# Patient Record
Sex: Female | Born: 1950
Health system: Southern US, Community
[De-identification: ages and names within clinical notes are randomized; demographics above are authoritative.]

## PROBLEM LIST (undated history)

## (undated) DIAGNOSIS — K219 Gastro-esophageal reflux disease without esophagitis: Secondary | ICD-10-CM

## (undated) DIAGNOSIS — K210 Gastro-esophageal reflux disease with esophagitis: Secondary | ICD-10-CM

## (undated) DIAGNOSIS — I21A1 Myocardial infarction type 2: Secondary | ICD-10-CM

## (undated) DIAGNOSIS — E871 Hypo-osmolality and hyponatremia: Secondary | ICD-10-CM

## (undated) DIAGNOSIS — D735 Infarction of spleen: Secondary | ICD-10-CM

## (undated) DIAGNOSIS — E785 Hyperlipidemia, unspecified: Secondary | ICD-10-CM

## (undated) DIAGNOSIS — M545 Low back pain, unspecified: Secondary | ICD-10-CM

## (undated) DIAGNOSIS — R079 Chest pain, unspecified: Secondary | ICD-10-CM

## (undated) DIAGNOSIS — I639 Cerebral infarction, unspecified: Secondary | ICD-10-CM

## (undated) DIAGNOSIS — F419 Anxiety disorder, unspecified: Secondary | ICD-10-CM

## (undated) DIAGNOSIS — I2699 Other pulmonary embolism without acute cor pulmonale: Secondary | ICD-10-CM

## (undated) DIAGNOSIS — I4891 Unspecified atrial fibrillation: Secondary | ICD-10-CM

## (undated) DIAGNOSIS — D649 Anemia, unspecified: Secondary | ICD-10-CM

## (undated) DIAGNOSIS — M25569 Pain in unspecified knee: Secondary | ICD-10-CM

## (undated) DIAGNOSIS — E559 Vitamin D deficiency, unspecified: Secondary | ICD-10-CM

## (undated) DIAGNOSIS — M810 Age-related osteoporosis without current pathological fracture: Secondary | ICD-10-CM

## (undated) DIAGNOSIS — I495 Sick sinus syndrome: Secondary | ICD-10-CM

## (undated) DIAGNOSIS — I63511 Cerebral infarction due to unspecified occlusion or stenosis of right middle cerebral artery: Secondary | ICD-10-CM

## (undated) DIAGNOSIS — E119 Type 2 diabetes mellitus without complications: Secondary | ICD-10-CM

## (undated) DIAGNOSIS — J45909 Unspecified asthma, uncomplicated: Secondary | ICD-10-CM

## (undated) DIAGNOSIS — E1140 Type 2 diabetes mellitus with diabetic neuropathy, unspecified: Secondary | ICD-10-CM

## (undated) HISTORY — DX: Unspecified atrial fibrillation: I48.91

## (undated) HISTORY — DX: Hyperlipidemia, unspecified: E78.5

## (undated) HISTORY — DX: Low back pain, unspecified: M54.50

## (undated) HISTORY — DX: Type 2 diabetes mellitus without complications: E11.9

## (undated) HISTORY — DX: Pain in unspecified knee: M25.569

## (undated) HISTORY — DX: Sick sinus syndrome: I49.5

## (undated) HISTORY — DX: Unspecified asthma, uncomplicated: J45.909

## (undated) HISTORY — DX: Cerebral infarction, unspecified: I63.9

## (undated) HISTORY — DX: Anxiety disorder, unspecified: F41.9

## (undated) HISTORY — PX: HERNIA REPAIR: SHX51

## (undated) HISTORY — DX: Infarction of spleen: D73.5

## (undated) HISTORY — DX: Age-related osteoporosis without current pathological fracture: M81.0

## (undated) HISTORY — DX: Hypo-osmolality and hyponatremia: E87.1

## (undated) HISTORY — DX: Myocardial infarction type 2: I21.A1

## (undated) HISTORY — DX: Other pulmonary embolism without acute cor pulmonale: I26.99

## (undated) HISTORY — DX: Cerebral infarction due to unspecified occlusion or stenosis of right middle cerebral artery: I63.511

## (undated) HISTORY — DX: Low back pain: M54.5

## (undated) HISTORY — DX: Anemia, unspecified: D64.9

## (undated) HISTORY — DX: Gastro-esophageal reflux disease with esophagitis: K21.0

## (undated) HISTORY — DX: Type 2 diabetes mellitus with diabetic neuropathy, unspecified: E11.40

## (undated) HISTORY — DX: Vitamin D deficiency, unspecified: E55.9

## (undated) HISTORY — DX: Chest pain, unspecified: R07.9

## (undated) HISTORY — DX: Gastro-esophageal reflux disease without esophagitis: K21.9

---

## 1991-01-24 HISTORY — PX: ARM AMPUTATION: SUR21

## 2004-08-24 ENCOUNTER — Inpatient Hospital Stay (HOSPITAL_COMMUNITY): Admission: EM | Admit: 2004-08-24 | Discharge: 2004-08-26 | Payer: Self-pay | Admitting: Emergency Medicine

## 2004-08-24 ENCOUNTER — Ambulatory Visit: Payer: Self-pay | Admitting: Cardiovascular Disease

## 2004-09-08 ENCOUNTER — Ambulatory Visit: Payer: Self-pay | Admitting: Oncology

## 2009-02-03 HISTORY — PX: ESOPHAGOGASTRODUODENOSCOPY: SHX1529

## 2013-04-17 DIAGNOSIS — M79609 Pain in unspecified limb: Secondary | ICD-10-CM | POA: Diagnosis not present

## 2013-05-07 DIAGNOSIS — M25569 Pain in unspecified knee: Secondary | ICD-10-CM | POA: Diagnosis not present

## 2013-05-14 DIAGNOSIS — Z13820 Encounter for screening for osteoporosis: Secondary | ICD-10-CM | POA: Diagnosis not present

## 2013-05-14 DIAGNOSIS — M810 Age-related osteoporosis without current pathological fracture: Secondary | ICD-10-CM | POA: Diagnosis not present

## 2013-06-23 DIAGNOSIS — S9030XA Contusion of unspecified foot, initial encounter: Secondary | ICD-10-CM | POA: Diagnosis not present

## 2013-06-23 DIAGNOSIS — I4891 Unspecified atrial fibrillation: Secondary | ICD-10-CM | POA: Diagnosis not present

## 2013-06-23 DIAGNOSIS — E119 Type 2 diabetes mellitus without complications: Secondary | ICD-10-CM | POA: Diagnosis not present

## 2013-06-23 DIAGNOSIS — Z7982 Long term (current) use of aspirin: Secondary | ICD-10-CM | POA: Diagnosis not present

## 2013-06-23 DIAGNOSIS — Z8673 Personal history of transient ischemic attack (TIA), and cerebral infarction without residual deficits: Secondary | ICD-10-CM | POA: Diagnosis not present

## 2013-06-23 DIAGNOSIS — J45909 Unspecified asthma, uncomplicated: Secondary | ICD-10-CM | POA: Diagnosis not present

## 2013-06-23 DIAGNOSIS — K219 Gastro-esophageal reflux disease without esophagitis: Secondary | ICD-10-CM | POA: Diagnosis not present

## 2013-06-23 DIAGNOSIS — I10 Essential (primary) hypertension: Secondary | ICD-10-CM | POA: Diagnosis not present

## 2013-07-22 DIAGNOSIS — M25519 Pain in unspecified shoulder: Secondary | ICD-10-CM | POA: Diagnosis not present

## 2013-09-06 DIAGNOSIS — Z79899 Other long term (current) drug therapy: Secondary | ICD-10-CM | POA: Diagnosis not present

## 2013-09-06 DIAGNOSIS — Z7901 Long term (current) use of anticoagulants: Secondary | ICD-10-CM | POA: Diagnosis not present

## 2013-09-06 DIAGNOSIS — I10 Essential (primary) hypertension: Secondary | ICD-10-CM | POA: Diagnosis not present

## 2013-09-06 DIAGNOSIS — I4891 Unspecified atrial fibrillation: Secondary | ICD-10-CM | POA: Diagnosis not present

## 2013-09-06 DIAGNOSIS — E119 Type 2 diabetes mellitus without complications: Secondary | ICD-10-CM | POA: Diagnosis not present

## 2013-09-06 DIAGNOSIS — Z86718 Personal history of other venous thrombosis and embolism: Secondary | ICD-10-CM | POA: Diagnosis not present

## 2013-09-06 DIAGNOSIS — S68419A Complete traumatic amputation of unspecified hand at wrist level, initial encounter: Secondary | ICD-10-CM | POA: Diagnosis not present

## 2013-09-06 DIAGNOSIS — Z885 Allergy status to narcotic agent status: Secondary | ICD-10-CM | POA: Diagnosis not present

## 2013-09-06 DIAGNOSIS — Z8673 Personal history of transient ischemic attack (TIA), and cerebral infarction without residual deficits: Secondary | ICD-10-CM | POA: Diagnosis not present

## 2013-09-06 DIAGNOSIS — M778 Other enthesopathies, not elsewhere classified: Secondary | ICD-10-CM | POA: Diagnosis not present

## 2013-09-24 DIAGNOSIS — I6350 Cerebral infarction due to unspecified occlusion or stenosis of unspecified cerebral artery: Secondary | ICD-10-CM | POA: Diagnosis not present

## 2013-09-24 DIAGNOSIS — D649 Anemia, unspecified: Secondary | ICD-10-CM | POA: Diagnosis not present

## 2013-09-24 DIAGNOSIS — E119 Type 2 diabetes mellitus without complications: Secondary | ICD-10-CM | POA: Diagnosis not present

## 2013-09-24 DIAGNOSIS — E785 Hyperlipidemia, unspecified: Secondary | ICD-10-CM | POA: Diagnosis not present

## 2013-10-14 DIAGNOSIS — F339 Major depressive disorder, recurrent, unspecified: Secondary | ICD-10-CM | POA: Diagnosis not present

## 2013-11-10 DIAGNOSIS — Z6836 Body mass index (BMI) 36.0-36.9, adult: Secondary | ICD-10-CM | POA: Diagnosis not present

## 2013-11-10 DIAGNOSIS — M25512 Pain in left shoulder: Secondary | ICD-10-CM | POA: Diagnosis not present

## 2013-11-10 DIAGNOSIS — R300 Dysuria: Secondary | ICD-10-CM | POA: Diagnosis not present

## 2013-11-12 DIAGNOSIS — M25512 Pain in left shoulder: Secondary | ICD-10-CM | POA: Diagnosis not present

## 2013-11-17 DIAGNOSIS — M25512 Pain in left shoulder: Secondary | ICD-10-CM | POA: Diagnosis not present

## 2013-11-26 DIAGNOSIS — M7502 Adhesive capsulitis of left shoulder: Secondary | ICD-10-CM | POA: Diagnosis not present

## 2013-12-15 DIAGNOSIS — M7502 Adhesive capsulitis of left shoulder: Secondary | ICD-10-CM | POA: Diagnosis not present

## 2013-12-15 DIAGNOSIS — M25512 Pain in left shoulder: Secondary | ICD-10-CM | POA: Diagnosis not present

## 2013-12-23 DIAGNOSIS — J45902 Unspecified asthma with status asthmaticus: Secondary | ICD-10-CM | POA: Diagnosis not present

## 2013-12-23 DIAGNOSIS — J189 Pneumonia, unspecified organism: Secondary | ICD-10-CM | POA: Diagnosis not present

## 2013-12-23 DIAGNOSIS — M545 Low back pain: Secondary | ICD-10-CM | POA: Diagnosis not present

## 2014-01-12 DIAGNOSIS — I6310 Cerebral infarction due to embolism of unspecified precerebral artery: Secondary | ICD-10-CM | POA: Diagnosis not present

## 2014-01-12 DIAGNOSIS — K210 Gastro-esophageal reflux disease with esophagitis: Secondary | ICD-10-CM | POA: Diagnosis not present

## 2014-01-12 DIAGNOSIS — E1121 Type 2 diabetes mellitus with diabetic nephropathy: Secondary | ICD-10-CM | POA: Diagnosis not present

## 2014-01-12 DIAGNOSIS — E119 Type 2 diabetes mellitus without complications: Secondary | ICD-10-CM | POA: Diagnosis not present

## 2014-01-12 DIAGNOSIS — Z6837 Body mass index (BMI) 37.0-37.9, adult: Secondary | ICD-10-CM | POA: Diagnosis not present

## 2014-01-12 DIAGNOSIS — M25512 Pain in left shoulder: Secondary | ICD-10-CM | POA: Diagnosis not present

## 2014-01-12 DIAGNOSIS — L03211 Cellulitis of face: Secondary | ICD-10-CM | POA: Diagnosis not present

## 2014-01-12 DIAGNOSIS — M545 Low back pain: Secondary | ICD-10-CM | POA: Diagnosis not present

## 2014-01-12 DIAGNOSIS — I10 Essential (primary) hypertension: Secondary | ICD-10-CM | POA: Diagnosis not present

## 2014-01-12 DIAGNOSIS — E785 Hyperlipidemia, unspecified: Secondary | ICD-10-CM | POA: Diagnosis not present

## 2014-01-23 DIAGNOSIS — S5001XA Contusion of right elbow, initial encounter: Secondary | ICD-10-CM | POA: Diagnosis not present

## 2014-01-23 DIAGNOSIS — Z79899 Other long term (current) drug therapy: Secondary | ICD-10-CM | POA: Diagnosis not present

## 2014-01-23 DIAGNOSIS — I10 Essential (primary) hypertension: Secondary | ICD-10-CM | POA: Diagnosis not present

## 2014-01-23 DIAGNOSIS — S59901A Unspecified injury of right elbow, initial encounter: Secondary | ICD-10-CM | POA: Diagnosis not present

## 2014-01-23 DIAGNOSIS — E119 Type 2 diabetes mellitus without complications: Secondary | ICD-10-CM | POA: Diagnosis not present

## 2014-01-23 DIAGNOSIS — Z7982 Long term (current) use of aspirin: Secondary | ICD-10-CM | POA: Diagnosis not present

## 2014-01-23 DIAGNOSIS — S50311A Abrasion of right elbow, initial encounter: Secondary | ICD-10-CM | POA: Diagnosis not present

## 2014-01-27 DIAGNOSIS — I2699 Other pulmonary embolism without acute cor pulmonale: Secondary | ICD-10-CM | POA: Diagnosis not present

## 2014-01-27 DIAGNOSIS — S40819A Abrasion of unspecified upper arm, initial encounter: Secondary | ICD-10-CM | POA: Diagnosis not present

## 2014-04-05 DIAGNOSIS — B029 Zoster without complications: Secondary | ICD-10-CM | POA: Diagnosis not present

## 2014-04-05 DIAGNOSIS — K219 Gastro-esophageal reflux disease without esophagitis: Secondary | ICD-10-CM | POA: Diagnosis not present

## 2014-04-05 DIAGNOSIS — I10 Essential (primary) hypertension: Secondary | ICD-10-CM | POA: Diagnosis not present

## 2014-04-05 DIAGNOSIS — Z79899 Other long term (current) drug therapy: Secondary | ICD-10-CM | POA: Diagnosis not present

## 2014-04-05 DIAGNOSIS — Z8673 Personal history of transient ischemic attack (TIA), and cerebral infarction without residual deficits: Secondary | ICD-10-CM | POA: Diagnosis not present

## 2014-04-05 DIAGNOSIS — E119 Type 2 diabetes mellitus without complications: Secondary | ICD-10-CM | POA: Diagnosis not present

## 2014-04-05 DIAGNOSIS — Z7982 Long term (current) use of aspirin: Secondary | ICD-10-CM | POA: Diagnosis not present

## 2014-04-05 DIAGNOSIS — R21 Rash and other nonspecific skin eruption: Secondary | ICD-10-CM | POA: Diagnosis not present

## 2014-04-05 DIAGNOSIS — F419 Anxiety disorder, unspecified: Secondary | ICD-10-CM | POA: Diagnosis not present

## 2014-04-05 DIAGNOSIS — I4891 Unspecified atrial fibrillation: Secondary | ICD-10-CM | POA: Diagnosis not present

## 2014-04-15 DIAGNOSIS — M79673 Pain in unspecified foot: Secondary | ICD-10-CM | POA: Diagnosis not present

## 2014-04-15 DIAGNOSIS — E1121 Type 2 diabetes mellitus with diabetic nephropathy: Secondary | ICD-10-CM | POA: Diagnosis not present

## 2014-04-15 DIAGNOSIS — E119 Type 2 diabetes mellitus without complications: Secondary | ICD-10-CM | POA: Diagnosis not present

## 2014-04-15 DIAGNOSIS — E785 Hyperlipidemia, unspecified: Secondary | ICD-10-CM | POA: Diagnosis not present

## 2014-04-15 DIAGNOSIS — M25562 Pain in left knee: Secondary | ICD-10-CM | POA: Diagnosis not present

## 2014-04-15 DIAGNOSIS — I2699 Other pulmonary embolism without acute cor pulmonale: Secondary | ICD-10-CM | POA: Diagnosis not present

## 2014-04-15 DIAGNOSIS — F419 Anxiety disorder, unspecified: Secondary | ICD-10-CM | POA: Diagnosis not present

## 2014-04-30 DIAGNOSIS — M109 Gout, unspecified: Secondary | ICD-10-CM | POA: Diagnosis not present

## 2014-07-09 DIAGNOSIS — Z7901 Long term (current) use of anticoagulants: Secondary | ICD-10-CM | POA: Insufficient documentation

## 2014-07-09 DIAGNOSIS — I639 Cerebral infarction, unspecified: Secondary | ICD-10-CM | POA: Insufficient documentation

## 2014-07-09 DIAGNOSIS — R079 Chest pain, unspecified: Secondary | ICD-10-CM | POA: Diagnosis not present

## 2014-07-09 HISTORY — DX: Long term (current) use of anticoagulants: Z79.01

## 2014-07-17 DIAGNOSIS — E119 Type 2 diabetes mellitus without complications: Secondary | ICD-10-CM | POA: Diagnosis not present

## 2014-07-17 DIAGNOSIS — E785 Hyperlipidemia, unspecified: Secondary | ICD-10-CM | POA: Diagnosis not present

## 2014-07-23 DIAGNOSIS — M25552 Pain in left hip: Secondary | ICD-10-CM | POA: Diagnosis not present

## 2014-07-23 DIAGNOSIS — E1165 Type 2 diabetes mellitus with hyperglycemia: Secondary | ICD-10-CM | POA: Diagnosis not present

## 2014-09-23 DIAGNOSIS — E119 Type 2 diabetes mellitus without complications: Secondary | ICD-10-CM | POA: Diagnosis not present

## 2014-09-23 DIAGNOSIS — Z6838 Body mass index (BMI) 38.0-38.9, adult: Secondary | ICD-10-CM | POA: Diagnosis not present

## 2014-09-23 DIAGNOSIS — L600 Ingrowing nail: Secondary | ICD-10-CM | POA: Diagnosis not present

## 2014-10-20 DIAGNOSIS — M79671 Pain in right foot: Secondary | ICD-10-CM | POA: Diagnosis not present

## 2014-10-20 DIAGNOSIS — S300XXA Contusion of lower back and pelvis, initial encounter: Secondary | ICD-10-CM | POA: Diagnosis not present

## 2014-10-20 DIAGNOSIS — S9031XA Contusion of right foot, initial encounter: Secondary | ICD-10-CM | POA: Diagnosis not present

## 2014-10-20 DIAGNOSIS — Z794 Long term (current) use of insulin: Secondary | ICD-10-CM | POA: Diagnosis not present

## 2014-10-20 DIAGNOSIS — S3993XA Unspecified injury of pelvis, initial encounter: Secondary | ICD-10-CM | POA: Diagnosis not present

## 2014-10-20 DIAGNOSIS — Z79899 Other long term (current) drug therapy: Secondary | ICD-10-CM | POA: Diagnosis not present

## 2014-10-20 DIAGNOSIS — S8991XA Unspecified injury of right lower leg, initial encounter: Secondary | ICD-10-CM | POA: Diagnosis not present

## 2014-10-20 DIAGNOSIS — I4891 Unspecified atrial fibrillation: Secondary | ICD-10-CM | POA: Diagnosis not present

## 2014-10-20 DIAGNOSIS — Z7982 Long term (current) use of aspirin: Secondary | ICD-10-CM | POA: Diagnosis not present

## 2014-10-20 DIAGNOSIS — S8001XA Contusion of right knee, initial encounter: Secondary | ICD-10-CM | POA: Diagnosis not present

## 2014-10-20 DIAGNOSIS — I10 Essential (primary) hypertension: Secondary | ICD-10-CM | POA: Diagnosis not present

## 2014-10-20 DIAGNOSIS — E119 Type 2 diabetes mellitus without complications: Secondary | ICD-10-CM | POA: Diagnosis not present

## 2014-10-20 DIAGNOSIS — S99921A Unspecified injury of right foot, initial encounter: Secondary | ICD-10-CM | POA: Diagnosis not present

## 2014-10-20 DIAGNOSIS — M25561 Pain in right knee: Secondary | ICD-10-CM | POA: Diagnosis not present

## 2014-10-23 DIAGNOSIS — M5416 Radiculopathy, lumbar region: Secondary | ICD-10-CM | POA: Diagnosis not present

## 2014-11-18 DIAGNOSIS — E119 Type 2 diabetes mellitus without complications: Secondary | ICD-10-CM | POA: Diagnosis not present

## 2014-11-18 DIAGNOSIS — Z89112 Acquired absence of left hand: Secondary | ICD-10-CM | POA: Diagnosis not present

## 2014-11-18 DIAGNOSIS — Z9851 Tubal ligation status: Secondary | ICD-10-CM | POA: Diagnosis not present

## 2014-11-18 DIAGNOSIS — Z8673 Personal history of transient ischemic attack (TIA), and cerebral infarction without residual deficits: Secondary | ICD-10-CM | POA: Diagnosis not present

## 2014-11-18 DIAGNOSIS — K219 Gastro-esophageal reflux disease without esophagitis: Secondary | ICD-10-CM | POA: Diagnosis not present

## 2014-11-18 DIAGNOSIS — J45909 Unspecified asthma, uncomplicated: Secondary | ICD-10-CM | POA: Diagnosis not present

## 2014-11-18 DIAGNOSIS — I10 Essential (primary) hypertension: Secondary | ICD-10-CM | POA: Diagnosis not present

## 2014-11-18 DIAGNOSIS — F419 Anxiety disorder, unspecified: Secondary | ICD-10-CM | POA: Diagnosis not present

## 2014-11-18 DIAGNOSIS — Z7982 Long term (current) use of aspirin: Secondary | ICD-10-CM | POA: Diagnosis not present

## 2014-11-18 DIAGNOSIS — L309 Dermatitis, unspecified: Secondary | ICD-10-CM | POA: Diagnosis not present

## 2014-11-18 DIAGNOSIS — B029 Zoster without complications: Secondary | ICD-10-CM | POA: Diagnosis not present

## 2014-11-18 DIAGNOSIS — Z794 Long term (current) use of insulin: Secondary | ICD-10-CM | POA: Diagnosis not present

## 2014-11-18 DIAGNOSIS — Z9049 Acquired absence of other specified parts of digestive tract: Secondary | ICD-10-CM | POA: Diagnosis not present

## 2014-11-18 DIAGNOSIS — Z79899 Other long term (current) drug therapy: Secondary | ICD-10-CM | POA: Diagnosis not present

## 2015-03-12 DIAGNOSIS — I639 Cerebral infarction, unspecified: Secondary | ICD-10-CM | POA: Diagnosis not present

## 2015-03-12 DIAGNOSIS — K210 Gastro-esophageal reflux disease with esophagitis: Secondary | ICD-10-CM | POA: Diagnosis not present

## 2015-03-12 DIAGNOSIS — E785 Hyperlipidemia, unspecified: Secondary | ICD-10-CM | POA: Diagnosis not present

## 2015-03-12 DIAGNOSIS — I10 Essential (primary) hypertension: Secondary | ICD-10-CM | POA: Diagnosis not present

## 2015-03-12 DIAGNOSIS — E119 Type 2 diabetes mellitus without complications: Secondary | ICD-10-CM | POA: Diagnosis not present

## 2015-03-12 DIAGNOSIS — M818 Other osteoporosis without current pathological fracture: Secondary | ICD-10-CM | POA: Diagnosis not present

## 2015-06-02 DIAGNOSIS — R197 Diarrhea, unspecified: Secondary | ICD-10-CM | POA: Diagnosis not present

## 2015-06-07 DIAGNOSIS — R197 Diarrhea, unspecified: Secondary | ICD-10-CM | POA: Diagnosis not present

## 2015-06-08 DIAGNOSIS — Z7982 Long term (current) use of aspirin: Secondary | ICD-10-CM | POA: Diagnosis not present

## 2015-06-08 DIAGNOSIS — G8929 Other chronic pain: Secondary | ICD-10-CM | POA: Diagnosis not present

## 2015-06-08 DIAGNOSIS — J45909 Unspecified asthma, uncomplicated: Secondary | ICD-10-CM | POA: Diagnosis not present

## 2015-06-08 DIAGNOSIS — M545 Low back pain: Secondary | ICD-10-CM | POA: Diagnosis not present

## 2015-06-08 DIAGNOSIS — E119 Type 2 diabetes mellitus without complications: Secondary | ICD-10-CM | POA: Diagnosis not present

## 2015-06-08 DIAGNOSIS — I10 Essential (primary) hypertension: Secondary | ICD-10-CM | POA: Diagnosis not present

## 2015-06-08 DIAGNOSIS — Z79899 Other long term (current) drug therapy: Secondary | ICD-10-CM | POA: Diagnosis not present

## 2015-06-08 DIAGNOSIS — Z7984 Long term (current) use of oral hypoglycemic drugs: Secondary | ICD-10-CM | POA: Diagnosis not present

## 2015-06-08 DIAGNOSIS — Z720 Tobacco use: Secondary | ICD-10-CM | POA: Diagnosis not present

## 2015-06-08 DIAGNOSIS — Z8673 Personal history of transient ischemic attack (TIA), and cerebral infarction without residual deficits: Secondary | ICD-10-CM | POA: Diagnosis not present

## 2015-06-08 DIAGNOSIS — Z794 Long term (current) use of insulin: Secondary | ICD-10-CM | POA: Diagnosis not present

## 2015-06-11 DIAGNOSIS — I2699 Other pulmonary embolism without acute cor pulmonale: Secondary | ICD-10-CM | POA: Diagnosis not present

## 2015-06-11 DIAGNOSIS — K210 Gastro-esophageal reflux disease with esophagitis: Secondary | ICD-10-CM | POA: Diagnosis not present

## 2015-06-11 DIAGNOSIS — E119 Type 2 diabetes mellitus without complications: Secondary | ICD-10-CM | POA: Diagnosis not present

## 2015-06-11 DIAGNOSIS — E1121 Type 2 diabetes mellitus with diabetic nephropathy: Secondary | ICD-10-CM | POA: Diagnosis not present

## 2015-06-11 DIAGNOSIS — E785 Hyperlipidemia, unspecified: Secondary | ICD-10-CM | POA: Diagnosis not present

## 2015-06-11 DIAGNOSIS — E1169 Type 2 diabetes mellitus with other specified complication: Secondary | ICD-10-CM | POA: Diagnosis not present

## 2015-10-13 DIAGNOSIS — J45902 Unspecified asthma with status asthmaticus: Secondary | ICD-10-CM | POA: Diagnosis not present

## 2015-10-13 DIAGNOSIS — I2699 Other pulmonary embolism without acute cor pulmonale: Secondary | ICD-10-CM | POA: Diagnosis not present

## 2015-10-13 DIAGNOSIS — E1169 Type 2 diabetes mellitus with other specified complication: Secondary | ICD-10-CM | POA: Diagnosis not present

## 2015-10-13 DIAGNOSIS — M545 Low back pain: Secondary | ICD-10-CM | POA: Diagnosis not present

## 2015-10-13 DIAGNOSIS — B349 Viral infection, unspecified: Secondary | ICD-10-CM | POA: Diagnosis not present

## 2015-10-22 DIAGNOSIS — E1121 Type 2 diabetes mellitus with diabetic nephropathy: Secondary | ICD-10-CM | POA: Diagnosis not present

## 2015-10-22 DIAGNOSIS — J45902 Unspecified asthma with status asthmaticus: Secondary | ICD-10-CM | POA: Diagnosis not present

## 2015-10-22 DIAGNOSIS — E785 Hyperlipidemia, unspecified: Secondary | ICD-10-CM | POA: Diagnosis not present

## 2015-10-22 DIAGNOSIS — I639 Cerebral infarction, unspecified: Secondary | ICD-10-CM | POA: Diagnosis not present

## 2015-12-06 DIAGNOSIS — R197 Diarrhea, unspecified: Secondary | ICD-10-CM | POA: Diagnosis not present

## 2016-01-28 DIAGNOSIS — I2699 Other pulmonary embolism without acute cor pulmonale: Secondary | ICD-10-CM | POA: Diagnosis not present

## 2016-01-28 DIAGNOSIS — I10 Essential (primary) hypertension: Secondary | ICD-10-CM | POA: Diagnosis not present

## 2016-01-28 DIAGNOSIS — M545 Low back pain: Secondary | ICD-10-CM | POA: Diagnosis not present

## 2016-01-28 DIAGNOSIS — K210 Gastro-esophageal reflux disease with esophagitis: Secondary | ICD-10-CM | POA: Diagnosis not present

## 2016-01-28 DIAGNOSIS — E785 Hyperlipidemia, unspecified: Secondary | ICD-10-CM | POA: Diagnosis not present

## 2016-01-28 DIAGNOSIS — E1169 Type 2 diabetes mellitus with other specified complication: Secondary | ICD-10-CM | POA: Diagnosis not present

## 2016-01-28 DIAGNOSIS — Z79899 Other long term (current) drug therapy: Secondary | ICD-10-CM | POA: Diagnosis not present

## 2016-01-28 DIAGNOSIS — D649 Anemia, unspecified: Secondary | ICD-10-CM | POA: Diagnosis not present

## 2016-02-24 DIAGNOSIS — I639 Cerebral infarction, unspecified: Secondary | ICD-10-CM | POA: Diagnosis not present

## 2016-02-24 DIAGNOSIS — E1121 Type 2 diabetes mellitus with diabetic nephropathy: Secondary | ICD-10-CM | POA: Diagnosis not present

## 2016-02-24 DIAGNOSIS — I10 Essential (primary) hypertension: Secondary | ICD-10-CM | POA: Diagnosis not present

## 2016-02-24 DIAGNOSIS — R6889 Other general symptoms and signs: Secondary | ICD-10-CM | POA: Diagnosis not present

## 2016-02-24 DIAGNOSIS — D649 Anemia, unspecified: Secondary | ICD-10-CM | POA: Diagnosis not present

## 2016-03-10 DIAGNOSIS — I10 Essential (primary) hypertension: Secondary | ICD-10-CM | POA: Diagnosis not present

## 2016-04-20 DIAGNOSIS — S8002XA Contusion of left knee, initial encounter: Secondary | ICD-10-CM | POA: Diagnosis not present

## 2016-04-20 DIAGNOSIS — M7989 Other specified soft tissue disorders: Secondary | ICD-10-CM | POA: Diagnosis not present

## 2016-04-20 DIAGNOSIS — K219 Gastro-esophageal reflux disease without esophagitis: Secondary | ICD-10-CM | POA: Diagnosis not present

## 2016-04-20 DIAGNOSIS — J45909 Unspecified asthma, uncomplicated: Secondary | ICD-10-CM | POA: Diagnosis not present

## 2016-04-20 DIAGNOSIS — I10 Essential (primary) hypertension: Secondary | ICD-10-CM | POA: Diagnosis not present

## 2016-04-20 DIAGNOSIS — Z7901 Long term (current) use of anticoagulants: Secondary | ICD-10-CM | POA: Diagnosis not present

## 2016-04-20 DIAGNOSIS — E119 Type 2 diabetes mellitus without complications: Secondary | ICD-10-CM | POA: Diagnosis not present

## 2016-04-20 DIAGNOSIS — Z7984 Long term (current) use of oral hypoglycemic drugs: Secondary | ICD-10-CM | POA: Diagnosis not present

## 2016-04-20 DIAGNOSIS — Z79899 Other long term (current) drug therapy: Secondary | ICD-10-CM | POA: Diagnosis not present

## 2016-04-20 DIAGNOSIS — S8992XA Unspecified injury of left lower leg, initial encounter: Secondary | ICD-10-CM | POA: Diagnosis not present

## 2016-04-20 DIAGNOSIS — I2510 Atherosclerotic heart disease of native coronary artery without angina pectoris: Secondary | ICD-10-CM | POA: Diagnosis not present

## 2016-04-23 DIAGNOSIS — I2510 Atherosclerotic heart disease of native coronary artery without angina pectoris: Secondary | ICD-10-CM | POA: Diagnosis not present

## 2016-04-23 DIAGNOSIS — I10 Essential (primary) hypertension: Secondary | ICD-10-CM | POA: Diagnosis not present

## 2016-04-23 DIAGNOSIS — K219 Gastro-esophageal reflux disease without esophagitis: Secondary | ICD-10-CM | POA: Diagnosis not present

## 2016-04-23 DIAGNOSIS — E119 Type 2 diabetes mellitus without complications: Secondary | ICD-10-CM | POA: Diagnosis not present

## 2016-04-23 DIAGNOSIS — S8012XA Contusion of left lower leg, initial encounter: Secondary | ICD-10-CM | POA: Diagnosis not present

## 2016-04-23 DIAGNOSIS — M25562 Pain in left knee: Secondary | ICD-10-CM | POA: Diagnosis not present

## 2016-04-23 DIAGNOSIS — Z79899 Other long term (current) drug therapy: Secondary | ICD-10-CM | POA: Diagnosis not present

## 2016-04-23 DIAGNOSIS — Z8673 Personal history of transient ischemic attack (TIA), and cerebral infarction without residual deficits: Secondary | ICD-10-CM | POA: Diagnosis not present

## 2016-04-23 DIAGNOSIS — Z7901 Long term (current) use of anticoagulants: Secondary | ICD-10-CM | POA: Diagnosis not present

## 2016-04-23 DIAGNOSIS — M7989 Other specified soft tissue disorders: Secondary | ICD-10-CM | POA: Diagnosis not present

## 2016-04-23 DIAGNOSIS — Z043 Encounter for examination and observation following other accident: Secondary | ICD-10-CM | POA: Diagnosis not present

## 2016-04-23 DIAGNOSIS — Z794 Long term (current) use of insulin: Secondary | ICD-10-CM | POA: Diagnosis not present

## 2016-04-23 DIAGNOSIS — S8002XA Contusion of left knee, initial encounter: Secondary | ICD-10-CM | POA: Diagnosis not present

## 2016-05-02 DIAGNOSIS — D649 Anemia, unspecified: Secondary | ICD-10-CM | POA: Diagnosis not present

## 2016-05-02 DIAGNOSIS — E119 Type 2 diabetes mellitus without complications: Secondary | ICD-10-CM | POA: Diagnosis not present

## 2016-05-02 DIAGNOSIS — M545 Low back pain: Secondary | ICD-10-CM | POA: Diagnosis not present

## 2016-05-02 DIAGNOSIS — E785 Hyperlipidemia, unspecified: Secondary | ICD-10-CM | POA: Diagnosis not present

## 2016-05-02 DIAGNOSIS — Z79899 Other long term (current) drug therapy: Secondary | ICD-10-CM | POA: Diagnosis not present

## 2016-05-02 DIAGNOSIS — I10 Essential (primary) hypertension: Secondary | ICD-10-CM | POA: Diagnosis not present

## 2016-05-02 DIAGNOSIS — M818 Other osteoporosis without current pathological fracture: Secondary | ICD-10-CM | POA: Diagnosis not present

## 2016-05-05 DIAGNOSIS — M7989 Other specified soft tissue disorders: Secondary | ICD-10-CM | POA: Diagnosis not present

## 2016-05-05 DIAGNOSIS — L03116 Cellulitis of left lower limb: Secondary | ICD-10-CM | POA: Diagnosis not present

## 2016-05-05 DIAGNOSIS — M1712 Unilateral primary osteoarthritis, left knee: Secondary | ICD-10-CM | POA: Diagnosis not present

## 2016-05-15 DIAGNOSIS — S8012XA Contusion of left lower leg, initial encounter: Secondary | ICD-10-CM | POA: Diagnosis not present

## 2016-05-22 DIAGNOSIS — S8992XA Unspecified injury of left lower leg, initial encounter: Secondary | ICD-10-CM | POA: Diagnosis not present

## 2016-05-22 DIAGNOSIS — M25462 Effusion, left knee: Secondary | ICD-10-CM | POA: Diagnosis not present

## 2016-05-22 DIAGNOSIS — M1712 Unilateral primary osteoarthritis, left knee: Secondary | ICD-10-CM | POA: Diagnosis not present

## 2016-05-22 DIAGNOSIS — M79662 Pain in left lower leg: Secondary | ICD-10-CM | POA: Diagnosis not present

## 2016-05-22 DIAGNOSIS — M9420 Chondromalacia, unspecified site: Secondary | ICD-10-CM | POA: Diagnosis not present

## 2016-05-25 DIAGNOSIS — S8012XD Contusion of left lower leg, subsequent encounter: Secondary | ICD-10-CM | POA: Diagnosis not present

## 2016-06-05 DIAGNOSIS — S8012XD Contusion of left lower leg, subsequent encounter: Secondary | ICD-10-CM | POA: Diagnosis not present

## 2016-06-09 DIAGNOSIS — S8012XD Contusion of left lower leg, subsequent encounter: Secondary | ICD-10-CM | POA: Diagnosis not present

## 2016-06-09 DIAGNOSIS — S8012XA Contusion of left lower leg, initial encounter: Secondary | ICD-10-CM | POA: Diagnosis not present

## 2016-06-12 DIAGNOSIS — Z789 Other specified health status: Secondary | ICD-10-CM | POA: Diagnosis not present

## 2016-06-12 DIAGNOSIS — M545 Low back pain: Secondary | ICD-10-CM | POA: Diagnosis not present

## 2016-06-12 DIAGNOSIS — S8012XD Contusion of left lower leg, subsequent encounter: Secondary | ICD-10-CM | POA: Diagnosis not present

## 2016-06-14 DIAGNOSIS — M549 Dorsalgia, unspecified: Secondary | ICD-10-CM | POA: Diagnosis not present

## 2016-06-20 DIAGNOSIS — M5416 Radiculopathy, lumbar region: Secondary | ICD-10-CM | POA: Diagnosis not present

## 2016-06-20 DIAGNOSIS — M545 Low back pain: Secondary | ICD-10-CM | POA: Diagnosis not present

## 2016-07-06 DIAGNOSIS — M25552 Pain in left hip: Secondary | ICD-10-CM | POA: Diagnosis not present

## 2016-07-06 DIAGNOSIS — M545 Low back pain: Secondary | ICD-10-CM | POA: Diagnosis not present

## 2016-08-03 DIAGNOSIS — M545 Low back pain: Secondary | ICD-10-CM | POA: Diagnosis not present

## 2016-08-03 DIAGNOSIS — I10 Essential (primary) hypertension: Secondary | ICD-10-CM | POA: Diagnosis not present

## 2016-08-03 DIAGNOSIS — Z9181 History of falling: Secondary | ICD-10-CM | POA: Diagnosis not present

## 2016-08-03 DIAGNOSIS — I639 Cerebral infarction, unspecified: Secondary | ICD-10-CM | POA: Diagnosis not present

## 2016-08-03 DIAGNOSIS — E785 Hyperlipidemia, unspecified: Secondary | ICD-10-CM | POA: Diagnosis not present

## 2016-08-03 DIAGNOSIS — D649 Anemia, unspecified: Secondary | ICD-10-CM | POA: Diagnosis not present

## 2016-08-03 DIAGNOSIS — E559 Vitamin D deficiency, unspecified: Secondary | ICD-10-CM | POA: Diagnosis not present

## 2016-08-03 DIAGNOSIS — E119 Type 2 diabetes mellitus without complications: Secondary | ICD-10-CM | POA: Diagnosis not present

## 2016-10-02 DIAGNOSIS — J019 Acute sinusitis, unspecified: Secondary | ICD-10-CM | POA: Diagnosis not present

## 2016-10-30 DIAGNOSIS — M545 Low back pain: Secondary | ICD-10-CM | POA: Diagnosis not present

## 2016-11-18 DIAGNOSIS — Z8673 Personal history of transient ischemic attack (TIA), and cerebral infarction without residual deficits: Secondary | ICD-10-CM | POA: Diagnosis not present

## 2016-11-18 DIAGNOSIS — R300 Dysuria: Secondary | ICD-10-CM | POA: Diagnosis not present

## 2016-11-18 DIAGNOSIS — R110 Nausea: Secondary | ICD-10-CM | POA: Diagnosis not present

## 2016-11-18 DIAGNOSIS — Z7901 Long term (current) use of anticoagulants: Secondary | ICD-10-CM | POA: Diagnosis not present

## 2016-11-18 DIAGNOSIS — K219 Gastro-esophageal reflux disease without esophagitis: Secondary | ICD-10-CM | POA: Diagnosis not present

## 2016-11-18 DIAGNOSIS — E119 Type 2 diabetes mellitus without complications: Secondary | ICD-10-CM | POA: Diagnosis not present

## 2016-11-18 DIAGNOSIS — Z7982 Long term (current) use of aspirin: Secondary | ICD-10-CM | POA: Diagnosis not present

## 2016-11-18 DIAGNOSIS — Z7984 Long term (current) use of oral hypoglycemic drugs: Secondary | ICD-10-CM | POA: Diagnosis not present

## 2016-11-18 DIAGNOSIS — I2510 Atherosclerotic heart disease of native coronary artery without angina pectoris: Secondary | ICD-10-CM | POA: Diagnosis not present

## 2016-11-18 DIAGNOSIS — J45909 Unspecified asthma, uncomplicated: Secondary | ICD-10-CM | POA: Diagnosis not present

## 2016-11-18 DIAGNOSIS — Z79899 Other long term (current) drug therapy: Secondary | ICD-10-CM | POA: Diagnosis not present

## 2016-11-18 DIAGNOSIS — I10 Essential (primary) hypertension: Secondary | ICD-10-CM | POA: Diagnosis not present

## 2016-11-18 DIAGNOSIS — R109 Unspecified abdominal pain: Secondary | ICD-10-CM | POA: Diagnosis not present

## 2016-11-18 DIAGNOSIS — R1011 Right upper quadrant pain: Secondary | ICD-10-CM | POA: Diagnosis not present

## 2016-11-18 DIAGNOSIS — Z86711 Personal history of pulmonary embolism: Secondary | ICD-10-CM | POA: Diagnosis not present

## 2016-11-19 DIAGNOSIS — R109 Unspecified abdominal pain: Secondary | ICD-10-CM | POA: Diagnosis not present

## 2016-11-23 DIAGNOSIS — K210 Gastro-esophageal reflux disease with esophagitis: Secondary | ICD-10-CM | POA: Diagnosis not present

## 2016-11-23 DIAGNOSIS — E785 Hyperlipidemia, unspecified: Secondary | ICD-10-CM | POA: Diagnosis not present

## 2016-11-23 DIAGNOSIS — Z1331 Encounter for screening for depression: Secondary | ICD-10-CM | POA: Diagnosis not present

## 2016-11-23 DIAGNOSIS — R1031 Right lower quadrant pain: Secondary | ICD-10-CM | POA: Diagnosis not present

## 2016-11-23 DIAGNOSIS — E119 Type 2 diabetes mellitus without complications: Secondary | ICD-10-CM | POA: Diagnosis not present

## 2016-12-16 DIAGNOSIS — Z7982 Long term (current) use of aspirin: Secondary | ICD-10-CM | POA: Diagnosis not present

## 2016-12-16 DIAGNOSIS — Z7901 Long term (current) use of anticoagulants: Secondary | ICD-10-CM | POA: Diagnosis not present

## 2016-12-16 DIAGNOSIS — J45909 Unspecified asthma, uncomplicated: Secondary | ICD-10-CM | POA: Diagnosis not present

## 2016-12-16 DIAGNOSIS — J069 Acute upper respiratory infection, unspecified: Secondary | ICD-10-CM | POA: Diagnosis not present

## 2016-12-16 DIAGNOSIS — Z8673 Personal history of transient ischemic attack (TIA), and cerebral infarction without residual deficits: Secondary | ICD-10-CM | POA: Diagnosis not present

## 2016-12-16 DIAGNOSIS — Z79899 Other long term (current) drug therapy: Secondary | ICD-10-CM | POA: Diagnosis not present

## 2016-12-16 DIAGNOSIS — K219 Gastro-esophageal reflux disease without esophagitis: Secondary | ICD-10-CM | POA: Diagnosis not present

## 2016-12-16 DIAGNOSIS — Z794 Long term (current) use of insulin: Secondary | ICD-10-CM | POA: Diagnosis not present

## 2016-12-16 DIAGNOSIS — Z86711 Personal history of pulmonary embolism: Secondary | ICD-10-CM | POA: Diagnosis not present

## 2016-12-16 DIAGNOSIS — I10 Essential (primary) hypertension: Secondary | ICD-10-CM | POA: Diagnosis not present

## 2016-12-16 DIAGNOSIS — E119 Type 2 diabetes mellitus without complications: Secondary | ICD-10-CM | POA: Diagnosis not present

## 2016-12-28 DIAGNOSIS — R1013 Epigastric pain: Secondary | ICD-10-CM | POA: Diagnosis not present

## 2017-02-02 DIAGNOSIS — R1013 Epigastric pain: Secondary | ICD-10-CM | POA: Diagnosis not present

## 2017-03-01 DIAGNOSIS — M818 Other osteoporosis without current pathological fracture: Secondary | ICD-10-CM | POA: Diagnosis not present

## 2017-03-01 DIAGNOSIS — I10 Essential (primary) hypertension: Secondary | ICD-10-CM | POA: Diagnosis not present

## 2017-03-01 DIAGNOSIS — D649 Anemia, unspecified: Secondary | ICD-10-CM | POA: Diagnosis not present

## 2017-03-01 DIAGNOSIS — E785 Hyperlipidemia, unspecified: Secondary | ICD-10-CM | POA: Diagnosis not present

## 2017-03-01 DIAGNOSIS — M545 Low back pain: Secondary | ICD-10-CM | POA: Diagnosis not present

## 2017-03-01 DIAGNOSIS — E119 Type 2 diabetes mellitus without complications: Secondary | ICD-10-CM | POA: Diagnosis not present

## 2017-05-28 DIAGNOSIS — N950 Postmenopausal bleeding: Secondary | ICD-10-CM | POA: Diagnosis not present

## 2017-05-31 DIAGNOSIS — L03113 Cellulitis of right upper limb: Secondary | ICD-10-CM | POA: Diagnosis not present

## 2017-06-07 ENCOUNTER — Other Ambulatory Visit: Payer: Self-pay

## 2017-06-07 NOTE — Patient Outreach (Signed)
[facility] [facility]) Care Management  06/07/2017  Jennifer Lucero January 04, 1951 [id number]   Medication Adherence call to Mrs. Jennifer Lucero spoke with patient she is past due on Pravastatin 20 mg she is only taking 1/2 tablet instead of 1 tablet a day per doctor order she said her lab works have been good and will continued taking 1/2 tablet. call doctors office there instruction are 1 tablet daily but will clarify and will call back  [place] Management Direct Dial [phone]  Fax [phone] [email or web address]

## 2017-06-14 DIAGNOSIS — K210 Gastro-esophageal reflux disease with esophagitis: Secondary | ICD-10-CM | POA: Diagnosis not present

## 2017-06-14 DIAGNOSIS — D649 Anemia, unspecified: Secondary | ICD-10-CM | POA: Diagnosis not present

## 2017-06-14 DIAGNOSIS — I10 Essential (primary) hypertension: Secondary | ICD-10-CM | POA: Diagnosis not present

## 2017-06-14 DIAGNOSIS — E118 Type 2 diabetes mellitus with unspecified complications: Secondary | ICD-10-CM | POA: Diagnosis not present

## 2017-06-14 DIAGNOSIS — R079 Chest pain, unspecified: Secondary | ICD-10-CM | POA: Diagnosis not present

## 2017-06-14 DIAGNOSIS — E785 Hyperlipidemia, unspecified: Secondary | ICD-10-CM | POA: Diagnosis not present

## 2017-06-15 DIAGNOSIS — K2100 Gastro-esophageal reflux disease with esophagitis, without bleeding: Secondary | ICD-10-CM

## 2017-06-15 DIAGNOSIS — E1140 Type 2 diabetes mellitus with diabetic neuropathy, unspecified: Secondary | ICD-10-CM

## 2017-06-15 DIAGNOSIS — E785 Hyperlipidemia, unspecified: Secondary | ICD-10-CM

## 2017-06-15 DIAGNOSIS — J45909 Unspecified asthma, uncomplicated: Secondary | ICD-10-CM | POA: Insufficient documentation

## 2017-06-15 DIAGNOSIS — R079 Chest pain, unspecified: Secondary | ICD-10-CM

## 2017-06-15 DIAGNOSIS — I2699 Other pulmonary embolism without acute cor pulmonale: Secondary | ICD-10-CM | POA: Insufficient documentation

## 2017-06-15 DIAGNOSIS — M810 Age-related osteoporosis without current pathological fracture: Secondary | ICD-10-CM | POA: Insufficient documentation

## 2017-06-15 DIAGNOSIS — I639 Cerebral infarction, unspecified: Secondary | ICD-10-CM

## 2017-06-15 DIAGNOSIS — F419 Anxiety disorder, unspecified: Secondary | ICD-10-CM | POA: Insufficient documentation

## 2017-06-15 HISTORY — DX: Cerebral infarction, unspecified: I63.9

## 2017-06-15 HISTORY — DX: Age-related osteoporosis without current pathological fracture: M81.0

## 2017-06-15 HISTORY — DX: Anxiety disorder, unspecified: F41.9

## 2017-06-15 HISTORY — DX: Other pulmonary embolism without acute cor pulmonale: I26.99

## 2017-06-15 HISTORY — DX: Hyperlipidemia, unspecified: E78.5

## 2017-06-15 HISTORY — DX: Chest pain, unspecified: R07.9

## 2017-06-15 HISTORY — DX: Type 2 diabetes mellitus with diabetic neuropathy, unspecified: E11.40

## 2017-06-15 HISTORY — DX: Unspecified asthma, uncomplicated: J45.909

## 2017-06-15 HISTORY — DX: Gastro-esophageal reflux disease with esophagitis, without bleeding: K21.00

## 2017-06-26 ENCOUNTER — Ambulatory Visit: Payer: Self-pay | Admitting: Cardiology

## 2017-07-04 NOTE — Progress Notes (Deleted)
Cardiology Office Note:    Date:  07/04/2017   ID:  Jennifer Lucero, DOB 01/30/1950, MRN [id number]  PCP:  Ocie Doyne., MD  Cardiologist:  Shirlee More, MD   Referring MD: Ocie Doyne., MD  ASSESSMENT:    No diagnosis found. PLAN:    In order of problems listed above:  1. ***  Next appointment   Medication Adjustments/Labs and Tests Ordered: Current medicines are reviewed at length with the patient today.  Concerns regarding medicines are outlined above.  No orders of the defined types were placed in this encounter.  No orders of the defined types were placed in this encounter.    No chief complaint on file. ***  History of Present Illness:    Jennifer Lucero is a 67 y.o. female with chronic anticoagulation for VTE and PE, chest pain with previous normal coronary arteriography and normal myocardial perfusion study in July 2014 and stroke who is being seen today for the evaluation of *** at the request of Ocie Doyne., MD.   Past Medical History:  Diagnosis Date  . Anxiety 06/15/2017  . Asthma 06/15/2017  . Chest pain 06/15/2017  . Diabetic neuropathy ([place]) 06/15/2017  . Esophagitis, reflux 06/15/2017  . Hyperlipidemia 06/15/2017  . Osteoporosis 06/15/2017  . Pulmonary embolism ([place]) 06/15/2017  . Stroke [facility]) 06/15/2017    *** The histories are not reviewed yet. Please review them in the "History" navigator section and refresh this [place].  Current Medications: No outpatient medications have been marked as taking for the 07/05/17 encounter (Appointment) with Richardo Priest, MD.     Allergies:   Buspar [buspirone]; Ciprofloxacin-fluocinolone pf; and Codeine   Social History   Socioeconomic History  . Marital status: Married    Spouse name: Not on file  . Number of children: Not on file  . Years of education: Not on file  . Highest education level: Not on file  Occupational History  . Not on file  Social Needs  . Financial resource strain: Not on file    . Food insecurity:    Worry: Not on file    Inability: Not on file  . Transportation needs:    Medical: Not on file    Non-medical: Not on file  Tobacco Use  . Smoking status: Former Research scientist (life sciences)  . Smokeless tobacco: Never Used  . Tobacco comment: quit 32 years ago  Substance and Sexual Activity  . Alcohol use: Not Currently  . Drug use: Not Currently  . Sexual activity: Not on file  Lifestyle  . Physical activity:    Days per week: Not on file    Minutes per session: Not on file  . Stress: Not on file  Relationships  . Social connections:    Talks on phone: Not on file    Gets together: Not on file    Attends religious service: Not on file    Active member of club or organization: Not on file    Attends meetings of clubs or organizations: Not on file    Relationship status: Not on file  Other Topics Concern  . Not on file  Social History Narrative  . Not on file     Family History: The patient's ***family history includes Diabetes in her brother; Heart disease in her mother.  ROS:   ROS Please see the history of present illness.    *** All other systems reviewed and are negative.  EKGs/Labs/Other Studies Reviewed:    The following  studies were reviewed today: ***  EKG:  EKG is *** ordered today.  The ekg ordered today demonstrates ***  Recent Labs: No results found for requested labs within last 8760 hours.  Recent Lipid Panel No results found for: CHOL, TRIG, HDL, CHOLHDL, VLDL, LDLCALC, LDLDIRECT  Physical Exam:    VS:  There were no vitals taken for this visit.    Wt Readings from Last 3 Encounters:  No data found for Wt     GEN: *** Well nourished, well developed in no acute distress HEENT: Normal NECK: No JVD; No carotid bruits LYMPHATICS: No lymphadenopathy CARDIAC: ***RRR, no murmurs, rubs, gallops RESPIRATORY:  Clear to auscultation without rales, wheezing or rhonchi  ABDOMEN: Soft, non-tender, non-distended MUSCULOSKELETAL:  No edema; No  deformity  SKIN: Warm and dry NEUROLOGIC:  Alert and oriented x 3 PSYCHIATRIC:  Normal affect     Signed, Shirlee More, MD  07/04/2017 7:54 AM    [place]

## 2017-07-05 ENCOUNTER — Ambulatory Visit: Payer: Self-pay | Admitting: Cardiology

## 2017-07-05 DIAGNOSIS — Z1339 Encounter for screening examination for other mental health and behavioral disorders: Secondary | ICD-10-CM | POA: Diagnosis not present

## 2017-07-05 DIAGNOSIS — L02214 Cutaneous abscess of groin: Secondary | ICD-10-CM | POA: Diagnosis not present

## 2017-08-04 DIAGNOSIS — Z79899 Other long term (current) drug therapy: Secondary | ICD-10-CM | POA: Diagnosis not present

## 2017-08-04 DIAGNOSIS — S93402A Sprain of unspecified ligament of left ankle, initial encounter: Secondary | ICD-10-CM | POA: Diagnosis not present

## 2017-08-04 DIAGNOSIS — M25572 Pain in left ankle and joints of left foot: Secondary | ICD-10-CM | POA: Diagnosis not present

## 2017-08-04 DIAGNOSIS — Z86718 Personal history of other venous thrombosis and embolism: Secondary | ICD-10-CM | POA: Diagnosis not present

## 2017-08-04 DIAGNOSIS — Z8673 Personal history of transient ischemic attack (TIA), and cerebral infarction without residual deficits: Secondary | ICD-10-CM | POA: Diagnosis not present

## 2017-08-04 DIAGNOSIS — S8002XA Contusion of left knee, initial encounter: Secondary | ICD-10-CM | POA: Diagnosis not present

## 2017-08-04 DIAGNOSIS — M25562 Pain in left knee: Secondary | ICD-10-CM | POA: Diagnosis not present

## 2017-08-04 DIAGNOSIS — E119 Type 2 diabetes mellitus without complications: Secondary | ICD-10-CM | POA: Diagnosis not present

## 2017-08-04 DIAGNOSIS — S8982XA Other specified injuries of left lower leg, initial encounter: Secondary | ICD-10-CM | POA: Diagnosis not present

## 2017-08-04 DIAGNOSIS — Z7901 Long term (current) use of anticoagulants: Secondary | ICD-10-CM | POA: Diagnosis not present

## 2017-08-04 DIAGNOSIS — I10 Essential (primary) hypertension: Secondary | ICD-10-CM | POA: Diagnosis not present

## 2017-08-04 DIAGNOSIS — K219 Gastro-esophageal reflux disease without esophagitis: Secondary | ICD-10-CM | POA: Diagnosis not present

## 2017-08-04 DIAGNOSIS — Z7951 Long term (current) use of inhaled steroids: Secondary | ICD-10-CM | POA: Diagnosis not present

## 2017-08-04 DIAGNOSIS — J45909 Unspecified asthma, uncomplicated: Secondary | ICD-10-CM | POA: Diagnosis not present

## 2017-08-04 DIAGNOSIS — M25462 Effusion, left knee: Secondary | ICD-10-CM | POA: Diagnosis not present

## 2017-08-04 DIAGNOSIS — Z7982 Long term (current) use of aspirin: Secondary | ICD-10-CM | POA: Diagnosis not present

## 2017-08-04 DIAGNOSIS — Z86711 Personal history of pulmonary embolism: Secondary | ICD-10-CM | POA: Diagnosis not present

## 2017-08-04 DIAGNOSIS — Z794 Long term (current) use of insulin: Secondary | ICD-10-CM | POA: Diagnosis not present

## 2017-08-04 DIAGNOSIS — S8992XA Unspecified injury of left lower leg, initial encounter: Secondary | ICD-10-CM | POA: Diagnosis not present

## 2017-08-13 DIAGNOSIS — S99912A Unspecified injury of left ankle, initial encounter: Secondary | ICD-10-CM | POA: Diagnosis not present

## 2017-08-13 DIAGNOSIS — S93402A Sprain of unspecified ligament of left ankle, initial encounter: Secondary | ICD-10-CM | POA: Diagnosis not present

## 2017-08-13 DIAGNOSIS — M79602 Pain in left arm: Secondary | ICD-10-CM | POA: Diagnosis not present

## 2017-08-13 DIAGNOSIS — M79662 Pain in left lower leg: Secondary | ICD-10-CM | POA: Diagnosis not present

## 2017-08-13 DIAGNOSIS — S8992XA Unspecified injury of left lower leg, initial encounter: Secondary | ICD-10-CM | POA: Diagnosis not present

## 2017-08-13 DIAGNOSIS — R42 Dizziness and giddiness: Secondary | ICD-10-CM | POA: Diagnosis not present

## 2017-08-13 DIAGNOSIS — S59912A Unspecified injury of left forearm, initial encounter: Secondary | ICD-10-CM | POA: Diagnosis not present

## 2017-08-13 DIAGNOSIS — M79605 Pain in left leg: Secondary | ICD-10-CM | POA: Diagnosis not present

## 2017-08-13 DIAGNOSIS — M25572 Pain in left ankle and joints of left foot: Secondary | ICD-10-CM | POA: Diagnosis not present

## 2017-08-21 DIAGNOSIS — M25572 Pain in left ankle and joints of left foot: Secondary | ICD-10-CM | POA: Diagnosis not present

## 2017-08-27 DIAGNOSIS — S93492A Sprain of other ligament of left ankle, initial encounter: Secondary | ICD-10-CM | POA: Diagnosis not present

## 2017-09-08 DIAGNOSIS — R0989 Other specified symptoms and signs involving the circulatory and respiratory systems: Secondary | ICD-10-CM | POA: Diagnosis not present

## 2017-09-08 DIAGNOSIS — Z7901 Long term (current) use of anticoagulants: Secondary | ICD-10-CM | POA: Diagnosis not present

## 2017-09-08 DIAGNOSIS — Z7982 Long term (current) use of aspirin: Secondary | ICD-10-CM | POA: Diagnosis not present

## 2017-09-08 DIAGNOSIS — Z8673 Personal history of transient ischemic attack (TIA), and cerebral infarction without residual deficits: Secondary | ICD-10-CM | POA: Diagnosis not present

## 2017-09-08 DIAGNOSIS — J45909 Unspecified asthma, uncomplicated: Secondary | ICD-10-CM | POA: Diagnosis not present

## 2017-09-08 DIAGNOSIS — I10 Essential (primary) hypertension: Secondary | ICD-10-CM | POA: Diagnosis not present

## 2017-09-08 DIAGNOSIS — Z86711 Personal history of pulmonary embolism: Secondary | ICD-10-CM | POA: Diagnosis not present

## 2017-09-08 DIAGNOSIS — Z86718 Personal history of other venous thrombosis and embolism: Secondary | ICD-10-CM | POA: Diagnosis not present

## 2017-09-08 DIAGNOSIS — R091 Pleurisy: Secondary | ICD-10-CM | POA: Diagnosis not present

## 2017-09-08 DIAGNOSIS — J069 Acute upper respiratory infection, unspecified: Secondary | ICD-10-CM | POA: Diagnosis not present

## 2017-09-08 DIAGNOSIS — I2510 Atherosclerotic heart disease of native coronary artery without angina pectoris: Secondary | ICD-10-CM | POA: Diagnosis not present

## 2017-09-08 DIAGNOSIS — Z79899 Other long term (current) drug therapy: Secondary | ICD-10-CM | POA: Diagnosis not present

## 2017-09-08 DIAGNOSIS — Z7951 Long term (current) use of inhaled steroids: Secondary | ICD-10-CM | POA: Diagnosis not present

## 2017-09-08 DIAGNOSIS — E119 Type 2 diabetes mellitus without complications: Secondary | ICD-10-CM | POA: Diagnosis not present

## 2017-09-08 DIAGNOSIS — R079 Chest pain, unspecified: Secondary | ICD-10-CM | POA: Diagnosis not present

## 2017-09-08 DIAGNOSIS — K219 Gastro-esophageal reflux disease without esophagitis: Secondary | ICD-10-CM | POA: Diagnosis not present

## 2017-09-08 DIAGNOSIS — Z794 Long term (current) use of insulin: Secondary | ICD-10-CM | POA: Diagnosis not present

## 2017-09-09 DIAGNOSIS — R0989 Other specified symptoms and signs involving the circulatory and respiratory systems: Secondary | ICD-10-CM | POA: Diagnosis not present

## 2017-09-09 DIAGNOSIS — R079 Chest pain, unspecified: Secondary | ICD-10-CM | POA: Diagnosis not present

## 2017-09-13 DIAGNOSIS — J189 Pneumonia, unspecified organism: Secondary | ICD-10-CM | POA: Diagnosis not present

## 2017-09-20 DIAGNOSIS — M545 Low back pain: Secondary | ICD-10-CM | POA: Diagnosis not present

## 2017-09-20 DIAGNOSIS — I10 Essential (primary) hypertension: Secondary | ICD-10-CM | POA: Diagnosis not present

## 2017-09-20 DIAGNOSIS — E785 Hyperlipidemia, unspecified: Secondary | ICD-10-CM | POA: Diagnosis not present

## 2017-09-20 DIAGNOSIS — I2699 Other pulmonary embolism without acute cor pulmonale: Secondary | ICD-10-CM | POA: Diagnosis not present

## 2017-09-20 DIAGNOSIS — K210 Gastro-esophageal reflux disease with esophagitis: Secondary | ICD-10-CM | POA: Diagnosis not present

## 2017-09-20 DIAGNOSIS — E118 Type 2 diabetes mellitus with unspecified complications: Secondary | ICD-10-CM | POA: Diagnosis not present

## 2017-10-14 DIAGNOSIS — M1990 Unspecified osteoarthritis, unspecified site: Secondary | ICD-10-CM | POA: Diagnosis not present

## 2017-10-14 DIAGNOSIS — I10 Essential (primary) hypertension: Secondary | ICD-10-CM | POA: Diagnosis not present

## 2017-10-14 DIAGNOSIS — E119 Type 2 diabetes mellitus without complications: Secondary | ICD-10-CM | POA: Diagnosis not present

## 2017-10-14 DIAGNOSIS — K219 Gastro-esophageal reflux disease without esophagitis: Secondary | ICD-10-CM | POA: Diagnosis not present

## 2017-10-14 DIAGNOSIS — I4891 Unspecified atrial fibrillation: Secondary | ICD-10-CM | POA: Diagnosis not present

## 2017-10-14 DIAGNOSIS — R0789 Other chest pain: Secondary | ICD-10-CM | POA: Diagnosis not present

## 2017-10-14 DIAGNOSIS — R079 Chest pain, unspecified: Secondary | ICD-10-CM | POA: Diagnosis not present

## 2017-10-14 DIAGNOSIS — R072 Precordial pain: Secondary | ICD-10-CM | POA: Diagnosis not present

## 2017-10-14 DIAGNOSIS — E7800 Pure hypercholesterolemia, unspecified: Secondary | ICD-10-CM | POA: Diagnosis not present

## 2017-10-15 DIAGNOSIS — R0789 Other chest pain: Secondary | ICD-10-CM | POA: Diagnosis not present

## 2017-10-15 DIAGNOSIS — R079 Chest pain, unspecified: Secondary | ICD-10-CM | POA: Diagnosis not present

## 2017-10-18 ENCOUNTER — Other Ambulatory Visit: Payer: Self-pay | Admitting: Family Medicine

## 2017-10-18 DIAGNOSIS — Z95828 Presence of other vascular implants and grafts: Secondary | ICD-10-CM

## 2017-10-18 DIAGNOSIS — R079 Chest pain, unspecified: Secondary | ICD-10-CM | POA: Diagnosis not present

## 2017-10-19 DIAGNOSIS — R0789 Other chest pain: Secondary | ICD-10-CM | POA: Diagnosis not present

## 2017-10-19 DIAGNOSIS — K219 Gastro-esophageal reflux disease without esophagitis: Secondary | ICD-10-CM | POA: Diagnosis not present

## 2017-10-19 DIAGNOSIS — M546 Pain in thoracic spine: Secondary | ICD-10-CM | POA: Diagnosis not present

## 2017-10-19 DIAGNOSIS — E119 Type 2 diabetes mellitus without complications: Secondary | ICD-10-CM | POA: Diagnosis not present

## 2017-10-19 DIAGNOSIS — I4891 Unspecified atrial fibrillation: Secondary | ICD-10-CM | POA: Diagnosis not present

## 2017-10-19 DIAGNOSIS — J45909 Unspecified asthma, uncomplicated: Secondary | ICD-10-CM | POA: Diagnosis not present

## 2017-10-19 DIAGNOSIS — M545 Low back pain: Secondary | ICD-10-CM | POA: Diagnosis not present

## 2017-10-19 DIAGNOSIS — Z7901 Long term (current) use of anticoagulants: Secondary | ICD-10-CM | POA: Diagnosis not present

## 2017-10-19 DIAGNOSIS — S29012A Strain of muscle and tendon of back wall of thorax, initial encounter: Secondary | ICD-10-CM | POA: Diagnosis not present

## 2017-10-19 DIAGNOSIS — I10 Essential (primary) hypertension: Secondary | ICD-10-CM | POA: Diagnosis not present

## 2017-10-20 DIAGNOSIS — M545 Low back pain: Secondary | ICD-10-CM | POA: Diagnosis not present

## 2017-10-20 DIAGNOSIS — M546 Pain in thoracic spine: Secondary | ICD-10-CM | POA: Diagnosis not present

## 2017-10-25 ENCOUNTER — Encounter: Payer: Self-pay | Admitting: Cardiology

## 2017-11-01 ENCOUNTER — Ambulatory Visit (INDEPENDENT_AMBULATORY_CARE_PROVIDER_SITE_OTHER): Payer: Medicare Other | Admitting: Cardiology

## 2017-11-01 ENCOUNTER — Encounter: Payer: Self-pay | Admitting: Cardiology

## 2017-11-01 VITALS — BP 116/72 | HR 79 | Ht 64.0 in | Wt 216.0 lb

## 2017-11-01 DIAGNOSIS — E782 Mixed hyperlipidemia: Secondary | ICD-10-CM

## 2017-11-01 DIAGNOSIS — Z86711 Personal history of pulmonary embolism: Secondary | ICD-10-CM | POA: Diagnosis not present

## 2017-11-01 DIAGNOSIS — Z7901 Long term (current) use of anticoagulants: Secondary | ICD-10-CM

## 2017-11-01 DIAGNOSIS — R0789 Other chest pain: Secondary | ICD-10-CM

## 2017-11-01 HISTORY — DX: Personal history of pulmonary embolism: Z86.711
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
## 2017-11-01 NOTE — Patient Instructions (Signed)
Medication Instructions:  Your physician recommends that you continue on your current medications as directed. Please refer to the Current Medication list given to you today.  If you need a refill on your cardiac medications before your next appointment, please call your pharmacy.   Lab work: None  If you have labs (blood work) drawn today and your tests are completely normal, you will receive your results only by: . MyChart Message (if you have MyChart) OR . A paper copy in the mail If you have any lab test that is abnormal or we need to change your treatment, we will call you to review the results.  Testing/Procedures: Your physician has requested that you have an echocardiogram. Echocardiography is a painless test that uses sound waves to create images of your heart. It provides your doctor with information about the size and shape of your heart and how well your heart's chambers and valves are working. This procedure takes approximately one hour. There are no restrictions for this procedure.   Your physician has requested that you have a lexiscan myoview. For further information please visit [email or web address]. Please follow instruction sheet, as given.  Follow-Up: At [facility], you and your health needs are our priority.  As part of our continuing mission to provide you with exceptional heart care, we have created designated Provider Care Teams.  These Care Teams include your primary Cardiologist (physician) and Advanced Practice Providers (APPs -  Physician Assistants and Nurse Practitioners) who all work together to provide you with the care you need, when you need it.  You will need a follow up appointment in 6 months.  Please call our office 2 months in advance to schedule this appointment.  You may see another member of our [facility] Provider Team in [place]: Robert Krasowski, MD . Brian Munley, MD  Any Other Special Instructions Will Be Listed Below (If  Applicable).    

## 2017-11-06 ENCOUNTER — Encounter: Payer: Self-pay | Admitting: Radiology

## 2017-11-06 ENCOUNTER — Ambulatory Visit
Admission: RE | Admit: 2017-11-06 | Discharge: 2017-11-06 | Disposition: A | Discharge status: Home / Self Care | Payer: Medicare Other | Source: Ambulatory Visit | Attending: Family Medicine | Admitting: Family Medicine

## 2017-11-06 DIAGNOSIS — Z95828 Presence of other vascular implants and grafts: Secondary | ICD-10-CM

## 2017-11-06 DIAGNOSIS — Z86718 Personal history of other venous thrombosis and embolism: Secondary | ICD-10-CM | POA: Diagnosis not present

## 2017-11-06 HISTORY — PX: IR RADIOLOGIST EVAL & MGMT: IMG5224

## 2017-11-06 NOTE — Consult Note (Signed)
Chief Complaint:   IVC filter assessment for possible removal  Referring Physician(s): Gage,John F.  History of Present Illness: Jennifer Lucero is a 67 y.o. female with multiple comorbidities including diabetes, asthma, previous pulmonary embolism and DVT in 2010.  In the acute setting at [facility] in 2010, the patient had an IVC filter placed.  She also has a history of hypertension and previous stroke.  She remains on Eliquis 5 mg twice daily as well as a baby aspirin.  Review of her imaging was performed on the PACS system.  On 09/02/2008.  Patient had a trapease Cordis filter inserted for significant acute DVT and pulmonary emboli.  This filter is a non-retrievable permanent filter and therefore cannot be removed.  Patient has no symptoms of acute recurrent DVT or PE.  No significant asymmetric extremity edema or pain.  Fluctuant ultrasound performed today as well which was negative for DVT bilaterally.  Past Medical History:  Diagnosis Date  . Anemia   . Anxiety 06/15/2017  . Asthma 06/15/2017  . Chest pain 06/15/2017  . Diabetic neuropathy ([place]) 06/15/2017  . Esophagitis, reflux 06/15/2017  . Hyperlipidemia 06/15/2017  . Knee pain   . Lumbago   . Osteoporosis 06/15/2017  . Pulmonary embolism ([place]) 06/15/2017  . Stroke [facility]) 06/15/2017    Past Surgical History:  Procedure Laterality Date  . HERNIA REPAIR      Allergies: Buspar [buspirone]; Ciprofloxacin; Ciprofloxacin-fluocinolone pf; and Codeine  Medications: Prior to Admission medications   Medication Sig Start Date End Date Taking? Authorizing Provider  albuterol (ACCUNEB) 0.63 MG/3ML nebulizer solution Inhale 1 ampule into the lungs as needed.    [provider]  albuterol (PROVENTIL HFA;VENTOLIN HFA) 108 (90 Base) MCG/ACT inhaler Inhale 2 puffs into the lungs every 4 (four) hours as needed. 12/04/15   [provider]  ALPRAZolam Duanne Moron) 0.5 MG tablet Take 1 tablet by mouth daily.  11/13/16   [provider]  apixaban (ELIQUIS) 5 MG TABS tablet Take 1 tablet by mouth daily. 10/28/15   [provider]  aspirin EC 81 MG tablet Take 1 tablet by mouth daily.    [provider]  esomeprazole (NEXIUM) 40 MG capsule Take 1 capsule by mouth 2 (two) times daily.    [provider]  Fe Fum-FA-B Cmp-C-Zn-Mg-Mn-Cu (FERROCITE PLUS) 106-1 MG TABS Take 1 tablet by mouth 2 (two) times daily. 10/18/15   [provider]  glimepiride (AMARYL) 4 MG tablet Take 1 tablet by mouth daily. 11/29/15   [provider]  Insulin Detemir (LEVEMIR FLEXTOUCH) 100 UNIT/ML Pen inject 35 units under skin daily 11/18/15   [provider]  Insulin Pen Needle (SURE COMFORT PEN NEEDLES) 31G X 8 MM MISC AS DIRECTED 05/19/17   [provider]  isosorbide mononitrate (IMDUR) 60 MG 24 hr tablet Take 1 tablet by mouth daily. 07/09/14   [provider]  lisinopril (PRINIVIL,ZESTRIL) 40 MG tablet Take 1 tablet by mouth daily. 04/30/17   [provider]  loratadine (CLARITIN) 10 MG tablet Take 1 tablet by mouth daily. 10/13/15   [provider]  metFORMIN (GLUCOPHAGE) 500 MG tablet Take 1 tablet by mouth 2 (two) times daily. 12/06/15   [provider]  nitroGLYCERIN (NITROSTAT) 0.4 MG SL tablet Place 1 tablet under the tongue every 5 (five) minutes as needed.    [provider]  pravastatin (PRAVACHOL) 20 MG tablet Take 1 tablet by mouth daily. 07/09/14   [provider]  sertraline (ZOLOFT) 100 MG tablet Take 1 tablet by mouth daily.    [provider]  traMADol (ULTRAM) 50 MG tablet Take 1 tablet by mouth daily. 11/13/16   [provider]  triamterene-hydrochlorothiazide (MAXZIDE-25) 37.5-25 MG tablet Take 1 tablet by mouth daily. 11/29/15   [provider]  Vitamin D, Ergocalciferol, (DRISDOL) 50000 units CAPS capsule Take 1 capsule by mouth once a week. 05/21/17   [provider]     Family History  Problem Relation Age of Onset  . Heart disease Mother   . Diabetes Brother     Social History   Socioeconomic History  . Marital status: Married    Spouse name: Not on file  . Number of children: Not on file  . Years of education: Not on file  . Highest education level: Not on file  Occupational History  . Not on file  Social Needs  . Financial resource strain: Not on file  . Food insecurity:    Worry: Not on file    Inability: Not on file  . Transportation needs:    Medical: Not on file    Non-medical: Not on file  Tobacco Use  . Smoking status: Former Research scientist (life sciences)  . Smokeless tobacco: Never Used  . Tobacco comment: quit 32 years ago  Substance and Sexual Activity  . Alcohol use: Not Currently  . Drug use: Not Currently  . Sexual activity: Not on file  Lifestyle  . Physical activity:    Days per week: Not on file    Minutes per session: Not on file  . Stress: Not on file  Relationships  . Social connections:    Talks on phone: Not on file    Gets together: Not on file    Attends religious service: Not on file    Active member of club or organization: Not on file    Attends meetings of clubs or organizations: Not on file    Relationship status: Not on file  Other Topics Concern  . Not on file  Social History Narrative  . Not on file     Review of Systems: A 12 point ROS discussed and pertinent positives are indicated in the HPI above.  All other systems are negative.  Review of Systems  Vital Signs: BP 134/65   Pulse 88   Temp 98.2 F (36.8 C) (Oral)   Resp 16   Ht 5\' 4"  (1.626 m)   Wt 96.2 kg   SpO2 97%   BMI 36.39 kg/m   Physical Exam  Constitutional: She is oriented to person, place, and time. She appears well-developed and well-nourished. No distress.  Eyes: Conjunctivae are normal. No scleral icterus.  Cardiovascular: Normal rate and regular rhythm.  Pulmonary/Chest: Effort normal and breath sounds normal.    Abdominal: Soft. Bowel sounds are normal.  Musculoskeletal: She exhibits no edema.  Previous traumatic left hand amputation.  Neurological: She is alert and oriented to person, place, and time.  Skin: Rash noted. She is not diaphoretic.  Psychiatric: She has a normal mood and affect. Her behavior is normal.     Imaging: No results found.  Labs:  CBC: No results for input(s): WBC, HGB, HCT, PLT in the last 8760 hours.  COAGS: No results for input(s): INR, APTT in the last 8760 hours.  BMP: No results for input(s): NA, K, CL, CO2, GLUCOSE, BUN, CALCIUM, CREATININE, GFRNONAA, GFRAA in the last 8760 hours.  Invalid input(s): CMP  LIVER FUNCTION TESTS:  No results for input(s): BILITOT, AST, ALT, ALKPHOS, PROT, ALBUMIN in the last 8760 hours.  Assessment and Plan:  Filter assessment demonstrates previous insertion of a permanent non-retrievable TrapEase Cordis filter in August 2010 at [facility].  This was performed for acute symptomatic DVT PE and associated stroke.  Patient remains on lifelong anticoagulation including Eliquis and baby aspirin.  Ultrasound of the lower extremities today demonstrates no evidence of residual DVT.  Plan: Trapease Cordis filter will remain.  Continue lifelong anticoagulation with combined Eliquis and baby aspirin.  Thank you for this interesting consult.  I greatly enjoyed meeting DAVEY BERGSMA and look forward to participating in their care.  A copy of this report was sent to the requesting provider on this date.  Electronically Signed: Greggory Keen 11/06/2017, 2:26 PM   I spent a total of  40 Minutes   in face to face in clinical consultation, greater than 50% of which was counseling/coordinating care for this patient with a permanent IVC filter.

## 2017-11-15 ENCOUNTER — Telehealth (HOSPITAL_COMMUNITY): Payer: Self-pay | Admitting: *Deleted

## 2017-11-15 NOTE — Telephone Encounter (Signed)
Left message on voicemail in reference to upcoming appointment scheduled for 11/20/17 Phone number given for a call back so details instructions can be given. Kirstie Peri

## 2017-11-19 ENCOUNTER — Telehealth (HOSPITAL_COMMUNITY): Payer: Self-pay | Admitting: *Deleted

## 2017-11-19 NOTE — Telephone Encounter (Signed)
Left message on voicemail in reference to upcoming appointment scheduled for 11/20/17 Phone number given for a call back so details instructions can be given. Kirstie Peri

## 2017-11-20 ENCOUNTER — Ambulatory Visit: Payer: Medicare Other

## 2017-11-21 ENCOUNTER — Ambulatory Visit: Payer: Self-pay

## 2017-11-27 ENCOUNTER — Ambulatory Visit (INDEPENDENT_AMBULATORY_CARE_PROVIDER_SITE_OTHER): Payer: Medicare Other

## 2017-11-27 DIAGNOSIS — Z86711 Personal history of pulmonary embolism: Secondary | ICD-10-CM

## 2017-11-27 DIAGNOSIS — R0789 Other chest pain: Secondary | ICD-10-CM

## 2017-11-27 DIAGNOSIS — Z7901 Long term (current) use of anticoagulants: Secondary | ICD-10-CM | POA: Diagnosis not present

## 2017-11-27 DIAGNOSIS — R079 Chest pain, unspecified: Secondary | ICD-10-CM | POA: Diagnosis not present

## 2017-11-27 DIAGNOSIS — E782 Mixed hyperlipidemia: Secondary | ICD-10-CM

## 2017-11-27 MED ORDER — TECHNETIUM TC 99M TETROFOSMIN IV KIT
31.3000 | PACK | Freq: Once | INTRAVENOUS | Status: AC | PRN
  Administered 2017-11-27: 31.3 via INTRAVENOUS

## 2017-11-27 MED ORDER — REGADENOSON 0.4 MG/5ML IV SOLN
0.4000 mg | Freq: Once | INTRAVENOUS | Status: AC
  Administered 2017-11-27: 0.4 mg via INTRAVENOUS

## 2017-11-28 ENCOUNTER — Ambulatory Visit: Payer: Medicare Other

## 2017-11-28 ENCOUNTER — Telehealth: Payer: Self-pay

## 2017-11-28 LAB — MYOCARDIAL PERFUSION IMAGING
LV dias vol: 56 mL (ref 46–106)
LV sys vol: 20 mL
Peak HR: 121 {beats}/min
Rest HR: 88 {beats}/min
SDS: 2
SRS: 2
SSS: 4
TID: 0.9

## 2017-11-28 MED ORDER — TECHNETIUM TC 99M TETROFOSMIN IV KIT
31.4000 | PACK | Freq: Once | INTRAVENOUS | Status: AC | PRN
  Administered 2017-11-28: 31.4 via INTRAVENOUS

## 2017-11-28 NOTE — Telephone Encounter (Signed)
Attempted to call patient with results. 

## 2017-12-10 DIAGNOSIS — I2510 Atherosclerotic heart disease of native coronary artery without angina pectoris: Secondary | ICD-10-CM | POA: Diagnosis not present

## 2017-12-10 DIAGNOSIS — M25551 Pain in right hip: Secondary | ICD-10-CM | POA: Diagnosis not present

## 2017-12-11 NOTE — Telephone Encounter (Signed)
Attempted to mail letter, was sent back.

## 2017-12-13 DIAGNOSIS — Z7901 Long term (current) use of anticoagulants: Secondary | ICD-10-CM | POA: Diagnosis not present

## 2017-12-13 DIAGNOSIS — M25571 Pain in right ankle and joints of right foot: Secondary | ICD-10-CM | POA: Diagnosis not present

## 2017-12-13 DIAGNOSIS — S9001XA Contusion of right ankle, initial encounter: Secondary | ICD-10-CM | POA: Diagnosis not present

## 2017-12-13 DIAGNOSIS — S99911A Unspecified injury of right ankle, initial encounter: Secondary | ICD-10-CM | POA: Diagnosis not present

## 2017-12-19 ENCOUNTER — Other Ambulatory Visit: Payer: Self-pay

## 2017-12-22 DIAGNOSIS — I10 Essential (primary) hypertension: Secondary | ICD-10-CM | POA: Diagnosis not present

## 2017-12-22 DIAGNOSIS — Z7951 Long term (current) use of inhaled steroids: Secondary | ICD-10-CM | POA: Diagnosis not present

## 2017-12-22 DIAGNOSIS — M1711 Unilateral primary osteoarthritis, right knee: Secondary | ICD-10-CM | POA: Diagnosis not present

## 2017-12-22 DIAGNOSIS — S8011XA Contusion of right lower leg, initial encounter: Secondary | ICD-10-CM | POA: Diagnosis not present

## 2017-12-22 DIAGNOSIS — R601 Generalized edema: Secondary | ICD-10-CM | POA: Diagnosis not present

## 2017-12-22 DIAGNOSIS — J45909 Unspecified asthma, uncomplicated: Secondary | ICD-10-CM | POA: Diagnosis not present

## 2017-12-22 DIAGNOSIS — K219 Gastro-esophageal reflux disease without esophagitis: Secondary | ICD-10-CM | POA: Diagnosis not present

## 2017-12-22 DIAGNOSIS — Z794 Long term (current) use of insulin: Secondary | ICD-10-CM | POA: Diagnosis not present

## 2017-12-22 DIAGNOSIS — Z7901 Long term (current) use of anticoagulants: Secondary | ICD-10-CM | POA: Diagnosis not present

## 2017-12-22 DIAGNOSIS — M79661 Pain in right lower leg: Secondary | ICD-10-CM | POA: Diagnosis not present

## 2017-12-22 DIAGNOSIS — Z79899 Other long term (current) drug therapy: Secondary | ICD-10-CM | POA: Diagnosis not present

## 2017-12-22 DIAGNOSIS — S8991XA Unspecified injury of right lower leg, initial encounter: Secondary | ICD-10-CM | POA: Diagnosis not present

## 2017-12-22 DIAGNOSIS — Z7982 Long term (current) use of aspirin: Secondary | ICD-10-CM | POA: Diagnosis not present

## 2017-12-22 DIAGNOSIS — Z8673 Personal history of transient ischemic attack (TIA), and cerebral infarction without residual deficits: Secondary | ICD-10-CM | POA: Diagnosis not present

## 2017-12-22 DIAGNOSIS — Z86718 Personal history of other venous thrombosis and embolism: Secondary | ICD-10-CM | POA: Diagnosis not present

## 2017-12-22 DIAGNOSIS — I2510 Atherosclerotic heart disease of native coronary artery without angina pectoris: Secondary | ICD-10-CM | POA: Diagnosis not present

## 2017-12-22 DIAGNOSIS — Z86711 Personal history of pulmonary embolism: Secondary | ICD-10-CM | POA: Diagnosis not present

## 2017-12-22 DIAGNOSIS — E119 Type 2 diabetes mellitus without complications: Secondary | ICD-10-CM | POA: Diagnosis not present

## 2017-12-27 DIAGNOSIS — E118 Type 2 diabetes mellitus with unspecified complications: Secondary | ICD-10-CM | POA: Diagnosis not present

## 2017-12-27 DIAGNOSIS — I10 Essential (primary) hypertension: Secondary | ICD-10-CM | POA: Diagnosis not present

## 2017-12-27 DIAGNOSIS — E785 Hyperlipidemia, unspecified: Secondary | ICD-10-CM | POA: Diagnosis not present

## 2017-12-27 DIAGNOSIS — M79671 Pain in right foot: Secondary | ICD-10-CM | POA: Diagnosis not present

## 2018-01-08 ENCOUNTER — Other Ambulatory Visit: Payer: Self-pay | Admitting: Gastroenterology

## 2018-02-11 DIAGNOSIS — S41111A Laceration without foreign body of right upper arm, initial encounter: Secondary | ICD-10-CM | POA: Diagnosis not present

## 2018-03-19 ENCOUNTER — Other Ambulatory Visit: Payer: Self-pay

## 2018-03-19 NOTE — Patient Outreach (Signed)
[facility]) Care Management  03/19/2018  Jennifer Lucero 1950-02-12 [id number]   Medication Adherence call to Jennifer Lucero patient did not answer patient  is due on Pravastatin 20 mg last pick up was on 02/09/18 for a 30 days supply. Jennifer Lucero is showing due under [place].    [place] Management Direct Dial [phone]  Fax [phone] [email or web address]

## 2018-04-01 DIAGNOSIS — I10 Essential (primary) hypertension: Secondary | ICD-10-CM | POA: Diagnosis not present

## 2018-04-01 DIAGNOSIS — K210 Gastro-esophageal reflux disease with esophagitis: Secondary | ICD-10-CM | POA: Diagnosis not present

## 2018-04-01 DIAGNOSIS — E785 Hyperlipidemia, unspecified: Secondary | ICD-10-CM | POA: Diagnosis not present

## 2018-04-01 DIAGNOSIS — E118 Type 2 diabetes mellitus with unspecified complications: Secondary | ICD-10-CM | POA: Diagnosis not present

## 2018-04-01 DIAGNOSIS — Z79899 Other long term (current) drug therapy: Secondary | ICD-10-CM | POA: Diagnosis not present

## 2018-04-01 DIAGNOSIS — D649 Anemia, unspecified: Secondary | ICD-10-CM | POA: Diagnosis not present

## 2018-04-01 DIAGNOSIS — M545 Low back pain: Secondary | ICD-10-CM | POA: Diagnosis not present

## 2018-04-05 DIAGNOSIS — I10 Essential (primary) hypertension: Secondary | ICD-10-CM | POA: Diagnosis not present

## 2018-04-05 DIAGNOSIS — Z8673 Personal history of transient ischemic attack (TIA), and cerebral infarction without residual deficits: Secondary | ICD-10-CM | POA: Diagnosis not present

## 2018-04-05 DIAGNOSIS — Z79899 Other long term (current) drug therapy: Secondary | ICD-10-CM | POA: Diagnosis not present

## 2018-04-05 DIAGNOSIS — E119 Type 2 diabetes mellitus without complications: Secondary | ICD-10-CM | POA: Diagnosis not present

## 2018-04-05 DIAGNOSIS — Z794 Long term (current) use of insulin: Secondary | ICD-10-CM | POA: Diagnosis not present

## 2018-04-05 DIAGNOSIS — J019 Acute sinusitis, unspecified: Secondary | ICD-10-CM | POA: Diagnosis not present

## 2018-04-05 DIAGNOSIS — Z86711 Personal history of pulmonary embolism: Secondary | ICD-10-CM | POA: Diagnosis not present

## 2018-04-05 DIAGNOSIS — K219 Gastro-esophageal reflux disease without esophagitis: Secondary | ICD-10-CM | POA: Diagnosis not present

## 2018-04-05 DIAGNOSIS — Z7982 Long term (current) use of aspirin: Secondary | ICD-10-CM | POA: Diagnosis not present

## 2018-04-05 DIAGNOSIS — I2510 Atherosclerotic heart disease of native coronary artery without angina pectoris: Secondary | ICD-10-CM | POA: Diagnosis not present

## 2018-04-05 DIAGNOSIS — Z86718 Personal history of other venous thrombosis and embolism: Secondary | ICD-10-CM | POA: Diagnosis not present

## 2018-04-05 DIAGNOSIS — Z7951 Long term (current) use of inhaled steroids: Secondary | ICD-10-CM | POA: Diagnosis not present

## 2018-04-05 DIAGNOSIS — Z7901 Long term (current) use of anticoagulants: Secondary | ICD-10-CM | POA: Diagnosis not present

## 2018-04-05 DIAGNOSIS — J45909 Unspecified asthma, uncomplicated: Secondary | ICD-10-CM | POA: Diagnosis not present

## 2018-04-08 DIAGNOSIS — J019 Acute sinusitis, unspecified: Secondary | ICD-10-CM | POA: Diagnosis not present

## 2018-04-30 ENCOUNTER — Other Ambulatory Visit: Payer: Self-pay

## 2018-04-30 NOTE — Patient Outreach (Signed)
[facility]) Care Management  04/30/2018  ABERDEEN HAFEN [birth year] [id number]   Medication Adherence call to Mrs. Jennifer Lucero left  Message with patiens husband to call back patient is at doctors appointment  [name] is showing past due on Pravastatin 20 mg under [place].    [place] Management Direct Dial [phone]  Fax [phone] [email or web address]

## 2018-05-24 ENCOUNTER — Other Ambulatory Visit: Payer: Self-pay

## 2018-05-24 NOTE — Patient Outreach (Signed)
[facility]) Care Management  05/24/2018  LITZI BINNING Aug 25, 1950 [id number]   Medication Adherence call to Mrs. Graceyn Fodor patient did not answer patient is due on Pravastatin 20 mg under [place].   [place] Management Direct Dial [phone]  Fax [phone] [email or web address]

## 2018-06-13 DIAGNOSIS — L03211 Cellulitis of face: Secondary | ICD-10-CM | POA: Diagnosis not present

## 2018-06-18 DIAGNOSIS — C44319 Basal cell carcinoma of skin of other parts of face: Secondary | ICD-10-CM | POA: Diagnosis not present

## 2018-06-18 DIAGNOSIS — L578 Other skin changes due to chronic exposure to nonionizing radiation: Secondary | ICD-10-CM | POA: Diagnosis not present

## 2018-06-18 DIAGNOSIS — L821 Other seborrheic keratosis: Secondary | ICD-10-CM | POA: Diagnosis not present

## 2018-06-18 DIAGNOSIS — L570 Actinic keratosis: Secondary | ICD-10-CM | POA: Diagnosis not present

## 2018-06-24 DIAGNOSIS — C44329 Squamous cell carcinoma of skin of other parts of face: Secondary | ICD-10-CM | POA: Diagnosis not present

## 2018-07-02 ENCOUNTER — Other Ambulatory Visit: Payer: Self-pay

## 2018-07-02 NOTE — Patient Outreach (Signed)
[place] [facility]) Care Management  07/02/2018  SHAKIERA EDELSON 1950/09/11 [id number]   Medication Adherence call to Mrs. Jessa Stinson HIPPA Compliant Voice message left with a call back number. Mrs. Minor is showing past due on Pravastatin 20 mg under [place].   [place] Management Direct Dial [phone]  Fax [phone] [email or web address]

## 2018-07-04 DIAGNOSIS — Z8673 Personal history of transient ischemic attack (TIA), and cerebral infarction without residual deficits: Secondary | ICD-10-CM | POA: Diagnosis not present

## 2018-07-04 DIAGNOSIS — E785 Hyperlipidemia, unspecified: Secondary | ICD-10-CM | POA: Diagnosis not present

## 2018-07-04 DIAGNOSIS — E1121 Type 2 diabetes mellitus with diabetic nephropathy: Secondary | ICD-10-CM | POA: Diagnosis not present

## 2018-07-04 DIAGNOSIS — I2699 Other pulmonary embolism without acute cor pulmonale: Secondary | ICD-10-CM | POA: Diagnosis not present

## 2018-07-04 DIAGNOSIS — E118 Type 2 diabetes mellitus with unspecified complications: Secondary | ICD-10-CM | POA: Diagnosis not present

## 2018-07-08 DIAGNOSIS — D649 Anemia, unspecified: Secondary | ICD-10-CM | POA: Diagnosis not present

## 2018-07-08 DIAGNOSIS — E785 Hyperlipidemia, unspecified: Secondary | ICD-10-CM | POA: Diagnosis not present

## 2018-07-08 DIAGNOSIS — E118 Type 2 diabetes mellitus with unspecified complications: Secondary | ICD-10-CM | POA: Diagnosis not present

## 2018-10-08 DIAGNOSIS — N3281 Overactive bladder: Secondary | ICD-10-CM | POA: Diagnosis not present

## 2018-10-08 DIAGNOSIS — J301 Allergic rhinitis due to pollen: Secondary | ICD-10-CM | POA: Diagnosis not present

## 2018-10-08 DIAGNOSIS — E559 Vitamin D deficiency, unspecified: Secondary | ICD-10-CM | POA: Diagnosis not present

## 2018-10-08 DIAGNOSIS — E118 Type 2 diabetes mellitus with unspecified complications: Secondary | ICD-10-CM | POA: Diagnosis not present

## 2018-10-14 DIAGNOSIS — M25551 Pain in right hip: Secondary | ICD-10-CM | POA: Diagnosis not present

## 2018-10-14 DIAGNOSIS — E118 Type 2 diabetes mellitus with unspecified complications: Secondary | ICD-10-CM | POA: Diagnosis not present

## 2018-10-14 DIAGNOSIS — K210 Gastro-esophageal reflux disease with esophagitis: Secondary | ICD-10-CM | POA: Diagnosis not present

## 2018-10-14 DIAGNOSIS — I2699 Other pulmonary embolism without acute cor pulmonale: Secondary | ICD-10-CM | POA: Diagnosis not present

## 2018-10-14 DIAGNOSIS — I10 Essential (primary) hypertension: Secondary | ICD-10-CM | POA: Diagnosis not present

## 2018-10-14 DIAGNOSIS — E785 Hyperlipidemia, unspecified: Secondary | ICD-10-CM | POA: Diagnosis not present

## 2018-10-24 ENCOUNTER — Encounter: Payer: Self-pay | Admitting: Cardiology

## 2018-10-24 ENCOUNTER — Ambulatory Visit (INDEPENDENT_AMBULATORY_CARE_PROVIDER_SITE_OTHER): Payer: Medicare Other | Admitting: Cardiology

## 2018-10-24 ENCOUNTER — Other Ambulatory Visit: Payer: Self-pay

## 2018-10-24 VITALS — BP 160/80 | HR 74 | Ht 64.0 in | Wt 221.0 lb

## 2018-10-24 DIAGNOSIS — I10 Essential (primary) hypertension: Secondary | ICD-10-CM | POA: Diagnosis not present

## 2018-10-24 DIAGNOSIS — Z7901 Long term (current) use of anticoagulants: Secondary | ICD-10-CM

## 2018-10-24 DIAGNOSIS — E782 Mixed hyperlipidemia: Secondary | ICD-10-CM

## 2018-10-24 DIAGNOSIS — I209 Angina pectoris, unspecified: Secondary | ICD-10-CM | POA: Diagnosis not present

## 2018-10-24 DIAGNOSIS — Z86711 Personal history of pulmonary embolism: Secondary | ICD-10-CM | POA: Diagnosis not present

## 2018-10-24 HISTORY — DX: Essential (primary) hypertension: I10

## 2018-10-24 HISTORY — DX: Angina pectoris, unspecified: I20.9

## 2018-10-24 MED ORDER — ISOSORBIDE MONONITRATE ER 120 MG PO TB24: 120.0000 mg | ORAL_TABLET | Freq: Every day | ORAL | 5 refills | Status: DC

## 2018-10-24 NOTE — Patient Instructions (Signed)
Medication Instructions:  Your physician has recommended you make the following change in your medication:   INCREASE imdur to 120 mg (1 tablet ) daily  If you need a refill on your cardiac medications before your next appointment, please call your pharmacy.   Lab work: You will have a BMP and CBC drawn today. If you have labs (blood work) drawn today and your tests are completely normal, you will receive your results only by: [name] Kitchen MyChart Message (if you have MyChart) OR . A paper copy in the mail If you have any lab test that is abnormal or we need to change your treatment, we will call you to review the results.  Testing/Procedures: You had an EKG performed today.  Your physician has requested that you have a cardiac catheterization. Cardiac catheterization is used to diagnose and/or treat various heart conditions. Doctors may recommend this procedure for a number of different reasons. The most common reason is to evaluate chest pain. Chest pain can be a symptom of coronary artery disease (CAD), and cardiac catheterization can show whether plaque is narrowing or blocking your heart's arteries. This procedure is also used to evaluate the valves, as well as measure the blood flow and oxygen levels in different parts of your heart. For further information please visit [email or web address]. Please follow instruction sheet, as given.     [place] [address] [place] [address] Dept: [phone] Loc: [phone]  Jennifer Lucero  10/24/2018  You are scheduled for a Cardiac Catheterization on Friday, October 9 with Dr. Daneen Schick.  1. Please arrive at the [facility] (Main Entrance A) at [facility]: [address] [place], [place] [address] at 10:00 AM (This time is two hours before your procedure to ensure your preparation). Free valet parking service is available.   Special note: Every  effort is made to have your procedure done on time. Please understand that emergencies sometimes delay scheduled procedures.  2. Diet: Do not eat solid foods after midnight.  The patient may have clear liquids until 5am upon the day of the procedure.  3. Labs: NONE 4. Medication instructions in preparation for your procedure:   Contrast Allergy: No   Stop taking Eliquis (Apixiban) on Wednesday, October 7.  Take only 50 units of insulin the night before your procedure. Do not take any insulin on the day of the procedure.  Do not take Diabetes Med Glucophage (Metformin) on the day of the procedure and HOLD 48 HOURS AFTER THE PROCEDURE.  On the morning of your procedure, take your Aspirin and any morning medicines NOT listed above.  You may use sips of water.  5. Plan for one night stay--bring personal belongings. 6. Bring a current list of your medications and current insurance cards. 7. You MUST have a responsible person to drive you home. 8. Someone MUST be with you the first 24 hours after you arrive home or your discharge will be delayed. 9. Please wear clothes that are easy to get on and off and wear slip-on shoes.  Thank you for allowing [place] to care for you!   -- [place] [facility] Follow-Up: At [facility], you and your health needs are our priority.  As part of our continuing mission to provide you with exceptional heart care, we have created designated Provider Care Teams.  These Care Teams include your primary Cardiologist (physician) and Advanced Practice Providers (APPs -  Physician  Assistants and Nurse Practitioners) who all work together to provide you with the care you need, when you need it. You will need a follow up appointment in 1 months.    Any Other Special Instructions Will Be Listed Below   Coronary Angiogram With Stent Coronary angiogram with stent placement is a procedure to widen or open a narrow blood vessel of the heart (coronary  artery). Arteries may become blocked by cholesterol buildup (plaques) in the lining of the wall. When a coronary artery becomes partially blocked, blood flow to that area decreases. This may lead to chest pain or a heart attack (myocardial infarction). A stent is a small piece of metal that looks like mesh or a spring. Stent placement may be done as treatment for a heart attack or right after a coronary angiogram in which a blocked artery is found. Let your health care provider know about:  Any allergies you have.  All medicines you are taking, including vitamins, herbs, eye drops, creams, and over-the-counter medicines.  Any problems you or family members have had with anesthetic medicines.  Any blood disorders you have.  Any surgeries you have had.  Any medical conditions you have.  Whether you are pregnant or may be pregnant. What are the risks? Generally, this is a safe procedure. However, problems may occur, including:  Damage to the heart or its blood vessels.  A return of blockage.  Bleeding, infection, or bruising at the insertion site.  A collection of blood under the skin (hematoma) at the insertion site.  A blood clot in another part of the body.  Kidney injury.  Allergic reaction to the dye or contrast that is used.  Bleeding into the abdomen (retroperitoneal bleeding). What happens before the procedure? Staying hydrated Follow instructions from your health care provider about hydration, which may include:  Up to 2 hours before the procedure - you may continue to drink clear liquids, such as water, clear fruit juice, black coffee, and plain tea.  Eating and drinking restrictions Follow instructions from your health care provider about eating and drinking, which may include:  8 hours before the procedure - stop eating heavy meals or foods such as meat, fried foods, or fatty foods.  6 hours before the procedure - stop eating light meals or foods, such as toast  or cereal.  2 hours before the procedure - stop drinking clear liquids. Ask your health care provider about:  Changing or stopping your regular medicines. This is especially important if you are taking diabetes medicines or blood thinners.  Taking medicines such as ibuprofen. These medicines can thin your blood. Do not take these medicines before your procedure if your health care provider instructs you not to. Generally, aspirin is recommended before a procedure of passing a small, thin tube (catheter) through a blood vessel and into the heart (cardiac catheterization). What happens during the procedure?   An IV tube will be inserted into one of your veins.  You will be given one or more of the following: ? A medicine to help you relax (sedative). ? A medicine to numb the area where the catheter will be inserted into an artery (local anesthetic).  To reduce your risk of infection: ? Your health care team will wash or sanitize their hands. ? Your skin will be washed with soap. ? Hair may be removed from the area where the catheter will be inserted.  Using a guide wire, the catheter will be inserted into an artery. The location  may be in your groin, in your wrist, or in the fold of your arm (near your elbow).  A type of X-ray (fluoroscopy) will be used to help guide the catheter to the opening of the arteries in the heart.  A dye will be injected into the catheter, and X-rays will be taken. The dye will help to show where any narrowing or blockages are located in the arteries.  A tiny wire will be guided to the blocked spot, and a balloon will be inflated to make the artery wider.  The stent will be expanded and will crush the plaques into the wall of the vessel. The stent will hold the area open and improve the blood flow. Most stents have a drug coating to reduce the risk of the stent narrowing over time.  The artery may be made wider using a drill, laser, or other tools to remove  plaques.  When the blood flow is better, the catheter will be removed. The lining of the artery will grow over the stent, which stays where it was placed. This procedure may vary among health care providers and hospitals. What happens after the procedure?  If the procedure is done through the leg, you will be kept in bed lying flat for about 6 hours. You will be instructed to not bend and not cross your legs.  The insertion site will be checked frequently.  The pulse in your foot or wrist will be checked frequently.  You may have additional blood tests, X-rays, and a test that records the electrical activity of your heart (electrocardiogram, or ECG). This information is not intended to replace advice given to you by your health care provider. Make sure you discuss any questions you have with your health care provider. Document Released: 07/16/2002 Document Revised: 04/20/2017 Document Reviewed: 08/15/2015 Elsevier Patient Education  2020 [employer].

## 2018-10-24 NOTE — H&P (View-Only) (Signed)
Cardiology Office Note:    Date:  10/24/2018   ID:  Jennifer Lucero, DOB [birth year], MRN [id number]  PCP:  Jennifer Doyne., MD  Cardiologist:  Jennifer Lindau, MD   Referring MD: Jennifer Doyne., MD    ASSESSMENT:    1. Angina pectoris ([place])   2. History of pulmonary embolus (PE)   3. Mixed hyperlipidemia   4. Chronic anticoagulation   5. Essential hypertension    PLAN:    In order of problems listed above:  1. Angina pectoris: Patient symptoms are very concerning and I discussed this with the patient at extensive length.  Late last day she has had stress test which was unremarkable.I discussed coronary angiography and left heart catheterization with the patient at extensive length. Procedure, benefits and potential risks were explained. Patient had multiple questions which were answered to the patient's satisfaction. Patient agreed and consented for the procedure. Further recommendations will be made based on the findings of the coronary angiography. In the interim. The patient has any significant symptoms he knows to go to the nearest emergency room. 2. Essential hypertension: Blood pressure is elevated.  I will increase her isosorbide mononitrate to 120 mg daily. 3. Obesity: Diet was discussed.  Importance of diet stressed and she vocalized understanding.  Further recommendations will be made based on the findings of the coronary angiography.  Patient knows to go to the nearest emergency room for any concerning symptoms.  Patient and family member had multiple questions which were answered to their satisfaction.  She is on anticoagulation and that will have to be withheld for a short period of time for the test.  Benefits and risks explained and she is agreeable.   Medication Adjustments/Labs and Tests Ordered: Current medicines are reviewed at length with the patient today.  Concerns regarding medicines are outlined above.  No orders of the defined types were placed in this  encounter.  No orders of the defined types were placed in this encounter.    Chief Complaint  Patient presents with  . Follow-up    Per Dr. Micheal Lucero for BP     History of Present Illness:    Jennifer Lucero is a 68 y.o. female.  Patient has past medical history of pulmonary embolism, essential hypertension and dyslipidemia.  She is morbidly obese.  She gives history suggesting of angina pectoris.  She mentions to me that she when she exerts she gets out of breath also has substernal chest tightness radiating to the neck.  She happens to have the symptoms once or twice a week.  She uses nitroglycerin with relief.  At the time of my evaluation, the patient is alert awake oriented and in no distress.  Past Medical History:  Diagnosis Date  . Anemia   . Anxiety 06/15/2017  . Asthma 06/15/2017  . Chest pain 06/15/2017  . Diabetic neuropathy ([place]) 06/15/2017  . Esophagitis, reflux 06/15/2017  . Hyperlipidemia 06/15/2017  . Knee pain   . Lumbago   . Osteoporosis 06/15/2017  . Pulmonary embolism ([place]) 06/15/2017  . Stroke [facility]) 06/15/2017    Past Surgical History:  Procedure Laterality Date  . HERNIA REPAIR    . IR RADIOLOGIST EVAL & MGMT  11/06/2017    Current Medications: Current Meds  Medication Sig  . albuterol (PROVENTIL HFA;VENTOLIN HFA) 108 (90 Base) MCG/ACT inhaler Inhale 2 puffs into the lungs every 4 (four) hours as needed.  . ALPRAZolam (XANAX) 0.5 MG tablet Take 1 tablet by mouth daily.  [name] Kitchen  apixaban (ELIQUIS) 5 MG TABS tablet Take 1 tablet by mouth daily.  [name] Kitchen aspirin EC 81 MG tablet Take 1 tablet by mouth daily.  [name] Kitchen esomeprazole (NEXIUM) 40 MG capsule Take 1 capsule by mouth 2 (two) times daily.  . Fe Fum-FA-B Cmp-C-Zn-Mg-Mn-Cu (FERROCITE PLUS) 106-1 MG TABS Take 1 tablet by mouth 2 (two) times daily.  [name] Kitchen glimepiride (AMARYL) 4 MG tablet Take 1 tablet by mouth daily.  . Insulin Detemir (LEVEMIR FLEXTOUCH) 100 UNIT/ML Pen inject 35 units under skin daily  . isosorbide mononitrate  (IMDUR) 60 MG 24 hr tablet Take 1 tablet by mouth daily.  [name] Kitchen lisinopril (PRINIVIL,ZESTRIL) 40 MG tablet Take 1 tablet by mouth daily.  [name] Kitchen loratadine (CLARITIN) 10 MG tablet Take 1 tablet by mouth daily.  . metFORMIN (GLUCOPHAGE) 500 MG tablet Take 1 tablet by mouth 2 (two) times daily.  . metoprolol succinate (TOPROL-XL) 25 MG 24 hr tablet Take 25 mg by mouth at bedtime.  . nitroGLYCERIN (NITROSTAT) 0.4 MG SL tablet Place 1 tablet under the tongue every 5 (five) minutes as needed.  . pravastatin (PRAVACHOL) 10 MG tablet Take 10 mg by mouth daily.  . traMADol (ULTRAM) 50 MG tablet Take 1 tablet by mouth daily.  . Vitamin D, Ergocalciferol, (DRISDOL) 50000 units CAPS capsule Take 1 capsule by mouth once a week.     Allergies:   Buspar [buspirone], Ciprofloxacin, Ciprofloxacin-fluocinolone pf, and Codeine   Social History   Socioeconomic History  . Marital status: Married    Spouse name: Not on file  . Number of children: Not on file  . Years of education: Not on file  . Highest education level: Not on file  Occupational History  . Not on file  Social Needs  . Financial resource strain: Not on file  . Food insecurity    Worry: Not on file    Inability: Not on file  . Transportation needs    Medical: Not on file    Non-medical: Not on file  Tobacco Use  . Smoking status: Never Smoker  . Smokeless tobacco: Never Used  Substance and Sexual Activity  . Alcohol use: Not Currently  . Drug use: Not Currently  . Sexual activity: Not on file  Lifestyle  . Physical activity    Days per week: Not on file    Minutes per session: Not on file  . Stress: Not on file  Relationships  . Social Herbalist on phone: Not on file    Gets together: Not on file    Attends religious service: Not on file    Active member of club or organization: Not on file    Attends meetings of clubs or organizations: Not on file    Relationship status: Not on file  Other Topics Concern  . Not on  file  Social History Narrative  . Not on file     Family History: The patient's family history includes Diabetes in her brother; Heart disease in her mother.  ROS:   Please see the history of present illness.    All other systems reviewed and are negative.  EKGs/Labs/Other Studies Reviewed:    The following studies were reviewed today: EKG reveals sinus rhythm, right bundle branch block and left anterior fascicular block and nonspecific ST-T changes.   Recent Labs: No results found for requested labs within last 8760 hours.  Recent Lipid Panel No results found for: CHOL, TRIG, HDL, CHOLHDL, VLDL, LDLCALC, LDLDIRECT  Physical Exam:  VS:  BP (!) 160/80 (BP Location: Right Arm, Patient Position: Sitting, Cuff Size: Normal)   Pulse 74   Ht 5\' 4"  (1.626 m)   Wt 221 lb (100.2 kg)   SpO2 94%   BMI 37.93 kg/m     Wt Readings from Last 3 Encounters:  10/24/18 221 lb (100.2 kg)  11/06/17 212 lb (96.2 kg)  11/01/17 216 lb (98 kg)     GEN: Patient is in no acute distress HEENT: Normal NECK: No JVD; No carotid bruits LYMPHATICS: No lymphadenopathy CARDIAC: Hear sounds regular, 2/6 systolic murmur at the apex. RESPIRATORY:  Clear to auscultation without rales, wheezing or rhonchi  ABDOMEN: Soft, non-tender, non-distended MUSCULOSKELETAL:  No edema; No deformity  SKIN: Warm and dry NEUROLOGIC:  Alert and oriented x 3 PSYCHIATRIC:  Normal affect   Signed, Jennifer Lindau, MD  10/24/2018 3:36 PM    [facility]

## 2018-10-24 NOTE — Progress Notes (Signed)
Cardiology Office Note:    Date:  10/24/2018   ID:  Jennifer Lucero, DOB May 24, 1950, MRN [id number]  PCP:  Ocie Doyne., MD  Cardiologist:  Jenean Lindau, MD   Referring MD: Ocie Doyne., MD    ASSESSMENT:    1. Angina pectoris ([place])   2. History of pulmonary embolus (PE)   3. Mixed hyperlipidemia   4. Chronic anticoagulation   5. Essential hypertension    PLAN:    In order of problems listed above:  1. Angina pectoris: Patient symptoms are very concerning and I discussed this with the patient at extensive length.  Late last day she has had stress test which was unremarkable.I discussed coronary angiography and left heart catheterization with the patient at extensive length. Procedure, benefits and potential risks were explained. Patient had multiple questions which were answered to the patient's satisfaction. Patient agreed and consented for the procedure. Further recommendations will be made based on the findings of the coronary angiography. In the interim. The patient has any significant symptoms he knows to go to the nearest emergency room. 2. Essential hypertension: Blood pressure is elevated.  I will increase her isosorbide mononitrate to 120 mg daily. 3. Obesity: Diet was discussed.  Importance of diet stressed and she vocalized understanding.  Further recommendations will be made based on the findings of the coronary angiography.  Patient knows to go to the nearest emergency room for any concerning symptoms.  Patient and family member had multiple questions which were answered to their satisfaction.  She is on anticoagulation and that will have to be withheld for a short period of time for the test.  Benefits and risks explained and she is agreeable.   Medication Adjustments/Labs and Tests Ordered: Current medicines are reviewed at length with the patient today.  Concerns regarding medicines are outlined above.  No orders of the defined types were placed in this  encounter.  No orders of the defined types were placed in this encounter.    Chief Complaint  Patient presents with  . Follow-up    Per Dr. Micheal Likens for BP     History of Present Illness:    Jennifer Lucero is a 68 y.o. female.  Patient has past medical history of pulmonary embolism, essential hypertension and dyslipidemia.  She is morbidly obese.  She gives history suggesting of angina pectoris.  She mentions to me that she when she exerts she gets out of breath also has substernal chest tightness radiating to the neck.  She happens to have the symptoms once or twice a week.  She uses nitroglycerin with relief.  At the time of my evaluation, the patient is alert awake oriented and in no distress.  Past Medical History:  Diagnosis Date  . Anemia   . Anxiety 06/15/2017  . Asthma 06/15/2017  . Chest pain 06/15/2017  . Diabetic neuropathy ([place]) 06/15/2017  . Esophagitis, reflux 06/15/2017  . Hyperlipidemia 06/15/2017  . Knee pain   . Lumbago   . Osteoporosis 06/15/2017  . Pulmonary embolism ([place]) 06/15/2017  . Stroke [facility]) 06/15/2017    Past Surgical History:  Procedure Laterality Date  . HERNIA REPAIR    . IR RADIOLOGIST EVAL & MGMT  11/06/2017    Current Medications: Current Meds  Medication Sig  . albuterol (PROVENTIL HFA;VENTOLIN HFA) 108 (90 Base) MCG/ACT inhaler Inhale 2 puffs into the lungs every 4 (four) hours as needed.  . ALPRAZolam (XANAX) 0.5 MG tablet Take 1 tablet by mouth daily.  [name] Kitchen  apixaban (ELIQUIS) 5 MG TABS tablet Take 1 tablet by mouth daily.  [name] Kitchen aspirin EC 81 MG tablet Take 1 tablet by mouth daily.  [name] Kitchen esomeprazole (NEXIUM) 40 MG capsule Take 1 capsule by mouth 2 (two) times daily.  . Fe Fum-FA-B Cmp-C-Zn-Mg-Mn-Cu (FERROCITE PLUS) 106-1 MG TABS Take 1 tablet by mouth 2 (two) times daily.  [name] Kitchen glimepiride (AMARYL) 4 MG tablet Take 1 tablet by mouth daily.  . Insulin Detemir (LEVEMIR FLEXTOUCH) 100 UNIT/ML Pen inject 35 units under skin daily  . isosorbide mononitrate  (IMDUR) 60 MG 24 hr tablet Take 1 tablet by mouth daily.  [name] Kitchen lisinopril (PRINIVIL,ZESTRIL) 40 MG tablet Take 1 tablet by mouth daily.  [name] Kitchen loratadine (CLARITIN) 10 MG tablet Take 1 tablet by mouth daily.  . metFORMIN (GLUCOPHAGE) 500 MG tablet Take 1 tablet by mouth 2 (two) times daily.  . metoprolol succinate (TOPROL-XL) 25 MG 24 hr tablet Take 25 mg by mouth at bedtime.  . nitroGLYCERIN (NITROSTAT) 0.4 MG SL tablet Place 1 tablet under the tongue every 5 (five) minutes as needed.  . pravastatin (PRAVACHOL) 10 MG tablet Take 10 mg by mouth daily.  . traMADol (ULTRAM) 50 MG tablet Take 1 tablet by mouth daily.  . Vitamin D, Ergocalciferol, (DRISDOL) 50000 units CAPS capsule Take 1 capsule by mouth once a week.     Allergies:   Buspar [buspirone], Ciprofloxacin, Ciprofloxacin-fluocinolone pf, and Codeine   Social History   Socioeconomic History  . Marital status: Married    Spouse name: Not on file  . Number of children: Not on file  . Years of education: Not on file  . Highest education level: Not on file  Occupational History  . Not on file  Social Needs  . Financial resource strain: Not on file  . Food insecurity    Worry: Not on file    Inability: Not on file  . Transportation needs    Medical: Not on file    Non-medical: Not on file  Tobacco Use  . Smoking status: Never Smoker  . Smokeless tobacco: Never Used  Substance and Sexual Activity  . Alcohol use: Not Currently  . Drug use: Not Currently  . Sexual activity: Not on file  Lifestyle  . Physical activity    Days per week: Not on file    Minutes per session: Not on file  . Stress: Not on file  Relationships  . Social Herbalist on phone: Not on file    Gets together: Not on file    Attends religious service: Not on file    Active member of club or organization: Not on file    Attends meetings of clubs or organizations: Not on file    Relationship status: Not on file  Other Topics Concern  . Not on  file  Social History Narrative  . Not on file     Family History: The patient's family history includes Diabetes in her brother; Heart disease in her mother.  ROS:   Please see the history of present illness.    All other systems reviewed and are negative.  EKGs/Labs/Other Studies Reviewed:    The following studies were reviewed today: EKG reveals sinus rhythm, right bundle branch block and left anterior fascicular block and nonspecific ST-T changes.   Recent Labs: No results found for requested labs within last 8760 hours.  Recent Lipid Panel No results found for: CHOL, TRIG, HDL, CHOLHDL, VLDL, LDLCALC, LDLDIRECT  Physical Exam:  VS:  BP (!) 160/80 (BP Location: Right Arm, Patient Position: Sitting, Cuff Size: Normal)   Pulse 74   Ht 5\' 4"  (1.626 m)   Wt 221 lb (100.2 kg)   SpO2 94%   BMI 37.93 kg/m     Wt Readings from Last 3 Encounters:  10/24/18 221 lb (100.2 kg)  11/06/17 212 lb (96.2 kg)  11/01/17 216 lb (98 kg)     GEN: Patient is in no acute distress HEENT: Normal NECK: No JVD; No carotid bruits LYMPHATICS: No lymphadenopathy CARDIAC: Hear sounds regular, 2/6 systolic murmur at the apex. RESPIRATORY:  Clear to auscultation without rales, wheezing or rhonchi  ABDOMEN: Soft, non-tender, non-distended MUSCULOSKELETAL:  No edema; No deformity  SKIN: Warm and dry NEUROLOGIC:  Alert and oriented x 3 PSYCHIATRIC:  Normal affect   Signed, Jenean Lindau, MD  10/24/2018 3:36 PM    [facility]

## 2018-10-24 NOTE — Addendum Note (Signed)
Addended by: Beckey Rutter on: 10/24/2018 04:10 PM   Modules accepted: Orders

## 2018-10-25 DIAGNOSIS — J9811 Atelectasis: Secondary | ICD-10-CM | POA: Diagnosis not present

## 2018-10-25 DIAGNOSIS — I209 Angina pectoris, unspecified: Secondary | ICD-10-CM | POA: Diagnosis not present

## 2018-10-25 DIAGNOSIS — R918 Other nonspecific abnormal finding of lung field: Secondary | ICD-10-CM | POA: Diagnosis not present

## 2018-10-25 LAB — CBC
Hematocrit: 40.6 % (ref 34.0–46.6)
Hemoglobin: 13.2 g/dL (ref 11.1–15.9)
MCH: 29.3 pg (ref 26.6–33.0)
MCHC: 32.5 g/dL (ref 31.5–35.7)
MCV: 90 fL (ref 79–97)
Platelets: 450 10*3/uL (ref 150–450)
RBC: 4.5 x10E6/uL (ref 3.77–5.28)
RDW: 13.1 % (ref 11.7–15.4)
WBC: 12.3 10*3/uL — ABNORMAL HIGH (ref 3.4–10.8)

## 2018-10-25 LAB — BASIC METABOLIC PANEL
BUN/Creatinine Ratio: 18 (ref 12–28)
BUN: 20 mg/dL (ref 8–27)
CO2: 25 mmol/L (ref 20–29)
Calcium: 9.9 mg/dL (ref 8.7–10.3)
Chloride: 103 mmol/L (ref 96–106)
Creatinine, Ser: 1.11 mg/dL — ABNORMAL HIGH (ref 0.57–1.00)
GFR calc Af Amer: 58 mL/min/{1.73_m2} — ABNORMAL LOW (ref >59)
GFR calc non Af Amer: 50 mL/min/{1.73_m2} — ABNORMAL LOW (ref >59)
Glucose: 166 mg/dL — ABNORMAL HIGH (ref 65–99)
Potassium: 4.3 mmol/L (ref 3.5–5.2)
Sodium: 144 mmol/L (ref 134–144)

## 2018-10-28 ENCOUNTER — Other Ambulatory Visit (HOSPITAL_COMMUNITY)
Admission: RE | Admit: 2018-10-28 | Discharge: 2018-10-28 | Disposition: A | Discharge status: Home / Self Care | Payer: Medicare Other | Source: Ambulatory Visit | Attending: Interventional Cardiology | Admitting: Interventional Cardiology

## 2018-10-28 DIAGNOSIS — I209 Angina pectoris, unspecified: Secondary | ICD-10-CM | POA: Diagnosis not present

## 2018-10-28 DIAGNOSIS — Z20828 Contact with and (suspected) exposure to other viral communicable diseases: Secondary | ICD-10-CM | POA: Insufficient documentation

## 2018-10-28 DIAGNOSIS — Z01812 Encounter for preprocedural laboratory examination: Secondary | ICD-10-CM | POA: Diagnosis not present

## 2018-10-28 DIAGNOSIS — R0602 Shortness of breath: Secondary | ICD-10-CM | POA: Insufficient documentation

## 2018-10-29 LAB — NOVEL CORONAVIRUS, NAA (HOSP ORDER, SEND-OUT TO REF LAB; TAT 18-24 HRS): SARS-CoV-2, NAA: NOT DETECTED

## 2018-10-31 ENCOUNTER — Telehealth: Payer: Self-pay | Admitting: *Deleted

## 2018-10-31 NOTE — H&P (Signed)
   Negative Nuclear 2019/Nov  RBBB with LAHB  Chest pain

## 2018-10-31 NOTE — Telephone Encounter (Signed)
Pt contacted pre-catheterization scheduled at [facility] for: Friday November 01, 2018 12 noon Verified arrival time and place: [place] [facility]) at: 10 AM   No solid food after midnight prior to cath, clear liquids until 5 AM day of procedure. Contrast allergy: no  Hold: Eliquis-per pt last dose AM 10/30/18 until post procedure. Insulin-AM of procedure-pt states she does not take Insulin HS. Glimepiride-AM of procedure. Meformin-day of procedure and 48 hours post procedure. Lisinopril-AM of procedure-GFR 50  Except hold medications AM meds can be  taken pre-cath with sip of water including: ASA 81 mg   Confirmed patient has responsible adult to drive home post procedure and observe 24 hours after arriving home: yes  Currently, due to Covid-19 pandemic, only one support person will be allowed with patient. Must be the same support person for that patient's entire stay, will be screened and required to wear a mask. They will be asked to wait in the waiting room for the duration of the patient's stay.  Patients are required to wear a mask when they enter the hospital.      COVID-19 Pre-Screening Questions:  . In the past 7 to 10 days have you had a cough,  shortness of breath, headache, congestion, fever (100 or greater) body aches, chills, sore throat, or sudden loss of taste or sense of smell? no . Have you been around anyone with known Covid 19? no . Have you been around anyone who is awaiting Covid 19 test results in the past 7 to 10 days? no . Have you been around anyone who has been exposed to Covid 19, or has mentioned symptoms of Covid 19 within the past 7 to 10 days? no  I reviewed procedure/mask/visitor instructions, Covid-19 screening questions with patient, she verbalized understanding.

## 2018-11-01 ENCOUNTER — Ambulatory Visit (HOSPITAL_COMMUNITY)
Admission: RE | Admit: 2018-11-01 | Discharge: 2018-11-01 | Disposition: A | Discharge status: Home / Self Care | Payer: Medicare Other | Attending: Interventional Cardiology | Admitting: Interventional Cardiology

## 2018-11-01 ENCOUNTER — Encounter (HOSPITAL_COMMUNITY)
Admission: RE | Disposition: A | Discharge status: Home / Self Care | Payer: Medicare Other | Source: Home / Self Care | Attending: Interventional Cardiology

## 2018-11-01 ENCOUNTER — Other Ambulatory Visit: Payer: Self-pay

## 2018-11-01 DIAGNOSIS — Z794 Long term (current) use of insulin: Secondary | ICD-10-CM | POA: Diagnosis not present

## 2018-11-01 DIAGNOSIS — E1140 Type 2 diabetes mellitus with diabetic neuropathy, unspecified: Secondary | ICD-10-CM | POA: Insufficient documentation

## 2018-11-01 DIAGNOSIS — E785 Hyperlipidemia, unspecified: Secondary | ICD-10-CM | POA: Diagnosis present

## 2018-11-01 DIAGNOSIS — Z7982 Long term (current) use of aspirin: Secondary | ICD-10-CM | POA: Diagnosis not present

## 2018-11-01 DIAGNOSIS — I10 Essential (primary) hypertension: Secondary | ICD-10-CM | POA: Diagnosis not present

## 2018-11-01 DIAGNOSIS — E669 Obesity, unspecified: Secondary | ICD-10-CM | POA: Insufficient documentation

## 2018-11-01 DIAGNOSIS — Z7901 Long term (current) use of anticoagulants: Secondary | ICD-10-CM | POA: Diagnosis not present

## 2018-11-01 DIAGNOSIS — Z881 Allergy status to other antibiotic agents status: Secondary | ICD-10-CM | POA: Diagnosis not present

## 2018-11-01 DIAGNOSIS — E782 Mixed hyperlipidemia: Secondary | ICD-10-CM | POA: Diagnosis not present

## 2018-11-01 DIAGNOSIS — Z6837 Body mass index (BMI) 37.0-37.9, adult: Secondary | ICD-10-CM | POA: Diagnosis not present

## 2018-11-01 DIAGNOSIS — Z86711 Personal history of pulmonary embolism: Secondary | ICD-10-CM | POA: Insufficient documentation

## 2018-11-01 DIAGNOSIS — Z8249 Family history of ischemic heart disease and other diseases of the circulatory system: Secondary | ICD-10-CM | POA: Diagnosis not present

## 2018-11-01 DIAGNOSIS — Z79899 Other long term (current) drug therapy: Secondary | ICD-10-CM | POA: Insufficient documentation

## 2018-11-01 DIAGNOSIS — I209 Angina pectoris, unspecified: Secondary | ICD-10-CM | POA: Diagnosis not present

## 2018-11-01 DIAGNOSIS — F419 Anxiety disorder, unspecified: Secondary | ICD-10-CM | POA: Diagnosis not present

## 2018-11-01 DIAGNOSIS — M810 Age-related osteoporosis without current pathological fracture: Secondary | ICD-10-CM | POA: Diagnosis not present

## 2018-11-01 DIAGNOSIS — J45909 Unspecified asthma, uncomplicated: Secondary | ICD-10-CM | POA: Insufficient documentation

## 2018-11-01 DIAGNOSIS — Z8673 Personal history of transient ischemic attack (TIA), and cerebral infarction without residual deficits: Secondary | ICD-10-CM | POA: Insufficient documentation

## 2018-11-01 DIAGNOSIS — K219 Gastro-esophageal reflux disease without esophagitis: Secondary | ICD-10-CM | POA: Diagnosis not present

## 2018-11-01 DIAGNOSIS — Z885 Allergy status to narcotic agent status: Secondary | ICD-10-CM | POA: Diagnosis not present

## 2018-11-01 HISTORY — PX: LEFT HEART CATH AND CORONARY ANGIOGRAPHY: CATH118249

## 2018-11-01 LAB — GLUCOSE, CAPILLARY
Glucose-Capillary: 137 mg/dL — ABNORMAL HIGH (ref 70–99)
Glucose-Capillary: 126 mg/dL — ABNORMAL HIGH (ref 70–99)

## 2018-11-01 SURGERY — LEFT HEART CATH AND CORONARY ANGIOGRAPHY
Anesthesia: LOCAL

## 2018-11-01 MED ORDER — MIDAZOLAM HCL 2 MG/2ML IJ SOLN
INTRAMUSCULAR | Status: AC
  Filled 2018-11-01: qty 2

## 2018-11-01 MED ORDER — HYDRALAZINE HCL 20 MG/ML IJ SOLN: 10.0000 mg | INTRAMUSCULAR | Status: DC | PRN

## 2018-11-01 MED ORDER — SODIUM CHLORIDE 0.9 % IV SOLN: 250.0000 mL | INTRAVENOUS | Status: DC | PRN

## 2018-11-01 MED ORDER — SODIUM CHLORIDE 0.9 % IV SOLN: INTRAVENOUS | Status: DC

## 2018-11-01 MED ORDER — FENTANYL CITRATE (PF) 100 MCG/2ML IJ SOLN
INTRAMUSCULAR | Status: AC
  Filled 2018-11-01: qty 2

## 2018-11-01 MED ORDER — LIDOCAINE HCL (PF) 1 % IJ SOLN
INTRAMUSCULAR | Status: AC
  Filled 2018-11-01: qty 30

## 2018-11-01 MED ORDER — ONDANSETRON HCL 4 MG/2ML IJ SOLN: 4.0000 mg | Freq: Four times a day (QID) | INTRAMUSCULAR | Status: DC | PRN

## 2018-11-01 MED ORDER — FENTANYL CITRATE (PF) 100 MCG/2ML IJ SOLN
INTRAMUSCULAR | Status: DC | PRN
  Administered 2018-11-01: 25 ug via INTRAVENOUS

## 2018-11-01 MED ORDER — SODIUM CHLORIDE 0.9% FLUSH: 3.0000 mL | INTRAVENOUS | Status: DC | PRN

## 2018-11-01 MED ORDER — ACETAMINOPHEN 325 MG PO TABS: 650.0000 mg | ORAL_TABLET | ORAL | Status: DC | PRN

## 2018-11-01 MED ORDER — SODIUM CHLORIDE 0.9 % WEIGHT BASED INFUSION
3.0000 mL/kg/h | INTRAVENOUS | Status: DC
  Administered 2018-11-01: 3 mL/kg/h via INTRAVENOUS

## 2018-11-01 MED ORDER — SODIUM CHLORIDE 0.9% FLUSH: 3.0000 mL | Freq: Two times a day (BID) | INTRAVENOUS | Status: DC

## 2018-11-01 MED ORDER — IOHEXOL 350 MG/ML SOLN
INTRAVENOUS | Status: DC | PRN
  Administered 2018-11-01: 50 mL

## 2018-11-01 MED ORDER — LABETALOL HCL 5 MG/ML IV SOLN: 10.0000 mg | INTRAVENOUS | Status: DC | PRN

## 2018-11-01 MED ORDER — VERAPAMIL HCL 2.5 MG/ML IV SOLN
INTRAVENOUS | Status: AC
  Filled 2018-11-01: qty 2

## 2018-11-01 MED ORDER — HEPARIN SODIUM (PORCINE) 1000 UNIT/ML IJ SOLN
INTRAMUSCULAR | Status: DC | PRN
  Administered 2018-11-01: 5000 [IU] via INTRAVENOUS

## 2018-11-01 MED ORDER — VERAPAMIL HCL 2.5 MG/ML IV SOLN
INTRAVENOUS | Status: DC | PRN
  Administered 2018-11-01: 10 mL via INTRA_ARTERIAL

## 2018-11-01 MED ORDER — MIDAZOLAM HCL 2 MG/2ML IJ SOLN
INTRAMUSCULAR | Status: DC | PRN
  Administered 2018-11-01 (×2): 1 mg via INTRAVENOUS

## 2018-11-01 MED ORDER — LIDOCAINE HCL (PF) 1 % IJ SOLN
INTRAMUSCULAR | Status: DC | PRN
  Administered 2018-11-01: 2 mL

## 2018-11-01 MED ORDER — ASPIRIN 81 MG PO CHEW: 81.0000 mg | CHEWABLE_TABLET | ORAL | Status: DC

## 2018-11-01 MED ORDER — HEPARIN (PORCINE) IN NACL 1000-0.9 UT/500ML-% IV SOLN
INTRAVENOUS | Status: AC
  Filled 2018-11-01: qty 1000

## 2018-11-01 MED ORDER — HEPARIN (PORCINE) IN NACL 1000-0.9 UT/500ML-% IV SOLN
INTRAVENOUS | Status: DC | PRN
  Administered 2018-11-01 (×2): 500 mL

## 2018-11-01 MED ORDER — SODIUM CHLORIDE 0.9 % WEIGHT BASED INFUSION: 1.0000 mL/kg/h | INTRAVENOUS | Status: DC

## 2018-11-01 SURGICAL SUPPLY — 11 items
CATH 5FR JL3.5 JR4 ANG PIG MP (CATHETERS) ×1 IMPLANT
DEVICE RAD COMP TR BAND LRG (VASCULAR PRODUCTS) ×1 IMPLANT
GLIDESHEATH SLEND A-KIT 6F 22G (SHEATH) ×1 IMPLANT
GUIDEWIRE INQWIRE 1.5J.035X260 (WIRE) IMPLANT
INQWIRE 1.5J .035X260CM (WIRE) ×2
KIT HEART LEFT (KITS) ×2 IMPLANT
PACK CARDIAC CATHETERIZATION (CUSTOM PROCEDURE TRAY) ×2 IMPLANT
SHEATH PROBE COVER 6X72 (BAG) ×1 IMPLANT
TRANSDUCER W/STOPCOCK (MISCELLANEOUS) ×2 IMPLANT
TUBING CIL FLEX 10 FLL-RA (TUBING) ×2 IMPLANT
WIRE HI TORQ VERSACORE-J 145CM (WIRE) ×1 IMPLANT

## 2018-11-01 NOTE — Progress Notes (Addendum)
Splint applied to right arm. Bryan form cath lab into assess right wrist. States he  Will check with Dr Tamala Julian to see if ok for pt to Discharge. No changes noted at this time

## 2018-11-01 NOTE — Discharge Instructions (Signed)
Drink plenty of fluids °Keep right arm at or above heart level  °Radial Site Care ° °This sheet gives you information about how to care for yourself after your procedure. Your health care provider may also give you more specific instructions. If you have problems or questions, contact your health care provider. °What can I expect after the procedure? °After the procedure, it is common to have: °· Bruising and tenderness at the catheter insertion area. °Follow these instructions at home: °Medicines °· Take over-the-counter and prescription medicines only as told by your health care provider. °Insertion site care °· Follow instructions from your health care provider about how to take care of your insertion site. Make sure you: °? Wash your hands with soap and water before you change your bandage (dressing). If soap and water are not available, use hand sanitizer. °? Change your dressing as told by your health care provider. °? Leave stitches (sutures), skin glue, or adhesive strips in place. These skin closures may need to stay in place for 2 weeks or longer. If adhesive strip edges start to loosen and curl up, you may trim the loose edges. Do not remove adhesive strips completely unless your health care provider tells you to do that. °· Check your insertion site every day for signs of infection. Check for: °? Redness, swelling, or pain. °? Fluid or blood. °? Pus or a bad smell. °? Warmth. °· Do not take baths, swim, or use a hot tub until your health care provider approves. °· You may shower 24-48 hours after the procedure, or as directed by your health care provider. °? Remove the dressing and gently wash the site with plain soap and water. °? Pat the area dry with a clean towel. °? Do not rub the site. That could cause bleeding. °· Do not apply powder or lotion to the site. °Activity ° °· For 24 hours after the procedure, or as directed by your health care provider: °? Do not flex or bend the affected arm. °? Do  not push or pull heavy objects with the affected arm. °? Do not drive yourself home from the hospital or clinic. You may drive 24 hours after the procedure unless your health care provider tells you not to. °? Do not operate machinery or power tools. °· Do not lift anything that is heavier than 10 lb (4.5 kg), or the limit that you are told, until your health care provider says that it is safe. °· Ask your health care provider when it is okay to: °? Return to work or school. °? Resume usual physical activities or sports. °? Resume sexual activity. °General instructions °· If the catheter site starts to bleed, raise your arm and put firm pressure on the site. If the bleeding does not stop, get help right away. This is a medical emergency. °· If you went home on the same day as your procedure, a responsible adult should be with you for the first 24 hours after you arrive home. °· Keep all follow-up visits as told by your health care provider. This is important. °Contact a health care provider if: °· You have a fever. °· You have redness, swelling, or yellow drainage around your insertion site. °Get help right away if: °· You have unusual pain at the radial site. °· The catheter insertion area swells very fast. °· The insertion area is bleeding, and the bleeding does not stop when you hold steady pressure on the area. °· Your arm or hand   becomes pale, cool, tingly, or numb. °These symptoms may represent a serious problem that is an emergency. Do not wait to see if the symptoms will go away. Get medical help right away. Call your local emergency services (911 in the [place] Do not drive yourself to the hospital. °Summary °· After the procedure, it is common to have bruising and tenderness at the site. °· Follow instructions from your health care provider about how to take care of your radial site wound. Check the wound every day for signs of infection. °· Do not lift anything that is heavier than 10 lb (4.5 kg), or the  limit that you are told, until your health care provider says that it is safe. °This information is not intended to replace advice given to you by your health care provider. Make sure you discuss any questions you have with your health care provider. °Document Released: 02/11/2010 Document Revised: 02/14/2017 Document Reviewed: 02/14/2017 °Elsevier Patient Education © 2020 [employer] ° °

## 2018-11-01 NOTE — Progress Notes (Signed)
After TRB removed and dressing applied  Bleeding started when pt started to get dressed Pressure held and arm elevated.  Dressing changed small amt busing noted with small hematoma. Reassessed after pressure held area remains with busing, but hematoma no longer noted. Dr Tamala Julian called and informed.

## 2018-11-01 NOTE — Interval H&P Note (Signed)
Cath Lab Visit (complete for each Cath Lab visit)  Clinical Evaluation Leading to the Procedure:   ACS: Yes.    Non-ACS:    Anginal Classification: CCS III  Anti-ischemic medical therapy: Minimal Therapy (1 class of medications)  Non-Invasive Test Results: Low-risk stress test findings: cardiac mortality <1%/year  Prior CABG: No previous CABG      History and Physical Interval Note:  11/01/2018 2:19 PM  Jennifer Lucero  has presented today for surgery, with the diagnosis of Angina.  The various methods of treatment have been discussed with the patient and family. After consideration of risks, benefits and other options for treatment, the patient has consented to  Procedure(s): LEFT HEART CATH AND CORONARY ANGIOGRAPHY (N/A) as a surgical intervention.  The patient's history has been reviewed, patient examined, no change in status, stable for surgery.  I have reviewed the patient's chart and labs.  Questions were answered to the patient's satisfaction.     Belva Crome III

## 2018-11-01 NOTE — Progress Notes (Signed)
Ok to discharge home per Dr Tamala Julian.

## 2018-11-01 NOTE — Progress Notes (Addendum)
Jennifer Lucero from cath lab came over to assess TRB site. States he will let Dr Tamala Julian know. Will monitor site for al least 30 more minutes or as directed by Dr Tamala Julian.

## 2018-11-01 NOTE — Progress Notes (Signed)
Discharge instructions reviewed with pt and her husband. Both voice understanding.  

## 2018-11-01 NOTE — CV Procedure (Signed)
   Left heart cath with coronary angiography via right radial approach.  Real-time vascular ultrasound for access.  Normal coronary arteries.  Normal LVEDP.  LVEF 55%.

## 2018-11-04 ENCOUNTER — Encounter (HOSPITAL_COMMUNITY): Payer: Self-pay | Admitting: Interventional Cardiology

## 2018-11-05 ENCOUNTER — Telehealth: Payer: Self-pay

## 2018-11-05 NOTE — Telephone Encounter (Signed)
-----   Message from Jenean Lindau, MD sent at 10/25/2018  8:22 AM EDT ----- The results of the study is unremarkable. Please inform patient. I will discuss in detail at next appointment. Cc  primary care/referring physician Jenean Lindau, MD 10/25/2018 8:22 AM

## 2018-11-05 NOTE — Telephone Encounter (Signed)
Results relayed, copy sent to Dr. Micheal Likens.

## 2018-11-12 DIAGNOSIS — L299 Pruritus, unspecified: Secondary | ICD-10-CM | POA: Diagnosis not present

## 2018-11-12 DIAGNOSIS — L821 Other seborrheic keratosis: Secondary | ICD-10-CM | POA: Diagnosis not present

## 2018-11-28 ENCOUNTER — Ambulatory Visit: Payer: Medicare Other | Admitting: Cardiology

## 2018-11-28 DIAGNOSIS — E119 Type 2 diabetes mellitus without complications: Secondary | ICD-10-CM | POA: Diagnosis not present

## 2018-11-28 DIAGNOSIS — H25811 Combined forms of age-related cataract, right eye: Secondary | ICD-10-CM | POA: Diagnosis not present

## 2018-11-28 DIAGNOSIS — H2522 Age-related cataract, morgagnian type, left eye: Secondary | ICD-10-CM | POA: Diagnosis not present

## 2018-11-28 DIAGNOSIS — Z794 Long term (current) use of insulin: Secondary | ICD-10-CM | POA: Diagnosis not present

## 2018-11-29 ENCOUNTER — Other Ambulatory Visit: Payer: Self-pay

## 2018-11-29 ENCOUNTER — Encounter: Payer: Self-pay | Admitting: Cardiology

## 2018-11-29 ENCOUNTER — Ambulatory Visit (INDEPENDENT_AMBULATORY_CARE_PROVIDER_SITE_OTHER): Payer: Medicare Other | Admitting: Cardiology

## 2018-11-29 VITALS — BP 150/92 | HR 74 | Ht 64.0 in | Wt 223.0 lb

## 2018-11-29 DIAGNOSIS — Z86711 Personal history of pulmonary embolism: Secondary | ICD-10-CM | POA: Diagnosis not present

## 2018-11-29 DIAGNOSIS — I10 Essential (primary) hypertension: Secondary | ICD-10-CM | POA: Diagnosis not present

## 2018-11-29 DIAGNOSIS — Z7901 Long term (current) use of anticoagulants: Secondary | ICD-10-CM | POA: Diagnosis not present

## 2018-11-29 DIAGNOSIS — E782 Mixed hyperlipidemia: Secondary | ICD-10-CM

## 2018-11-29 NOTE — Progress Notes (Signed)
Cardiology Office Note:    Date:  11/29/2018   ID:  Jennifer Lucero, DOB September 19, 1950, MRN [id number]  PCP:  Ocie Doyne., MD  Cardiologist:  Jenean Lindau, MD   Referring MD: Ocie Doyne., MD    ASSESSMENT:    1. Essential hypertension   2. Mixed hyperlipidemia   3. History of pulmonary embolus (PE)   4. Chronic anticoagulation    PLAN:    In order of problems listed above:  1. Chest discomfort: Coronary angiography was largely unremarkable and clean coronary arteries.  Primary prevention stressed with the patient.  Importance of compliance with diet and medication stressed exercise and weight reduction was stressed and she vocalized understanding.  She is following up with her primary care physician about her hip pain issues. 2. History of pulmonary embolus: On chronic anticoagulation.  Benefits and potential is explained and she vocalized understanding 3. Mixed dyslipidemia and obesity: Diet was discussed.  Importance of weight reduction stressed.  Risks of obesity explained and she understood.Patient will be seen in follow-up appointment in 6 months or earlier if the patient has any concerns    Medication Adjustments/Labs and Tests Ordered: Current medicines are reviewed at length with the patient today.  Concerns regarding medicines are outlined above.  No orders of the defined types were placed in this encounter.  No orders of the defined types were placed in this encounter.    Chief Complaint  Patient presents with  . Follow-up     History of Present Illness:    Jennifer Lucero is a 68 y.o. female.  Patient was evaluated for chest pain and underwent coronary angiography which revealed anatomically normal-appearing coronary arteries.  Subsequently she is done fine.  She has had a history of multiple pulmonary emboli and so she is on anticoagulation.  This is managed by primary care physician.  Currently she is in significant hip pain also managed by her primary  care physician and it appears this is the reason for her elevated blood pressure.  At the time of my evaluation, the patient is alert awake oriented and in no distress.  Past Medical History:  Diagnosis Date  . Anemia   . Anxiety 06/15/2017  . Asthma 06/15/2017  . Chest pain 06/15/2017  . Diabetic neuropathy ([place]) 06/15/2017  . Esophagitis, reflux 06/15/2017  . Hyperlipidemia 06/15/2017  . Knee pain   . Lumbago   . Osteoporosis 06/15/2017  . Pulmonary embolism ([place]) 06/15/2017  . Stroke [facility]) 06/15/2017    Past Surgical History:  Procedure Laterality Date  . HERNIA REPAIR    . IR RADIOLOGIST EVAL & MGMT  11/06/2017  . LEFT HEART CATH AND CORONARY ANGIOGRAPHY N/A 11/01/2018   Procedure: LEFT HEART CATH AND CORONARY ANGIOGRAPHY;  Surgeon: Belva Crome, MD;  Location: [place] [facility];  Service: Cardiovascular;  Laterality: N/A;    Current Medications: Current Meds  Medication Sig  . albuterol (PROVENTIL HFA;VENTOLIN HFA) 108 (90 Base) MCG/ACT inhaler Inhale 2 puffs into the lungs every 4 (four) hours as needed for wheezing or shortness of breath.   . ALPRAZolam (XANAX) 0.5 MG tablet Take 0.25 mg by mouth 2 (two) times daily.   [name] Kitchen apixaban (ELIQUIS) 5 MG TABS tablet Take 5 mg by mouth 2 (two) times daily.   [name] Kitchen aspirin EC 81 MG tablet Take 1 tablet by mouth daily.  [name] Kitchen esomeprazole (NEXIUM) 40 MG capsule Take 1 capsule by mouth daily.   . Fe Fum-FA-B Cmp-C-Zn-Mg-Mn-Cu (FERROCITE PLUS)  106-1 MG TABS Take 1 tablet by mouth daily.   [name] Kitchen glimepiride (AMARYL) 4 MG tablet Take 4 mg by mouth daily.   [name] Kitchen ibuprofen (ADVIL) 200 MG tablet Take 400 mg by mouth every 6 (six) hours as needed for mild pain.  . Insulin Detemir (LEVEMIR FLEXTOUCH) 100 UNIT/ML Pen Inject 52 Units into the skin daily.   . isosorbide mononitrate (IMDUR) 120 MG 24 hr tablet Take 1 tablet (120 mg total) by mouth daily.  [name] Kitchen lisinopril (PRINIVIL,ZESTRIL) 40 MG tablet Take 40 mg by mouth daily.   [name] Kitchen loratadine (CLARITIN) 10 MG tablet  Take 1 tablet by mouth daily as needed for allergies.   . metFORMIN (GLUCOPHAGE) 500 MG tablet Take 500 mg by mouth 2 (two) times daily with a meal.   . metoprolol succinate (TOPROL-XL) 25 MG 24 hr tablet Take 25 mg by mouth at bedtime.  . nitroGLYCERIN (NITROSTAT) 0.4 MG SL tablet Place 1 tablet under the tongue every 5 (five) minutes as needed for chest pain.   [name] Kitchen omeprazole (PRILOSEC) 20 MG capsule Take 20 mg by mouth daily.  . pravastatin (PRAVACHOL) 10 MG tablet Take 10 mg by mouth daily.  . predniSONE (DELTASONE) 10 MG tablet 4 TABS DAILY FOR 2 DAYS, 3 TABS DAILY FOR 2 DAYS, 2 TABS DAILY FOR 2 DAYS, 1 TAB DAILY FOR 2 DAYS  . tiZANidine (ZANAFLEX) 4 MG tablet Take 2-4 mg by mouth 3 (three) times daily as needed.  . traMADol (ULTRAM) 50 MG tablet Take 1 tablet by mouth daily as needed for moderate pain.   . Vitamin D, Ergocalciferol, (DRISDOL) 50000 units CAPS capsule Take 50,000 Units by mouth every Sunday.      Allergies:   Buspar [buspirone], Ciprofloxacin, Ciprofloxacin-fluocinolone pf, and Codeine   Social History   Socioeconomic History  . Marital status: Married    Spouse name: Not on file  . Number of children: Not on file  . Years of education: Not on file  . Highest education level: Not on file  Occupational History  . Not on file  Social Needs  . Financial resource strain: Not on file  . Food insecurity    Worry: Not on file    Inability: Not on file  . Transportation needs    Medical: Not on file    Non-medical: Not on file  Tobacco Use  . Smoking status: Never Smoker  . Smokeless tobacco: Never Used  Substance and Sexual Activity  . Alcohol use: Not Currently  . Drug use: Not Currently  . Sexual activity: Not on file  Lifestyle  . Physical activity    Days per week: Not on file    Minutes per session: Not on file  . Stress: Not on file  Relationships  . Social Herbalist on phone: Not on file    Gets together: Not on file    Attends religious  service: Not on file    Active member of club or organization: Not on file    Attends meetings of clubs or organizations: Not on file    Relationship status: Not on file  Other Topics Concern  . Not on file  Social History Narrative  . Not on file     Family History: The patient's family history includes Diabetes in her brother; Heart disease in her mother.  ROS:   Please see the history of present illness.    All other systems reviewed and are negative.  EKGs/Labs/Other Studies Reviewed:  The following studies were reviewed today: Belva Crome, MD Study date: 11/01/18  Physicians  Panel Physicians Referring Physician Case Authorizing Physician  Belva Crome, MD (Primary)    Procedures  LEFT HEART CATH AND CORONARY ANGIOGRAPHY  Conclusion   Normal coronary arteries.  Normal left ventricle with EF 55%.  LVEDP is normal.  No coronary calcification noted on cine fluoroscopy.  RECOMMENDATIONS:   Further management per Dr. Geraldo Pitter      Recent Labs: 10/24/2018: BUN 20; Creatinine, Ser 1.11; Hemoglobin 13.2; Platelets 450; Potassium 4.3; Sodium 144  Recent Lipid Panel No results found for: CHOL, TRIG, HDL, CHOLHDL, VLDL, LDLCALC, LDLDIRECT  Physical Exam:    VS:  BP (!) 150/92 (BP Location: Right Arm, Patient Position: Sitting, Cuff Size: Large)   Pulse 74   Ht 5\' 4"  (1.626 m)   Wt 223 lb (101.2 kg)   SpO2 96%   BMI 38.28 kg/m     Wt Readings from Last 3 Encounters:  11/29/18 223 lb (101.2 kg)  11/01/18 220 lb (99.8 kg)  10/24/18 221 lb (100.2 kg)     GEN: Patient is in no acute distress HEENT: Normal NECK: No JVD; No carotid bruits LYMPHATICS: No lymphadenopathy CARDIAC: Hear sounds regular, 2/6 systolic murmur at the apex. RESPIRATORY:  Clear to auscultation without rales, wheezing or rhonchi  ABDOMEN: Soft, non-tender, non-distended MUSCULOSKELETAL:  No edema; No deformity  SKIN: Warm and dry NEUROLOGIC:  Alert and oriented x  3 PSYCHIATRIC:  Normal affect   Signed, Jenean Lindau, MD  11/29/2018 4:01 PM    [facility]

## 2018-11-29 NOTE — Patient Instructions (Signed)

## 2018-12-09 DIAGNOSIS — M5136 Other intervertebral disc degeneration, lumbar region: Secondary | ICD-10-CM | POA: Diagnosis not present

## 2018-12-09 DIAGNOSIS — M25551 Pain in right hip: Secondary | ICD-10-CM | POA: Diagnosis not present

## 2018-12-09 DIAGNOSIS — M160 Bilateral primary osteoarthritis of hip: Secondary | ICD-10-CM | POA: Diagnosis not present

## 2018-12-09 DIAGNOSIS — M47816 Spondylosis without myelopathy or radiculopathy, lumbar region: Secondary | ICD-10-CM | POA: Diagnosis not present

## 2018-12-09 DIAGNOSIS — G8929 Other chronic pain: Secondary | ICD-10-CM | POA: Diagnosis not present

## 2018-12-11 DIAGNOSIS — M5410 Radiculopathy, site unspecified: Secondary | ICD-10-CM | POA: Diagnosis not present

## 2018-12-26 DIAGNOSIS — M5117 Intervertebral disc disorders with radiculopathy, lumbosacral region: Secondary | ICD-10-CM | POA: Diagnosis not present

## 2018-12-26 DIAGNOSIS — M4316 Spondylolisthesis, lumbar region: Secondary | ICD-10-CM | POA: Diagnosis not present

## 2018-12-26 DIAGNOSIS — M48061 Spinal stenosis, lumbar region without neurogenic claudication: Secondary | ICD-10-CM | POA: Diagnosis not present

## 2018-12-26 DIAGNOSIS — Z8739 Personal history of other diseases of the musculoskeletal system and connective tissue: Secondary | ICD-10-CM | POA: Diagnosis not present

## 2018-12-26 DIAGNOSIS — M5410 Radiculopathy, site unspecified: Secondary | ICD-10-CM | POA: Diagnosis not present

## 2018-12-26 DIAGNOSIS — M4726 Other spondylosis with radiculopathy, lumbar region: Secondary | ICD-10-CM | POA: Diagnosis not present

## 2019-01-08 DIAGNOSIS — M4800 Spinal stenosis, site unspecified: Secondary | ICD-10-CM | POA: Diagnosis not present

## 2019-01-13 DIAGNOSIS — E118 Type 2 diabetes mellitus with unspecified complications: Secondary | ICD-10-CM | POA: Diagnosis not present

## 2019-01-13 DIAGNOSIS — D649 Anemia, unspecified: Secondary | ICD-10-CM | POA: Diagnosis not present

## 2019-01-13 DIAGNOSIS — M545 Low back pain: Secondary | ICD-10-CM | POA: Diagnosis not present

## 2019-01-13 DIAGNOSIS — Z79899 Other long term (current) drug therapy: Secondary | ICD-10-CM | POA: Diagnosis not present

## 2019-01-13 DIAGNOSIS — M51369 Other intervertebral disc degeneration, lumbar region without mention of lumbar back pain or lower extremity pain: Secondary | ICD-10-CM

## 2019-01-13 DIAGNOSIS — G8929 Other chronic pain: Secondary | ICD-10-CM | POA: Diagnosis not present

## 2019-01-13 DIAGNOSIS — M25551 Pain in right hip: Secondary | ICD-10-CM | POA: Diagnosis not present

## 2019-01-13 DIAGNOSIS — M5416 Radiculopathy, lumbar region: Secondary | ICD-10-CM

## 2019-01-13 DIAGNOSIS — E785 Hyperlipidemia, unspecified: Secondary | ICD-10-CM | POA: Diagnosis not present

## 2019-01-13 DIAGNOSIS — M5116 Intervertebral disc disorders with radiculopathy, lumbar region: Secondary | ICD-10-CM | POA: Diagnosis not present

## 2019-01-13 DIAGNOSIS — E559 Vitamin D deficiency, unspecified: Secondary | ICD-10-CM | POA: Diagnosis not present

## 2019-01-13 DIAGNOSIS — M5136 Other intervertebral disc degeneration, lumbar region: Secondary | ICD-10-CM | POA: Diagnosis not present

## 2019-01-13 DIAGNOSIS — I10 Essential (primary) hypertension: Secondary | ICD-10-CM | POA: Diagnosis not present

## 2019-01-13 HISTORY — DX: Other intervertebral disc degeneration, lumbar region: M51.36

## 2019-01-13 HISTORY — DX: Radiculopathy, lumbar region: M54.16

## 2019-01-13 HISTORY — DX: Other intervertebral disc degeneration, lumbar region without mention of lumbar back pain or lower extremity pain: M51.369

## 2019-01-30 DIAGNOSIS — Z1231 Encounter for screening mammogram for malignant neoplasm of breast: Secondary | ICD-10-CM | POA: Diagnosis not present

## 2019-02-04 DIAGNOSIS — M545 Low back pain: Secondary | ICD-10-CM | POA: Diagnosis not present

## 2019-02-04 DIAGNOSIS — M5416 Radiculopathy, lumbar region: Secondary | ICD-10-CM | POA: Diagnosis not present

## 2019-02-04 DIAGNOSIS — M5136 Other intervertebral disc degeneration, lumbar region: Secondary | ICD-10-CM | POA: Diagnosis not present

## 2019-02-05 DIAGNOSIS — M5116 Intervertebral disc disorders with radiculopathy, lumbar region: Secondary | ICD-10-CM | POA: Diagnosis not present

## 2019-02-05 DIAGNOSIS — Z79891 Long term (current) use of opiate analgesic: Secondary | ICD-10-CM | POA: Diagnosis not present

## 2019-02-05 DIAGNOSIS — G4700 Insomnia, unspecified: Secondary | ICD-10-CM | POA: Insufficient documentation

## 2019-02-05 DIAGNOSIS — G8929 Other chronic pain: Secondary | ICD-10-CM | POA: Diagnosis not present

## 2019-02-05 DIAGNOSIS — M5136 Other intervertebral disc degeneration, lumbar region: Secondary | ICD-10-CM | POA: Diagnosis not present

## 2019-02-05 HISTORY — DX: Insomnia, unspecified: G47.00

## 2019-02-06 DIAGNOSIS — M5136 Other intervertebral disc degeneration, lumbar region: Secondary | ICD-10-CM | POA: Diagnosis not present

## 2019-02-06 DIAGNOSIS — M545 Low back pain: Secondary | ICD-10-CM | POA: Diagnosis not present

## 2019-02-06 DIAGNOSIS — M5416 Radiculopathy, lumbar region: Secondary | ICD-10-CM | POA: Diagnosis not present

## 2019-02-11 DIAGNOSIS — E119 Type 2 diabetes mellitus without complications: Secondary | ICD-10-CM | POA: Diagnosis not present

## 2019-02-11 DIAGNOSIS — Z01818 Encounter for other preprocedural examination: Secondary | ICD-10-CM | POA: Diagnosis not present

## 2019-02-11 DIAGNOSIS — H268 Other specified cataract: Secondary | ICD-10-CM | POA: Diagnosis not present

## 2019-02-11 DIAGNOSIS — H2522 Age-related cataract, morgagnian type, left eye: Secondary | ICD-10-CM | POA: Diagnosis not present

## 2019-02-13 DIAGNOSIS — H25812 Combined forms of age-related cataract, left eye: Secondary | ICD-10-CM | POA: Diagnosis not present

## 2019-02-13 DIAGNOSIS — H268 Other specified cataract: Secondary | ICD-10-CM | POA: Diagnosis not present

## 2019-02-25 ENCOUNTER — Other Ambulatory Visit: Payer: Self-pay

## 2019-02-25 DIAGNOSIS — M5416 Radiculopathy, lumbar region: Secondary | ICD-10-CM | POA: Diagnosis not present

## 2019-02-25 DIAGNOSIS — M5136 Other intervertebral disc degeneration, lumbar region: Secondary | ICD-10-CM | POA: Diagnosis not present

## 2019-02-25 DIAGNOSIS — M545 Low back pain: Secondary | ICD-10-CM | POA: Diagnosis not present

## 2019-02-25 NOTE — Patient Outreach (Signed)
[facility] [facility]) Care Management  02/25/2019  Jennifer Lucero 11-Aug-1950 [id number]   Medication Adherence call to Jennifer Lucero Telephone call to Patient regarding Medication Adherence unable to reach patient. Jennifer Lucero is showing past due on Lisinopril 40 mg under [place].  [place] Management Direct Dial [phone]  Fax [phone] [email or web address]

## 2019-02-27 DIAGNOSIS — M545 Low back pain: Secondary | ICD-10-CM | POA: Diagnosis not present

## 2019-02-27 DIAGNOSIS — M5136 Other intervertebral disc degeneration, lumbar region: Secondary | ICD-10-CM | POA: Diagnosis not present

## 2019-02-27 DIAGNOSIS — M5416 Radiculopathy, lumbar region: Secondary | ICD-10-CM | POA: Diagnosis not present

## 2019-03-04 DIAGNOSIS — M545 Low back pain: Secondary | ICD-10-CM | POA: Diagnosis not present

## 2019-03-04 DIAGNOSIS — M5136 Other intervertebral disc degeneration, lumbar region: Secondary | ICD-10-CM | POA: Diagnosis not present

## 2019-03-04 DIAGNOSIS — M5416 Radiculopathy, lumbar region: Secondary | ICD-10-CM | POA: Diagnosis not present

## 2019-03-06 DIAGNOSIS — M5416 Radiculopathy, lumbar region: Secondary | ICD-10-CM | POA: Diagnosis not present

## 2019-03-06 DIAGNOSIS — M5136 Other intervertebral disc degeneration, lumbar region: Secondary | ICD-10-CM | POA: Diagnosis not present

## 2019-03-06 DIAGNOSIS — M545 Low back pain: Secondary | ICD-10-CM | POA: Diagnosis not present

## 2019-03-14 DIAGNOSIS — M5416 Radiculopathy, lumbar region: Secondary | ICD-10-CM | POA: Diagnosis not present

## 2019-03-14 DIAGNOSIS — M545 Low back pain: Secondary | ICD-10-CM | POA: Diagnosis not present

## 2019-03-14 DIAGNOSIS — M5136 Other intervertebral disc degeneration, lumbar region: Secondary | ICD-10-CM | POA: Diagnosis not present

## 2019-03-18 DIAGNOSIS — B349 Viral infection, unspecified: Secondary | ICD-10-CM | POA: Diagnosis not present

## 2019-03-18 DIAGNOSIS — J069 Acute upper respiratory infection, unspecified: Secondary | ICD-10-CM | POA: Diagnosis not present

## 2019-03-26 ENCOUNTER — Other Ambulatory Visit: Payer: Self-pay | Admitting: Cardiology

## 2019-04-01 DIAGNOSIS — Z794 Long term (current) use of insulin: Secondary | ICD-10-CM | POA: Diagnosis not present

## 2019-04-01 DIAGNOSIS — H04123 Dry eye syndrome of bilateral lacrimal glands: Secondary | ICD-10-CM | POA: Diagnosis not present

## 2019-04-01 DIAGNOSIS — K219 Gastro-esophageal reflux disease without esophagitis: Secondary | ICD-10-CM | POA: Diagnosis not present

## 2019-04-01 DIAGNOSIS — E113299 Type 2 diabetes mellitus with mild nonproliferative diabetic retinopathy without macular edema, unspecified eye: Secondary | ICD-10-CM | POA: Diagnosis not present

## 2019-04-01 DIAGNOSIS — I10 Essential (primary) hypertension: Secondary | ICD-10-CM | POA: Diagnosis not present

## 2019-04-01 DIAGNOSIS — Z8673 Personal history of transient ischemic attack (TIA), and cerebral infarction without residual deficits: Secondary | ICD-10-CM | POA: Diagnosis not present

## 2019-04-01 DIAGNOSIS — E1136 Type 2 diabetes mellitus with diabetic cataract: Secondary | ICD-10-CM | POA: Diagnosis not present

## 2019-04-01 DIAGNOSIS — H25811 Combined forms of age-related cataract, right eye: Secondary | ICD-10-CM | POA: Diagnosis not present

## 2019-04-01 DIAGNOSIS — Z86718 Personal history of other venous thrombosis and embolism: Secondary | ICD-10-CM | POA: Diagnosis not present

## 2019-04-01 DIAGNOSIS — H259 Unspecified age-related cataract: Secondary | ICD-10-CM | POA: Diagnosis not present

## 2019-04-16 DIAGNOSIS — Z79899 Other long term (current) drug therapy: Secondary | ICD-10-CM | POA: Diagnosis not present

## 2019-04-16 DIAGNOSIS — Z139 Encounter for screening, unspecified: Secondary | ICD-10-CM | POA: Diagnosis not present

## 2019-04-16 DIAGNOSIS — Z9181 History of falling: Secondary | ICD-10-CM | POA: Diagnosis not present

## 2019-04-16 DIAGNOSIS — F419 Anxiety disorder, unspecified: Secondary | ICD-10-CM | POA: Diagnosis not present

## 2019-04-16 DIAGNOSIS — E785 Hyperlipidemia, unspecified: Secondary | ICD-10-CM | POA: Diagnosis not present

## 2019-04-16 DIAGNOSIS — Z1331 Encounter for screening for depression: Secondary | ICD-10-CM | POA: Diagnosis not present

## 2019-04-16 DIAGNOSIS — I10 Essential (primary) hypertension: Secondary | ICD-10-CM | POA: Diagnosis not present

## 2019-04-16 DIAGNOSIS — D649 Anemia, unspecified: Secondary | ICD-10-CM | POA: Diagnosis not present

## 2019-04-16 DIAGNOSIS — E118 Type 2 diabetes mellitus with unspecified complications: Secondary | ICD-10-CM | POA: Diagnosis not present

## 2019-04-21 DIAGNOSIS — Z6841 Body Mass Index (BMI) 40.0 and over, adult: Secondary | ICD-10-CM | POA: Diagnosis not present

## 2019-04-21 DIAGNOSIS — M7061 Trochanteric bursitis, right hip: Secondary | ICD-10-CM | POA: Diagnosis not present

## 2019-06-12 ENCOUNTER — Other Ambulatory Visit: Payer: Self-pay

## 2019-06-13 ENCOUNTER — Ambulatory Visit (INDEPENDENT_AMBULATORY_CARE_PROVIDER_SITE_OTHER): Payer: Medicare Other | Admitting: Cardiology

## 2019-06-13 ENCOUNTER — Other Ambulatory Visit: Payer: Self-pay

## 2019-06-13 ENCOUNTER — Encounter: Payer: Self-pay | Admitting: Cardiology

## 2019-06-13 VITALS — BP 160/98 | HR 95 | Ht 64.0 in | Wt 228.0 lb

## 2019-06-13 DIAGNOSIS — Z7901 Long term (current) use of anticoagulants: Secondary | ICD-10-CM | POA: Diagnosis not present

## 2019-06-13 DIAGNOSIS — Z86711 Personal history of pulmonary embolism: Secondary | ICD-10-CM

## 2019-06-13 DIAGNOSIS — I10 Essential (primary) hypertension: Secondary | ICD-10-CM

## 2019-06-13 MED ORDER — METOPROLOL SUCCINATE ER 50 MG PO TB24: 50.0000 mg | ORAL_TABLET | Freq: Every day | ORAL | 3 refills | Status: DC

## 2019-06-13 NOTE — Progress Notes (Signed)
Cardiology Office Note:    Date:  06/13/2019   ID:  Jennifer Lucero, DOB October 19, 1950, MRN [id number]  PCP:  Ocie Doyne., MD (Inactive)  Cardiologist:  Jenean Lindau, MD   Referring MD: No ref. provider found    ASSESSMENT:    1. Essential hypertension   2. Chronic anticoagulation   3. History of pulmonary embolus (PE)    PLAN:    In order of problems listed above:  1. I discussed my findings with the patient at length and primary prevention stressed.  Importance of compliance with diet and medication stressed and she vocalized understanding. 2. Essential hypertension: Blood pressure is elevated and also her heart rate is on the elevated side.  EKG done today reveals sinus tachycardia nonspecific ST-T changes.  Her heart rate is close to 100.  It was more when she came into the office.  With that in mind I have increased her metoprolol to 50 mg daily and she will keep track of her pulse and blood pressure. 3. Morbid obesity: Diet was emphasized weight reduction was stressed and risks of obesity explained to the patient. 4. History of pulmonary embolism: Patient on anticoagulation and risks revisited.  Questions were answered to her satisfaction. 5. Mixed dyslipidemia and diabetes mellitus: Managed by primary care physician.  Diet was emphasized.  Weight reduction was stressed.Patient will be seen in follow-up appointment in 6 months or earlier if the patient has any concerns    Medication Adjustments/Labs and Tests Ordered: Current medicines are reviewed at length with the patient today.  Concerns regarding medicines are outlined above.  No orders of the defined types were placed in this encounter.  No orders of the defined types were placed in this encounter.    Chief Complaint  Patient presents with  . Follow-up     History of Present Illness:    Jennifer Lucero is a 69 y.o. female with past medical history of essential hypertension diabetes mellitus dyslipidemia  obesity and atrial fibrillation.  She denies any problems at this time and takes care of activities of daily living.  She leads a sedentary lifestyle because of morbid obesity and orthopedic issues.  At the time of my evaluation, the patient is alert awake oriented and in no distress.  Past Medical History:  Diagnosis Date  . Anemia   . Angina pectoris ([place]) 10/24/2018  . Anxiety 06/15/2017  . Asthma 06/15/2017  . Chest pain 06/15/2017  . Chronic anticoagulation 07/09/2014  . DDD (degenerative disc disease), lumbar 01/13/2019   Last Assessment & Plan:  Formatting of this note might be different from the original. 69 year old female with chronic low back and right-sided L2-4 radicular symptoms.  Recently updated MRI shows multilevel degeneration, level of greatest pathology at L3-4 where there is moderate broad-based disc osteophyte complex indents the thecal sac and combines with mild facet and ligamentum flavum hypertro  . Diabetic neuropathy ([place]) 06/15/2017  . Esophagitis, reflux 06/15/2017  . Essential hypertension 10/24/2018  . History of pulmonary embolus (PE) 11/01/2017  . Hyperlipidemia 06/15/2017  . Insomnia 02/05/2019   Last Assessment & Plan:  Formatting of this note might be different from the original. Will trial trazodone 50-100 mg q.h.s..  Try 50 for 1 week and increase to 100 if no response, as tolerated.  . Knee pain   . Lumbago   . Lumbar radiculopathy 01/13/2019   Last Assessment & Plan:  Formatting of this note might be different from the original. Discontinue gabapentin due  to drowsiness and ataxia.  Continue with physical therapy as directed.  . Osteoporosis 06/15/2017  . Pulmonary embolism ([place]) 06/15/2017  . Stroke [facility]) 06/15/2017    Past Surgical History:  Procedure Laterality Date  . HERNIA REPAIR    . IR RADIOLOGIST EVAL & MGMT  11/06/2017  . LEFT HEART CATH AND CORONARY ANGIOGRAPHY N/A 11/01/2018   Procedure: LEFT HEART CATH AND CORONARY ANGIOGRAPHY;  Surgeon: Belva Crome, MD;  Location: [place] [facility];  Service: Cardiovascular;  Laterality: N/A;    Current Medications: Current Meds  Medication Sig  . albuterol (PROVENTIL HFA;VENTOLIN HFA) 108 (90 Base) MCG/ACT inhaler Inhale 2 puffs into the lungs every 4 (four) hours as needed for wheezing or shortness of breath.   . ALPRAZolam (XANAX) 0.5 MG tablet Take 0.25 mg by mouth 2 (two) times daily.   [name] Kitchen apixaban (ELIQUIS) 5 MG TABS tablet Take 5 mg by mouth 2 (two) times daily.   [name] Kitchen aspirin EC 81 MG tablet Take 1 tablet by mouth daily.  . Betamethasone Sodium Phosphate 6 MG/ML SOLN Inject 2.4 mg as directed. Into right hip  . esomeprazole (NEXIUM) 40 MG capsule Take 1 capsule by mouth daily.   . Fe Fum-FA-B Cmp-C-Zn-Mg-Mn-Cu (FERROCITE PLUS) 106-1 MG TABS Take 1 mg by mouth 2 (two) times daily.  . fluticasone (FLONASE) 50 MCG/ACT nasal spray Place 1 spray into both nostrils daily.  [name] Kitchen gabapentin (NEURONTIN) 300 MG capsule Take 1 tab PO QHS x 3 days, then 1 tab qAM and qPM x 3 days, then 1 tab TID thereafter as tolerated.  [name] Kitchen gentamicin (GARAMYCIN) 0.3 % ophthalmic solution Place 1 drop into the right eye 4 (four) times daily.  [name] Kitchen glimepiride (AMARYL) 4 MG tablet Take 4 mg by mouth daily.   [name] Kitchen ibuprofen (ADVIL) 200 MG tablet Take 400 mg by mouth every 6 (six) hours as needed for mild pain.  . Insulin Detemir (LEVEMIR FLEXTOUCH) 100 UNIT/ML Pen Inject 52 Units into the skin daily.   . isosorbide mononitrate (IMDUR) 120 MG 24 hr tablet Take 1 tablet (120 mg total) by mouth daily.  [name] Kitchen lisinopril (PRINIVIL,ZESTRIL) 40 MG tablet Take 40 mg by mouth daily.   [name] Kitchen loratadine (CLARITIN) 10 MG tablet Take 1 tablet by mouth daily as needed for allergies.   . metFORMIN (GLUCOPHAGE) 500 MG tablet Take 500 mg by mouth 2 (two) times daily with a meal.   . metoprolol succinate (TOPROL-XL) 25 MG 24 hr tablet Take 25 mg by mouth at bedtime.  . nitroGLYCERIN (NITROSTAT) 0.4 MG SL tablet Place 1 tablet under the tongue every 5  (five) minutes as needed for chest pain.   [name] Kitchen omeprazole (PRILOSEC) 20 MG capsule Take 20 mg by mouth daily.  [name] Kitchen oxybutynin (DITROPAN) 5 MG tablet   . pravastatin (PRAVACHOL) 10 MG tablet Take 10 mg by mouth daily.  . prednisoLONE acetate (PRED FORTE) 1 % ophthalmic suspension PLACE ONE DROP IN THE LEFT EYE FOUR TIMES DAILY FOR 7 DAYS, THREE TIMES DAILY FOR 7 DAYS, TWICE DAILY FOR 7 DAYS, ONCE DAILY FOR 7 DAYS  . tiZANidine (ZANAFLEX) 4 MG tablet Take 2-4 mg by mouth 3 (three) times daily as needed.  . traMADol (ULTRAM) 50 MG tablet Take 1 tablet by mouth daily as needed for moderate pain.   [name] Kitchen triamterene-hydrochlorothiazide (MAXZIDE-25) 37.5-25 MG tablet Take 1-2 tablets by mouth daily.  . Vitamin D, Ergocalciferol, (DRISDOL) 50000 units CAPS capsule Take 50,000 Units by mouth every Sunday.  Allergies:   Buspirone, Ciprofloxacin, Ciprofloxacin-fluocinolone pf, and Codeine   Social History   Socioeconomic History  . Marital status: Married    Spouse name: Not on file  . Number of children: Not on file  . Years of education: Not on file  . Highest education level: Not on file  Occupational History  . Not on file  Tobacco Use  . Smoking status: Never Smoker  . Smokeless tobacco: Never Used  Substance and Sexual Activity  . Alcohol use: Not Currently  . Drug use: Not Currently  . Sexual activity: Not on file  Other Topics Concern  . Not on file  Social History Narrative  . Not on file   Social Determinants of Health   Financial Resource Strain:   . Difficulty of Paying Living Expenses:   Food Insecurity:   . Worried About Charity fundraiser in the Last Year:   . Arboriculturist in the Last Year:   Transportation Needs:   . Film/video editor (Medical):   [name] Kitchen Lack of Transportation (Non-Medical):   Physical Activity:   . Days of Exercise per Week:   . Minutes of Exercise per Session:   Stress:   . Feeling of Stress :   Social Connections:   . Frequency of  Communication with Friends and Family:   . Frequency of Social Gatherings with Friends and Family:   . Attends Religious Services:   . Active Member of Clubs or Organizations:   . Attends Archivist Meetings:   [name] Kitchen Marital Status:      Family History: The patient's family history includes Diabetes in her brother; Heart disease in her mother.  ROS:   Please see the history of present illness.    All other systems reviewed and are negative.  EKGs/Labs/Other Studies Reviewed:    The following studies were reviewed today: Belva Crome, MD (Primary)    Procedures  LEFT HEART CATH AND CORONARY ANGIOGRAPHY  Conclusion   Normal coronary arteries.  Normal left ventricle with EF 55%.  LVEDP is normal.  No coronary calcification noted on cine fluoroscopy.  RECOMMENDATIONS:   Further management per Dr. Geraldo Pitter      Recent Labs: 10/24/2018: BUN 20; Creatinine, Ser 1.11; Hemoglobin 13.2; Platelets 450; Potassium 4.3; Sodium 144  Recent Lipid Panel No results found for: CHOL, TRIG, HDL, CHOLHDL, VLDL, LDLCALC, LDLDIRECT  Physical Exam:    VS:  BP (!) 160/98 (BP Location: Right Arm, Patient Position: Sitting, Cuff Size: Large)   Pulse 95   Ht 5\' 4"  (1.626 m)   Wt 228 lb (103.4 kg)   SpO2 95%   BMI 39.14 kg/m     Wt Readings from Last 3 Encounters:  06/13/19 228 lb (103.4 kg)  11/29/18 223 lb (101.2 kg)  11/01/18 220 lb (99.8 kg)     GEN: Patient is in no acute distress HEENT: Normal NECK: No JVD; No carotid bruits LYMPHATICS: No lymphadenopathy CARDIAC: Hear sounds regular, 2/6 systolic murmur at the apex. RESPIRATORY:  Clear to auscultation without rales, wheezing or rhonchi  ABDOMEN: Soft, non-tender, non-distended MUSCULOSKELETAL:  No edema; No deformity  SKIN: Warm and dry NEUROLOGIC:  Alert and oriented x 3 PSYCHIATRIC:  Normal affect   Signed, Jenean Lindau, MD  06/13/2019 4:18 PM     [facility]

## 2019-06-13 NOTE — Patient Instructions (Signed)
Medication Instructions:  Your physician has recommended you make the following change in your medication: increase your metoprolol to 50 mg daily.  *If you need a refill on your cardiac medications before your next appointment, please call your pharmacy*   Lab Work: None ordered If you have labs (blood work) drawn today and your tests are completely normal, you will receive your results only by: [name] Kitchen MyChart Message (if you have MyChart) OR . A paper copy in the mail If you have any lab test that is abnormal or we need to change your treatment, we will call you to review the results.   Testing/Procedures: None ordered   Follow-Up: At [facility], you and your health needs are our priority.  As part of our continuing mission to provide you with exceptional heart care, we have created designated Provider Care Teams.  These Care Teams include your primary Cardiologist (physician) and Advanced Practice Providers (APPs -  Physician Assistants and Nurse Practitioners) who all work together to provide you with the care you need, when you need it.  We recommend signing up for the patient portal called "MyChart".  Sign up information is provided on this After Visit Summary.  MyChart is used to connect with patients for Virtual Visits (Telemedicine).  Patients are able to view lab/test results, encounter notes, upcoming appointments, etc.  Non-urgent messages can be sent to your provider as well.   To learn more about what you can do with MyChart, go to [email or web address].    Your next appointment:   6 month(s)  The format for your next appointment:   In Person  Provider:   Jyl Heinz, MD   Other Instructions NA

## 2019-08-14 DIAGNOSIS — E118 Type 2 diabetes mellitus with unspecified complications: Secondary | ICD-10-CM | POA: Diagnosis not present

## 2019-08-14 DIAGNOSIS — J301 Allergic rhinitis due to pollen: Secondary | ICD-10-CM | POA: Diagnosis not present

## 2019-08-14 DIAGNOSIS — E785 Hyperlipidemia, unspecified: Secondary | ICD-10-CM | POA: Diagnosis not present

## 2019-08-14 DIAGNOSIS — E559 Vitamin D deficiency, unspecified: Secondary | ICD-10-CM | POA: Diagnosis not present

## 2019-08-14 DIAGNOSIS — I2699 Other pulmonary embolism without acute cor pulmonale: Secondary | ICD-10-CM | POA: Diagnosis not present

## 2019-08-14 DIAGNOSIS — D649 Anemia, unspecified: Secondary | ICD-10-CM | POA: Diagnosis not present

## 2019-08-14 DIAGNOSIS — Z79899 Other long term (current) drug therapy: Secondary | ICD-10-CM | POA: Diagnosis not present

## 2019-08-22 DIAGNOSIS — I10 Essential (primary) hypertension: Secondary | ICD-10-CM | POA: Diagnosis not present

## 2019-09-12 DIAGNOSIS — S41119A Laceration without foreign body of unspecified upper arm, initial encounter: Secondary | ICD-10-CM | POA: Diagnosis not present

## 2019-09-12 DIAGNOSIS — B354 Tinea corporis: Secondary | ICD-10-CM | POA: Diagnosis not present

## 2019-09-12 DIAGNOSIS — Z23 Encounter for immunization: Secondary | ICD-10-CM | POA: Diagnosis not present

## 2019-09-22 DIAGNOSIS — S41111A Laceration without foreign body of right upper arm, initial encounter: Secondary | ICD-10-CM | POA: Diagnosis not present

## 2019-09-23 ENCOUNTER — Other Ambulatory Visit: Payer: Self-pay | Admitting: Cardiology

## 2019-10-15 DIAGNOSIS — S41111S Laceration without foreign body of right upper arm, sequela: Secondary | ICD-10-CM | POA: Diagnosis not present

## 2019-10-15 DIAGNOSIS — R8290 Unspecified abnormal findings in urine: Secondary | ICD-10-CM | POA: Diagnosis not present

## 2019-10-15 DIAGNOSIS — R1011 Right upper quadrant pain: Secondary | ICD-10-CM | POA: Diagnosis not present

## 2019-10-15 DIAGNOSIS — R300 Dysuria: Secondary | ICD-10-CM | POA: Diagnosis not present

## 2019-11-21 DIAGNOSIS — D649 Anemia, unspecified: Secondary | ICD-10-CM | POA: Diagnosis not present

## 2019-11-21 DIAGNOSIS — E785 Hyperlipidemia, unspecified: Secondary | ICD-10-CM | POA: Diagnosis not present

## 2019-11-21 DIAGNOSIS — E118 Type 2 diabetes mellitus with unspecified complications: Secondary | ICD-10-CM | POA: Diagnosis not present

## 2020-03-26 ENCOUNTER — Other Ambulatory Visit: Payer: Self-pay | Admitting: Cardiology

## 2020-03-26 NOTE — Telephone Encounter (Signed)
Refill for Imdur 120 mg # 30 only , message to patient with refill to keep scheduled appointment 04/23/20 for future refills

## 2020-04-07 ENCOUNTER — Encounter: Payer: Self-pay | Admitting: Gastroenterology

## 2020-04-22 ENCOUNTER — Other Ambulatory Visit: Payer: Self-pay

## 2020-04-22 DIAGNOSIS — M545 Low back pain, unspecified: Secondary | ICD-10-CM | POA: Insufficient documentation

## 2020-04-22 DIAGNOSIS — M25569 Pain in unspecified knee: Secondary | ICD-10-CM | POA: Insufficient documentation

## 2020-04-22 DIAGNOSIS — E559 Vitamin D deficiency, unspecified: Secondary | ICD-10-CM | POA: Insufficient documentation

## 2020-04-22 DIAGNOSIS — K219 Gastro-esophageal reflux disease without esophagitis: Secondary | ICD-10-CM | POA: Insufficient documentation

## 2020-04-22 DIAGNOSIS — E119 Type 2 diabetes mellitus without complications: Secondary | ICD-10-CM | POA: Insufficient documentation

## 2020-04-22 DIAGNOSIS — D649 Anemia, unspecified: Secondary | ICD-10-CM | POA: Insufficient documentation

## 2020-04-22 DIAGNOSIS — D735 Infarction of spleen: Secondary | ICD-10-CM | POA: Insufficient documentation

## 2020-04-22 DIAGNOSIS — I4891 Unspecified atrial fibrillation: Secondary | ICD-10-CM | POA: Insufficient documentation

## 2020-04-23 ENCOUNTER — Ambulatory Visit: Payer: Medicare Other | Admitting: Cardiology

## 2020-05-12 ENCOUNTER — Other Ambulatory Visit: Payer: Self-pay

## 2020-05-12 ENCOUNTER — Encounter: Payer: Self-pay | Admitting: Gastroenterology

## 2020-05-12 ENCOUNTER — Ambulatory Visit (INDEPENDENT_AMBULATORY_CARE_PROVIDER_SITE_OTHER): Payer: Medicare Other | Admitting: Gastroenterology

## 2020-05-12 ENCOUNTER — Telehealth: Payer: Self-pay

## 2020-05-12 VITALS — BP 132/80 | HR 92 | Ht 64.0 in | Wt 218.0 lb

## 2020-05-12 DIAGNOSIS — K625 Hemorrhage of anus and rectum: Secondary | ICD-10-CM

## 2020-05-12 DIAGNOSIS — R1013 Epigastric pain: Secondary | ICD-10-CM | POA: Diagnosis not present

## 2020-05-12 DIAGNOSIS — D509 Iron deficiency anemia, unspecified: Secondary | ICD-10-CM

## 2020-05-12 MED ORDER — NA SULFATE-K SULFATE-MG SULF 17.5-3.13-1.6 GM/177ML PO SOLN: 1.0000 | Freq: Once | ORAL | 0 refills | Status: AC
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
## 2020-05-12 NOTE — Telephone Encounter (Signed)
Clinical pharmacist to review Eliquis 

## 2020-05-12 NOTE — Patient Instructions (Addendum)
If you are age 72 or older, your body mass index should be between 23-30. Your Body mass index is 37.42 kg/m. If this is out of the aforementioned range listed, please consider follow up with your Primary Care Provider.  If you are age 88 or younger, your body mass index should be between 19-25. Your Body mass index is 37.42 kg/m. If this is out of the aformentioned range listed, please consider follow up with your Primary Care Provider.   You will be contacted by our office prior to your procedure for directions on holding your eliquis.  If you do not hear from our office 1 week prior to your scheduled procedure, please call [phone] to discuss.   You have been scheduled for an endoscopy and colonoscopy. Please follow the written instructions given to you at your visit today. Please pick up your prep supplies at the pharmacy within the next 1-3 days. If you use inhalers (even only as needed), please bring them with you on the day of your procedure.  You will have labs done as well the day of your procedure. Please come about 30 minutes earlier prior to the arrival procedure and head to the basement level of the building.  Thank you,  Dr. Jackquline Denmark

## 2020-05-12 NOTE — Telephone Encounter (Signed)
Patient with diagnosis of atrial fibrillation on Eliquis for anticoagulation.    Procedure: EGD/colonoscopy Date of procedure: 06/25/2020   CHA2DS2-VASc Score = 6  This indicates a 9.7% annual risk of stroke. The patient's score is based upon: CHF History: No HTN History: Yes Diabetes History: Yes Stroke History: Yes Vascular Disease History: No Age Score: 1 Gender Score: 1  Note that OV by Dr Geraldo Pitter (10/2017) indicates patient with hx of CVA and "multiple" PE.  No indication of dates of these events.  Will forward to Dr. Geraldo Pitter to determine if he would like to have patient take enoxaparin on day prior to procedure (while holding Eliquis)  CrCl 50.3 Platelet count 399  .

## 2020-05-12 NOTE — Telephone Encounter (Signed)
[facility] Pre-operative Risk Assessment     Request for surgical clearance:     Endoscopy Procedure  What type of surgery is being performed?     06/25/2020  When is this surgery scheduled?     EGD/Colon  What type of clearance is required ?   Pharmacy  Are there any medications that need to be held prior to surgery and how long? Eliquis 24 hours  Practice name and name of physician performing surgery?      [facility]  What is your office phone and fax number?      Phone- [phone]  [phone]  Anesthesia type (None, local, MAC, general) ?       MAC

## 2020-05-12 NOTE — Telephone Encounter (Signed)
Awaiting recommendation by MD

## 2020-05-17 ENCOUNTER — Other Ambulatory Visit: Payer: Self-pay | Admitting: Cardiology

## 2020-05-25 NOTE — Telephone Encounter (Signed)
   Name: Jennifer Lucero  DOB: [birth year]  MRN: [id number]   Primary Cardiologist: Jenean Lindau, MD  Chart reviewed as part of pre-operative protocol coverage.  We have been asked for guidance to hold anticoagulation. Pt is on [facility] for hx of PE, which is managed by her PCP. Please reach out to PCP for [facility] recommendations and need for lovenox bridge.    I will route this recommendation to the requesting party via Epic fax function and remove from pre-op pool. Please call with questions.  [place], PA 05/25/2020, 8:32 AM

## 2020-06-22 ENCOUNTER — Other Ambulatory Visit: Payer: Self-pay | Admitting: Cardiology

## 2020-06-22 NOTE — Telephone Encounter (Signed)
Refill sent to pharmacy.   Needs appointment for further refills.  

## 2020-06-25 ENCOUNTER — Encounter: Payer: Medicare Other | Admitting: Gastroenterology

## 2020-07-02 DIAGNOSIS — C4490 Unspecified malignant neoplasm of skin, unspecified: Secondary | ICD-10-CM

## 2020-07-02 DIAGNOSIS — L03115 Cellulitis of right lower limb: Secondary | ICD-10-CM

## 2020-07-02 DIAGNOSIS — Z8673 Personal history of transient ischemic attack (TIA), and cerebral infarction without residual deficits: Secondary | ICD-10-CM

## 2020-07-02 DIAGNOSIS — I10 Essential (primary) hypertension: Secondary | ICD-10-CM

## 2020-07-02 DIAGNOSIS — E119 Type 2 diabetes mellitus without complications: Secondary | ICD-10-CM

## 2020-07-02 DIAGNOSIS — N179 Acute kidney failure, unspecified: Secondary | ICD-10-CM

## 2020-07-02 DIAGNOSIS — Z89219 Acquired absence of unspecified upper limb below elbow: Secondary | ICD-10-CM

## 2020-07-02 DIAGNOSIS — N183 Chronic kidney disease, stage 3 unspecified: Secondary | ICD-10-CM

## 2020-07-02 HISTORY — DX: Unspecified malignant neoplasm of skin, unspecified: C44.90

## 2020-07-02 HISTORY — DX: Type 2 diabetes mellitus without complications: E11.9

## 2020-07-02 HISTORY — DX: Chronic kidney disease, unspecified: N17.9

## 2020-07-02 HISTORY — DX: Personal history of transient ischemic attack (TIA), and cerebral infarction without residual deficits: Z86.73

## 2020-07-02 HISTORY — DX: Cellulitis of right lower limb: L03.115

## 2020-07-02 HISTORY — DX: Essential (primary) hypertension: I10

## 2020-07-02 HISTORY — DX: Chronic kidney disease, stage 3 unspecified: N18.30

## 2020-07-02 HISTORY — DX: Acquired absence of unspecified upper limb below elbow: Z89.219

## 2020-07-22 ENCOUNTER — Other Ambulatory Visit: Payer: Self-pay | Admitting: Cardiology

## 2020-07-23 ENCOUNTER — Other Ambulatory Visit: Payer: Self-pay

## 2020-07-23 MED ORDER — ISOSORBIDE MONONITRATE ER 120 MG PO TB24: 120.0000 mg | ORAL_TABLET | Freq: Every day | ORAL | 0 refills | Status: DC

## 2020-07-28 ENCOUNTER — Inpatient Hospital Stay (HOSPITAL_COMMUNITY): Payer: Medicare Other | Admitting: Certified Registered Nurse Anesthetist

## 2020-07-28 ENCOUNTER — Encounter (HOSPITAL_COMMUNITY)
Admission: EM | Disposition: A | Discharge status: Rehabilitation Facility | Payer: Self-pay | Source: Home / Self Care | Attending: Neurology

## 2020-07-28 ENCOUNTER — Inpatient Hospital Stay: Admit: 2020-07-28 | Payer: Medicare Other

## 2020-07-28 ENCOUNTER — Telehealth (HOSPITAL_COMMUNITY): Payer: Self-pay | Admitting: *Deleted

## 2020-07-28 ENCOUNTER — Inpatient Hospital Stay (HOSPITAL_COMMUNITY): Payer: Medicare Other

## 2020-07-28 ENCOUNTER — Inpatient Hospital Stay (HOSPITAL_COMMUNITY)
Admission: EM | Admit: 2020-07-28 | Discharge: 2020-08-05 | DRG: 023 | Disposition: A | Discharge status: Rehabilitation Facility | Payer: Medicare Other | Attending: Neurology | Admitting: Neurology

## 2020-07-28 ENCOUNTER — Emergency Department (HOSPITAL_COMMUNITY): Payer: Medicare Other

## 2020-07-28 DIAGNOSIS — Z888 Allergy status to other drugs, medicaments and biological substances status: Secondary | ICD-10-CM

## 2020-07-28 DIAGNOSIS — I69322 Dysarthria following cerebral infarction: Secondary | ICD-10-CM | POA: Diagnosis not present

## 2020-07-28 DIAGNOSIS — E785 Hyperlipidemia, unspecified: Secondary | ICD-10-CM | POA: Diagnosis present

## 2020-07-28 DIAGNOSIS — R414 Neurologic neglect syndrome: Secondary | ICD-10-CM | POA: Diagnosis present

## 2020-07-28 DIAGNOSIS — E1140 Type 2 diabetes mellitus with diabetic neuropathy, unspecified: Secondary | ICD-10-CM | POA: Diagnosis present

## 2020-07-28 DIAGNOSIS — R29723 NIHSS score 23: Secondary | ICD-10-CM | POA: Diagnosis not present

## 2020-07-28 DIAGNOSIS — Z7901 Long term (current) use of anticoagulants: Secondary | ICD-10-CM

## 2020-07-28 DIAGNOSIS — R569 Unspecified convulsions: Secondary | ICD-10-CM | POA: Diagnosis present

## 2020-07-28 DIAGNOSIS — Z7984 Long term (current) use of oral hypoglycemic drugs: Secondary | ICD-10-CM | POA: Diagnosis not present

## 2020-07-28 DIAGNOSIS — G9349 Other encephalopathy: Secondary | ICD-10-CM | POA: Diagnosis not present

## 2020-07-28 DIAGNOSIS — J810 Acute pulmonary edema: Secondary | ICD-10-CM | POA: Diagnosis not present

## 2020-07-28 DIAGNOSIS — J45909 Unspecified asthma, uncomplicated: Secondary | ICD-10-CM | POA: Diagnosis present

## 2020-07-28 DIAGNOSIS — I69354 Hemiplegia and hemiparesis following cerebral infarction affecting left non-dominant side: Secondary | ICD-10-CM | POA: Diagnosis present

## 2020-07-28 DIAGNOSIS — J9602 Acute respiratory failure with hypercapnia: Secondary | ICD-10-CM | POA: Diagnosis not present

## 2020-07-28 DIAGNOSIS — R29730 NIHSS score 30: Secondary | ICD-10-CM | POA: Diagnosis not present

## 2020-07-28 DIAGNOSIS — I482 Chronic atrial fibrillation, unspecified: Secondary | ICD-10-CM | POA: Diagnosis not present

## 2020-07-28 DIAGNOSIS — I69320 Aphasia following cerebral infarction: Secondary | ICD-10-CM | POA: Diagnosis not present

## 2020-07-28 DIAGNOSIS — Z20822 Contact with and (suspected) exposure to covid-19: Secondary | ICD-10-CM | POA: Diagnosis present

## 2020-07-28 DIAGNOSIS — I4821 Permanent atrial fibrillation: Secondary | ICD-10-CM | POA: Diagnosis not present

## 2020-07-28 DIAGNOSIS — Z6837 Body mass index (BMI) 37.0-37.9, adult: Secondary | ICD-10-CM | POA: Diagnosis not present

## 2020-07-28 DIAGNOSIS — I69391 Dysphagia following cerebral infarction: Secondary | ICD-10-CM | POA: Diagnosis not present

## 2020-07-28 DIAGNOSIS — R1312 Dysphagia, oropharyngeal phase: Secondary | ICD-10-CM | POA: Diagnosis not present

## 2020-07-28 DIAGNOSIS — I63411 Cerebral infarction due to embolism of right middle cerebral artery: Principal | ICD-10-CM | POA: Diagnosis present

## 2020-07-28 DIAGNOSIS — Z885 Allergy status to narcotic agent status: Secondary | ICD-10-CM

## 2020-07-28 DIAGNOSIS — I63511 Cerebral infarction due to unspecified occlusion or stenosis of right middle cerebral artery: Secondary | ICD-10-CM | POA: Diagnosis not present

## 2020-07-28 DIAGNOSIS — R29727 NIHSS score 27: Secondary | ICD-10-CM | POA: Diagnosis not present

## 2020-07-28 DIAGNOSIS — E1122 Type 2 diabetes mellitus with diabetic chronic kidney disease: Secondary | ICD-10-CM | POA: Diagnosis present

## 2020-07-28 DIAGNOSIS — R32 Unspecified urinary incontinence: Secondary | ICD-10-CM | POA: Diagnosis present

## 2020-07-28 DIAGNOSIS — R4701 Aphasia: Secondary | ICD-10-CM | POA: Diagnosis present

## 2020-07-28 DIAGNOSIS — Z794 Long term (current) use of insulin: Secondary | ICD-10-CM | POA: Diagnosis not present

## 2020-07-28 DIAGNOSIS — F419 Anxiety disorder, unspecified: Secondary | ICD-10-CM | POA: Diagnosis present

## 2020-07-28 DIAGNOSIS — E1165 Type 2 diabetes mellitus with hyperglycemia: Secondary | ICD-10-CM | POA: Diagnosis present

## 2020-07-28 DIAGNOSIS — Z8673 Personal history of transient ischemic attack (TIA), and cerebral infarction without residual deficits: Secondary | ICD-10-CM | POA: Diagnosis not present

## 2020-07-28 DIAGNOSIS — R233 Spontaneous ecchymoses: Secondary | ICD-10-CM | POA: Diagnosis present

## 2020-07-28 DIAGNOSIS — I639 Cerebral infarction, unspecified: Secondary | ICD-10-CM

## 2020-07-28 DIAGNOSIS — I480 Paroxysmal atrial fibrillation: Secondary | ICD-10-CM | POA: Diagnosis present

## 2020-07-28 DIAGNOSIS — Z881 Allergy status to other antibiotic agents status: Secondary | ICD-10-CM | POA: Diagnosis not present

## 2020-07-28 DIAGNOSIS — M810 Age-related osteoporosis without current pathological fracture: Secondary | ICD-10-CM | POA: Diagnosis present

## 2020-07-28 DIAGNOSIS — D631 Anemia in chronic kidney disease: Secondary | ICD-10-CM | POA: Diagnosis present

## 2020-07-28 DIAGNOSIS — R1310 Dysphagia, unspecified: Secondary | ICD-10-CM | POA: Diagnosis present

## 2020-07-28 DIAGNOSIS — I10 Essential (primary) hypertension: Secondary | ICD-10-CM | POA: Diagnosis present

## 2020-07-28 DIAGNOSIS — Z86711 Personal history of pulmonary embolism: Secondary | ICD-10-CM

## 2020-07-28 DIAGNOSIS — G8194 Hemiplegia, unspecified affecting left nondominant side: Secondary | ICD-10-CM | POA: Diagnosis not present

## 2020-07-28 DIAGNOSIS — R29715 NIHSS score 15: Secondary | ICD-10-CM | POA: Diagnosis present

## 2020-07-28 DIAGNOSIS — M5116 Intervertebral disc disorders with radiculopathy, lumbar region: Secondary | ICD-10-CM | POA: Diagnosis present

## 2020-07-28 DIAGNOSIS — I129 Hypertensive chronic kidney disease with stage 1 through stage 4 chronic kidney disease, or unspecified chronic kidney disease: Secondary | ICD-10-CM | POA: Diagnosis present

## 2020-07-28 DIAGNOSIS — R451 Restlessness and agitation: Secondary | ICD-10-CM | POA: Diagnosis not present

## 2020-07-28 DIAGNOSIS — R29810 Facial weakness: Secondary | ICD-10-CM | POA: Diagnosis present

## 2020-07-28 DIAGNOSIS — G4700 Insomnia, unspecified: Secondary | ICD-10-CM | POA: Diagnosis present

## 2020-07-28 DIAGNOSIS — Z8249 Family history of ischemic heart disease and other diseases of the circulatory system: Secondary | ICD-10-CM

## 2020-07-28 DIAGNOSIS — R0603 Acute respiratory distress: Secondary | ICD-10-CM | POA: Diagnosis not present

## 2020-07-28 DIAGNOSIS — I4891 Unspecified atrial fibrillation: Secondary | ICD-10-CM | POA: Diagnosis present

## 2020-07-28 DIAGNOSIS — N1831 Chronic kidney disease, stage 3a: Secondary | ICD-10-CM | POA: Diagnosis present

## 2020-07-28 DIAGNOSIS — I6601 Occlusion and stenosis of right middle cerebral artery: Secondary | ICD-10-CM

## 2020-07-28 DIAGNOSIS — Z4659 Encounter for fitting and adjustment of other gastrointestinal appliance and device: Secondary | ICD-10-CM

## 2020-07-28 DIAGNOSIS — Z833 Family history of diabetes mellitus: Secondary | ICD-10-CM | POA: Diagnosis not present

## 2020-07-28 DIAGNOSIS — R195 Other fecal abnormalities: Secondary | ICD-10-CM | POA: Diagnosis not present

## 2020-07-28 DIAGNOSIS — G4733 Obstructive sleep apnea (adult) (pediatric): Secondary | ICD-10-CM | POA: Diagnosis present

## 2020-07-28 DIAGNOSIS — I69319 Unspecified symptoms and signs involving cognitive functions following cerebral infarction: Secondary | ICD-10-CM | POA: Diagnosis not present

## 2020-07-28 DIAGNOSIS — Z79899 Other long term (current) drug therapy: Secondary | ICD-10-CM

## 2020-07-28 DIAGNOSIS — J9601 Acute respiratory failure with hypoxia: Secondary | ICD-10-CM

## 2020-07-28 DIAGNOSIS — K219 Gastro-esophageal reflux disease without esophagitis: Secondary | ICD-10-CM | POA: Diagnosis present

## 2020-07-28 DIAGNOSIS — D72829 Elevated white blood cell count, unspecified: Secondary | ICD-10-CM

## 2020-07-28 HISTORY — PX: IR CT HEAD LTD: IMG2386

## 2020-07-28 HISTORY — PX: RADIOLOGY WITH ANESTHESIA: SHX6223

## 2020-07-28 HISTORY — PX: IR PERCUTANEOUS ART THROMBECTOMY/INFUSION INTRACRANIAL INC DIAG ANGIO: IMG6087

## 2020-07-28 HISTORY — DX: Cerebral infarction, unspecified: I63.9

## 2020-07-28 HISTORY — DX: Occlusion and stenosis of right middle cerebral artery: I66.01

## 2020-07-28 LAB — COMPREHENSIVE METABOLIC PANEL
ALT: 18 U/L (ref 0–44)
AST: 41 U/L (ref 15–41)
Albumin: 3.1 g/dL — ABNORMAL LOW (ref 3.5–5.0)
Alkaline Phosphatase: 38 U/L (ref 38–126)
Anion gap: 8 (ref 5–15)
BUN: 18 mg/dL (ref 8–23)
CO2: 19 mmol/L — ABNORMAL LOW (ref 22–32)
Calcium: 8.3 mg/dL — ABNORMAL LOW (ref 8.9–10.3)
Chloride: 113 mmol/L — ABNORMAL HIGH (ref 98–111)
Creatinine, Ser: 1.21 mg/dL — ABNORMAL HIGH (ref 0.44–1.00)
GFR, Estimated: 48 mL/min — ABNORMAL LOW (ref >60)
Glucose, Bld: 130 mg/dL — ABNORMAL HIGH (ref 70–99)
Potassium: 5.2 mmol/L — ABNORMAL HIGH (ref 3.5–5.1)
Sodium: 140 mmol/L (ref 135–145)
Total Bilirubin: 0.7 mg/dL (ref 0.3–1.2)
Total Protein: 6.1 g/dL — ABNORMAL LOW (ref 6.5–8.1)

## 2020-07-28 LAB — CBC
HCT: 34 % — ABNORMAL LOW (ref 36.0–46.0)
Hemoglobin: 11 g/dL — ABNORMAL LOW (ref 12.0–15.0)
MCH: 29.3 pg (ref 26.0–34.0)
MCHC: 32.4 g/dL (ref 30.0–36.0)
MCV: 90.7 fL (ref 80.0–100.0)
Platelets: 310 10*3/uL (ref 150–400)
RBC: 3.75 MIL/uL — ABNORMAL LOW (ref 3.87–5.11)
RDW: 16.1 % — ABNORMAL HIGH (ref 11.5–15.5)
WBC: 12.6 10*3/uL — ABNORMAL HIGH (ref 4.0–10.5)
nRBC: 0 % (ref 0.0–0.2)

## 2020-07-28 LAB — GLUCOSE, CAPILLARY
Glucose-Capillary: 135 mg/dL — ABNORMAL HIGH (ref 70–99)
Glucose-Capillary: 159 mg/dL — ABNORMAL HIGH (ref 70–99)
Glucose-Capillary: 203 mg/dL — ABNORMAL HIGH (ref 70–99)

## 2020-07-28 LAB — POCT I-STAT 7, (LYTES, BLD GAS, ICA,H+H)
Acid-base deficit: 6 mmol/L — ABNORMAL HIGH (ref 0.0–2.0)
Bicarbonate: 20.6 mmol/L (ref 20.0–28.0)
Calcium, Ion: 1.22 mmol/L (ref 1.15–1.40)
HCT: 28 % — ABNORMAL LOW (ref 36.0–46.0)
Hemoglobin: 9.5 g/dL — ABNORMAL LOW (ref 12.0–15.0)
O2 Saturation: 94 %
Potassium: 4.1 mmol/L (ref 3.5–5.1)
Sodium: 144 mmol/L (ref 135–145)
TCO2: 22 mmol/L (ref 22–32)
pCO2 arterial: 46.7 mmHg (ref 32.0–48.0)
pH, Arterial: 7.253 — ABNORMAL LOW (ref 7.350–7.450)
pO2, Arterial: 83 mmHg (ref 83.0–108.0)

## 2020-07-28 LAB — RESP PANEL BY RT-PCR (FLU A&B, COVID) ARPGX2
Influenza A by PCR: NEGATIVE
Influenza B by PCR: NEGATIVE
SARS Coronavirus 2 by RT PCR: NEGATIVE

## 2020-07-28 LAB — HEMOGLOBIN A1C
Hgb A1c MFr Bld: 9.6 % — ABNORMAL HIGH (ref 4.8–5.6)
Mean Plasma Glucose: 228.82 mg/dL

## 2020-07-28 LAB — MRSA NEXT GEN BY PCR, NASAL: MRSA by PCR Next Gen: NOT DETECTED

## 2020-07-28 SURGERY — IR WITH ANESTHESIA
Anesthesia: General

## 2020-07-28 MED ORDER — LIDOCAINE 2% (20 MG/ML) 5 ML SYRINGE
INTRAMUSCULAR | Status: DC | PRN
  Administered 2020-07-28: 60 mg via INTRAVENOUS

## 2020-07-28 MED ORDER — SODIUM CHLORIDE (PF) 0.9 % IJ SOLN
INTRAVENOUS | Status: AC | PRN
  Administered 2020-07-28 (×6): 25 ug via INTRA_ARTERIAL

## 2020-07-28 MED ORDER — CLEVIDIPINE BUTYRATE 0.5 MG/ML IV EMUL
0.0000 mg/h | INTRAVENOUS | Status: AC
  Administered 2020-07-28 – 2020-07-29 (×6): 21 mg/h via INTRAVENOUS
  Administered 2020-07-29: 10 mg/h via INTRAVENOUS
  Filled 2020-07-28 (×6): qty 100

## 2020-07-28 MED ORDER — ESMOLOL HCL 100 MG/10ML IV SOLN
INTRAVENOUS | Status: DC | PRN
  Administered 2020-07-28: 30 mg via INTRAVENOUS
  Administered 2020-07-28: 40 mg via INTRAVENOUS

## 2020-07-28 MED ORDER — STROKE: EARLY STAGES OF RECOVERY BOOK
Freq: Once | Status: AC
  Filled 2020-07-28: qty 1

## 2020-07-28 MED ORDER — PHENYLEPHRINE 40 MCG/ML (10ML) SYRINGE FOR IV PUSH (FOR BLOOD PRESSURE SUPPORT)
PREFILLED_SYRINGE | INTRAVENOUS | Status: DC | PRN
  Administered 2020-07-28: 80 ug via INTRAVENOUS

## 2020-07-28 MED ORDER — CLEVIDIPINE BUTYRATE 0.5 MG/ML IV EMUL
INTRAVENOUS | Status: DC | PRN
  Administered 2020-07-28: 2 mg/h via INTRAVENOUS

## 2020-07-28 MED ORDER — CHLORHEXIDINE GLUCONATE CLOTH 2 % EX PADS
6.0000 | MEDICATED_PAD | Freq: Every day | CUTANEOUS | Status: DC
  Administered 2020-07-29 – 2020-08-05 (×8): 6 via TOPICAL

## 2020-07-28 MED ORDER — IOHEXOL 300 MG/ML  SOLN
100.0000 mL | Freq: Once | INTRAMUSCULAR | Status: AC | PRN
  Administered 2020-07-28: 55 mL via INTRA_ARTERIAL

## 2020-07-28 MED ORDER — CEFAZOLIN SODIUM-DEXTROSE 2-3 GM-%(50ML) IV SOLR
INTRAVENOUS | Status: DC | PRN
  Administered 2020-07-28: 2 g via INTRAVENOUS

## 2020-07-28 MED ORDER — ASPIRIN 81 MG PO CHEW
CHEWABLE_TABLET | ORAL | Status: AC
  Filled 2020-07-28: qty 1

## 2020-07-28 MED ORDER — ACETAMINOPHEN 160 MG/5ML PO SOLN: 650.0000 mg | ORAL | Status: DC | PRN

## 2020-07-28 MED ORDER — SODIUM CHLORIDE 0.9 % IV SOLN: INTRAVENOUS | Status: DC

## 2020-07-28 MED ORDER — ACETAMINOPHEN 650 MG RE SUPP: 650.0000 mg | RECTAL | Status: DC | PRN

## 2020-07-28 MED ORDER — ACETAMINOPHEN 325 MG PO TABS: 650.0000 mg | ORAL_TABLET | ORAL | Status: DC | PRN

## 2020-07-28 MED ORDER — CEFAZOLIN SODIUM-DEXTROSE 2-4 GM/100ML-% IV SOLN
INTRAVENOUS | Status: AC
  Filled 2020-07-28: qty 100

## 2020-07-28 MED ORDER — CANGRELOR TETRASODIUM 50 MG IV SOLR
INTRAVENOUS | Status: AC
  Filled 2020-07-28: qty 50

## 2020-07-28 MED ORDER — PHENYLEPHRINE HCL-NACL 10-0.9 MG/250ML-% IV SOLN
INTRAVENOUS | Status: DC | PRN
  Administered 2020-07-28: 15 ug/min via INTRAVENOUS

## 2020-07-28 MED ORDER — HYDRALAZINE HCL 20 MG/ML IJ SOLN
5.0000 mg | Freq: Four times a day (QID) | INTRAMUSCULAR | Status: DC | PRN
  Administered 2020-07-28: 5 mg via INTRAVENOUS
  Filled 2020-07-28: qty 1

## 2020-07-28 MED ORDER — CLEVIDIPINE BUTYRATE 0.5 MG/ML IV EMUL
INTRAVENOUS | Status: AC
  Filled 2020-07-28: qty 50

## 2020-07-28 MED ORDER — TICAGRELOR 90 MG PO TABS
ORAL_TABLET | ORAL | Status: AC
  Filled 2020-07-28: qty 2

## 2020-07-28 MED ORDER — LABETALOL HCL 5 MG/ML IV SOLN: 10.0000 mg | Freq: Four times a day (QID) | INTRAVENOUS | Status: DC | PRN

## 2020-07-28 MED ORDER — PROPOFOL 10 MG/ML IV BOLUS
INTRAVENOUS | Status: DC | PRN
  Administered 2020-07-28: 150 mg via INTRAVENOUS
  Administered 2020-07-28: 50 mg via INTRAVENOUS

## 2020-07-28 MED ORDER — LACTATED RINGERS IV SOLN: INTRAVENOUS | Status: DC

## 2020-07-28 MED ORDER — IOHEXOL 300 MG/ML  SOLN
100.0000 mL | Freq: Once | INTRAMUSCULAR | Status: AC | PRN
  Administered 2020-07-28: 35 mL via INTRA_ARTERIAL

## 2020-07-28 MED ORDER — LABETALOL HCL 5 MG/ML IV SOLN
10.0000 mg | Freq: Four times a day (QID) | INTRAVENOUS | Status: DC | PRN
  Administered 2020-07-29: 20 mg via INTRAVENOUS
  Administered 2020-07-29 – 2020-07-31 (×3): 10 mg via INTRAVENOUS
  Administered 2020-07-31 – 2020-08-01 (×2): 20 mg via INTRAVENOUS
  Administered 2020-08-01: 10 mg via INTRAVENOUS
  Administered 2020-08-02 – 2020-08-04 (×4): 20 mg via INTRAVENOUS
  Filled 2020-07-28 (×11): qty 4

## 2020-07-28 MED ORDER — TIROFIBAN HCL IN NACL 5-0.9 MG/100ML-% IV SOLN
INTRAVENOUS | Status: AC
  Filled 2020-07-28: qty 100

## 2020-07-28 MED ORDER — PNEUMOCOCCAL VAC POLYVALENT 25 MCG/0.5ML IJ INJ
0.5000 mL | INJECTION | INTRAMUSCULAR | Status: DC
  Filled 2020-07-28: qty 0.5

## 2020-07-28 MED ORDER — NITROGLYCERIN 1 MG/10 ML FOR IR/CATH LAB
INTRA_ARTERIAL | Status: AC
  Filled 2020-07-28: qty 10

## 2020-07-28 MED ORDER — LABETALOL HCL 5 MG/ML IV SOLN
INTRAVENOUS | Status: DC | PRN
  Administered 2020-07-28: 5 mg via INTRAVENOUS

## 2020-07-28 MED ORDER — SUCCINYLCHOLINE CHLORIDE 200 MG/10ML IV SOSY
PREFILLED_SYRINGE | INTRAVENOUS | Status: DC | PRN
  Administered 2020-07-28: 120 mg via INTRAVENOUS

## 2020-07-28 MED ORDER — ORAL CARE MOUTH RINSE
15.0000 mL | Freq: Two times a day (BID) | OROMUCOSAL | Status: DC
  Administered 2020-07-29 – 2020-08-05 (×15): 15 mL via OROMUCOSAL

## 2020-07-28 MED ORDER — ROCURONIUM BROMIDE 10 MG/ML (PF) SYRINGE
PREFILLED_SYRINGE | INTRAVENOUS | Status: DC | PRN
  Administered 2020-07-28: 50 mg via INTRAVENOUS

## 2020-07-28 MED ORDER — CHLORHEXIDINE GLUCONATE 0.12 % MT SOLN
15.0000 mL | Freq: Two times a day (BID) | OROMUCOSAL | Status: DC
  Administered 2020-07-28 – 2020-08-05 (×16): 15 mL via OROMUCOSAL
  Filled 2020-07-28 (×6): qty 15

## 2020-07-28 MED ORDER — ACETAMINOPHEN 160 MG/5ML PO SOLN
650.0000 mg | ORAL | Status: DC | PRN
  Administered 2020-08-01: 650 mg
  Filled 2020-07-28: qty 20.3

## 2020-07-28 MED ORDER — LACTATED RINGERS IV SOLN: INTRAVENOUS | Status: DC | PRN

## 2020-07-28 MED ORDER — EPTIFIBATIDE 20 MG/10ML IV SOLN
INTRAVENOUS | Status: AC
  Filled 2020-07-28: qty 10

## 2020-07-28 MED ORDER — VERAPAMIL HCL 2.5 MG/ML IV SOLN
INTRAVENOUS | Status: AC
  Filled 2020-07-28: qty 2

## 2020-07-28 MED ORDER — CLOPIDOGREL BISULFATE 300 MG PO TABS
ORAL_TABLET | ORAL | Status: AC
  Filled 2020-07-28: qty 1

## 2020-07-28 MED ORDER — SUGAMMADEX SODIUM 200 MG/2ML IV SOLN
INTRAVENOUS | Status: DC | PRN
  Administered 2020-07-28: 400 mg via INTRAVENOUS

## 2020-07-28 MED ORDER — METOPROLOL TARTRATE 5 MG/5ML IV SOLN
5.0000 mg | INTRAVENOUS | Status: DC | PRN
  Administered 2020-07-28: 2.5 mg via INTRAVENOUS
  Filled 2020-07-28: qty 5

## 2020-07-28 MED ORDER — INSULIN ASPART 100 UNIT/ML IJ SOLN
0.0000 [IU] | INTRAMUSCULAR | Status: DC
  Administered 2020-07-28: 3 [IU] via SUBCUTANEOUS
  Administered 2020-07-28: 2 [IU] via SUBCUTANEOUS
  Administered 2020-07-28 – 2020-07-29 (×2): 5 [IU] via SUBCUTANEOUS
  Administered 2020-07-29 – 2020-07-31 (×6): 2 [IU] via SUBCUTANEOUS
  Administered 2020-07-31: 5 [IU] via SUBCUTANEOUS
  Administered 2020-07-31: 3 [IU] via SUBCUTANEOUS
  Administered 2020-08-01 (×3): 2 [IU] via SUBCUTANEOUS
  Administered 2020-08-01 (×3): 3 [IU] via SUBCUTANEOUS
  Administered 2020-08-02: 5 [IU] via SUBCUTANEOUS
  Administered 2020-08-02 – 2020-08-03 (×5): 3 [IU] via SUBCUTANEOUS
  Administered 2020-08-03: 5 [IU] via SUBCUTANEOUS
  Administered 2020-08-03: 3 [IU] via SUBCUTANEOUS
  Administered 2020-08-03: 2 [IU] via SUBCUTANEOUS
  Administered 2020-08-03 – 2020-08-04 (×2): 3 [IU] via SUBCUTANEOUS
  Administered 2020-08-04: 2 [IU] via SUBCUTANEOUS
  Administered 2020-08-04: 3 [IU] via SUBCUTANEOUS
  Administered 2020-08-04: 5 [IU] via SUBCUTANEOUS
  Administered 2020-08-04: 3 [IU] via SUBCUTANEOUS
  Administered 2020-08-05: 8 [IU] via SUBCUTANEOUS
  Administered 2020-08-05: 5 [IU] via SUBCUTANEOUS
  Administered 2020-08-05: 3 [IU] via SUBCUTANEOUS
  Administered 2020-08-05: 2 [IU] via SUBCUTANEOUS

## 2020-07-28 NOTE — Procedures (Signed)
RT common carotid arteriogram followed by complete revascularization of occluded RT MCA sup divisionwith x 1 pass with 55mm x 40 mm solitaireX retriever and x 1 pass with solitairex 47mmx 40 mm retriever and contact aspiration acieving a TICI 3 revascularization  Post CT No ICH .  29F angioseal for hemostasis at the RT groi puncture site. Distal pulses all all dopplerable.  Extubated. Pupils 58mm Rt =Lt sluggish.  Spontaneous movement on the RT. Beginning to bend LUE No movement of the Lt UE S.Danea Manter MD

## 2020-07-28 NOTE — Progress Notes (Signed)
Dr. Lorrin Goodell made aware that STAT head CT is completed and available to view.

## 2020-07-28 NOTE — Sedation Documentation (Signed)
Assisted with transportation to 4N27. Report given via telephone and at bedside. Pt is moving all extremities but does not follow directions appropriately. Pt writhing in bed. Vitals stable upon departure.  [name] Kitchen

## 2020-07-28 NOTE — Progress Notes (Signed)
Pt intubated and sedated under the care of anesthesia at this time.

## 2020-07-28 NOTE — Code Documentation (Signed)
Patient from home with husband. She was LKW around 0900 eating breakfast when she was noted with a right gaze, facial droop, aphasia, and left sided sensory deficits. Pt was taken to [facility] and a CT/CTA were completed. Per MD pt was positive for a Right MCA M2 occlusion. Pt was transferred to [facility] for IR procedure. She was met by the stroke team and Covid swabbed at the bridge. MD was not able to reach pt's husband so an emergent consent was completed. Pt was taken to [facility] and prepped for procedure. BP 144/90 on arrival. NIHSS 15 (see documentation for details). Pt assessed with a right gaze, left sensory changes, mute, and a facial droop. Pt with a hx of AFIB, DM, HTN, CVA, PE, DVT. Hand off with Loree Fee RN as patient waits for procedural suite to be available. Neuro ICU charge aware of bed need.   Rhyanna Sorce, Rande Brunt, RN  Stroke Response Nurse

## 2020-07-28 NOTE — Anesthesia Preprocedure Evaluation (Addendum)
Anesthesia Evaluation  Patient identified by MRN, date of birth, ID band Patient awake and Patient confused    Reviewed: Allergy & Precautions, NPO status , Patient's Chart, lab work & pertinent test resultsPreop documentation limited or incomplete due to emergent nature of procedure.  Airway Mallampati: II  TM Distance: >3 FB Neck ROM: Full    Dental  (+) Dental Advisory Given   Pulmonary asthma ,           Cardiovascular hypertension, Pt. on medications and Pt. on home beta blockers  Rhythm:Regular Rate:Normal     Neuro/Psych  Neuromuscular disease CVA    GI/Hepatic Neg liver ROS, GERD  ,  Endo/Other  diabetes, Type 2, Oral Hypoglycemic Agents  Renal/GU negative Renal ROS     Musculoskeletal   Abdominal   Peds  Hematology  (+) anemia ,   Anesthesia Other Findings   Reproductive/Obstetrics                            Lab Results  Component Value Date   WBC 12.3 (H) 10/24/2018   HGB 13.2 10/24/2018   HCT 40.6 10/24/2018   MCV 90 10/24/2018   PLT 450 10/24/2018   Lab Results  Component Value Date   CREATININE 1.11 (H) 10/24/2018   BUN 20 10/24/2018   NA 144 10/24/2018   K 4.3 10/24/2018   CL 103 10/24/2018   CO2 25 10/24/2018    Anesthesia Physical Anesthesia Plan  ASA: 4 and emergent  Anesthesia Plan: General   Post-op Pain Management:    Induction: Intravenous  PONV Risk Score and Plan: 3 and Ondansetron, Dexamethasone and Treatment may vary due to age or medical condition  Airway Management Planned: Oral ETT  Additional Equipment: Arterial line  Intra-op Plan:   Post-operative Plan: Possible Post-op intubation/ventilation  Informed Consent:     Dental advisory given and Only emergency history available  Plan Discussed with: CRNA  Anesthesia Plan Comments:        Anesthesia Quick Evaluation

## 2020-07-28 NOTE — Progress Notes (Signed)
Patient belongings at admission: clothes and pair of shoes.  Taken home by patient's husband

## 2020-07-28 NOTE — Consult Note (Signed)
Critical care attending attestation note:  Patient seen and examined and relevant ancillary tests reviewed.  I agree with the assessment and plan of care as outlined by Mick Sell, PA-C. The following reflects my independent critical care time.   Synopsis of assessment and plan: 70 year old female with paroxysmal A. fib on Eliquis, diabetes, hypertension and morbid obesity who presented with acute right MCA stroke, underwent thrombectomy.  Postprocedure patient was extubated but remained lethargic and there was concern for airway protection.  PCCM was consulted to help with medical management   Physical exam: General: Acutely ill-appearing elderly obese Caucasian female, on facemask HEENT: [place]/AT, eyes anicteric.  Moist mucous membranes Neuro: Opens eyes with vocal stimuli, forced right gaze deviation, pupils bilateral equal round reactive to light, flaccid left upper extremity, withdrawing left lower extremity, antigravity on right side Chest: Coarse breath sounds, no wheezes or rhonchi Heart: Regular rate and rhythm, no murmurs or gallops Abdomen: Soft, nontender, nondistended, bowel sounds present Skin: No rash  Labs and images were reviewed  Assessment plan: Acute right MCA stroke status post thrombectomy with TICI 3 results Proximal A. fib with controlled rate Hypertension Acute hypoxic/hypercapnic respiratory failure Probable obstructive sleep apnea Morbid obesity Diabetes type 2  Continue neuro watch Stroke team is following Continue secondary stroke prophylaxis Holding anticoagulation and defer to neurology SBP goal less than 140, currently on Cleviprex infusion Continue labetalol 10 mg every 6 hours as needed if blood pressure > 140 Continue Ventimask for now BiPAP/CPAP at night Dietitian follow-up PT/OT/speech evaluation Continue to monitor blood sugar with CBG goal 140-180  CRITICAL CARE Performed by: Jacky Kindle   Total independent critical care time: 36  minutes  Critical care time was exclusive of separately billable procedures and treating other patients.  Critical care was necessary to treat or prevent imminent or life-threatening deterioration.   Critical care was time spent personally by me on the following activities: development of treatment plan with patient and/or surrogate as well as nursing, discussions with consultants, evaluation of patient's response to treatment, examination of patient, obtaining history from patient or surrogate, ordering and performing treatments and interventions, ordering and review of laboratory studies, ordering and review of radiographic studies, pulse oximetry, re-evaluation of patient's condition and participation in multidisciplinary rounds.  Jacky Kindle MD [facility] See Amion for pager If no response to pager, please call [phone] until 7pm After 7pm, Please call E-link [phone]  07/28/2020, 6:34 PM
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
## 2020-07-28 NOTE — Progress Notes (Signed)
[facility] Progress Note Patient Name: Jennifer Lucero DOB: 1950-10-17 MRN: [id number]   Date of Service  07/28/2020  HPI/Events of Note  Patient was just started on an LR gtt but she's already on an NS gtt and her K+  is 5.2.  eICU Interventions  LR gtt discontinued.        Kerry Kass Carmina Walle 07/28/2020, 8:40 PM

## 2020-07-28 NOTE — Transfer of Care (Signed)
Immediate Anesthesia Transfer of Care Note  Patient: Jennifer Lucero  Procedure(s) Performed: IR WITH ANESTHESIA  Patient Location: ICU  Anesthesia Type:General  Level of Consciousness: drowsy and pateint uncooperative  Airway & Oxygen Therapy: Patient Spontanous Breathing and Patient connected to face mask oxygen  Post-op Assessment: Report given to RN and Post -op Vital signs reviewed and unstable, Anesthesiologist notified  Post vital signs: Reviewed and unstable  Last Vitals:  Vitals Value Taken Time  BP 132/85 07/28/20 1613  Temp    Pulse 112 07/28/20 1624  Resp 23 07/28/20 1624  SpO2 100 % 07/28/20 1624  Vitals shown include unvalidated device data.  Last Pain: There were no vitals filed for this visit.       Complications: No notable events documented.

## 2020-07-28 NOTE — ED Notes (Signed)
Pt from [facility] ED.  Brief stop at bridge for COVID swab and pt straight to IR with stroke team

## 2020-07-28 NOTE — Progress Notes (Signed)
Orthopedic Tech Progress Note Patient Details:  Jennifer Lucero 09/13/1950 [id number]  Ortho Devices Type of Ortho Device: Knee Immobilizer Ortho Device/Splint Location: Right Leg Ortho Device/Splint Interventions: Application   Post Interventions Patient Tolerated: Well  [name] Modesty Rudy 07/28/2020, 4:57 PM

## 2020-07-28 NOTE — Progress Notes (Signed)
Pt remains intubated, sedated, under the care of anesthesia at this time

## 2020-07-28 NOTE — Anesthesia Procedure Notes (Signed)
Arterial Line Insertion Start/End7/06/2020 2:22 PM Performed by: Suzette Battiest, MD, Georgia Duff, CRNA, CRNA  Patient sedated Left, radial was placed Catheter size: 20 G  Attempts: 1 Procedure performed without using ultrasound guided technique. Following insertion, dressing applied and Biopatch. Patient tolerated the procedure well with no immediate complications.

## 2020-07-28 NOTE — Progress Notes (Signed)
1647: Notified MD Deveshwar of patient's SBP >140 despite being on max dose of cleviprex. Verbal order obtained for hydralazine PRN (see MAR). Pt agitated and moving right leg excessively. Ordered restraint but did not use, discontinued. Right leg knee immobilizer ordered and placed.   1732: Notified MD Deveshwar of pt's SBP >140 despite PRN hydralazine given. Verbal order obtained by MD Deveshwar for SBP parameters to be increased from <140 to <150. Orders and PRNs changed to correlate.

## 2020-07-28 NOTE — Anesthesia Procedure Notes (Signed)
Procedure Name: Intubation Date/Time: 07/28/2020 2:03 PM Performed by: Georgia Duff, CRNA Pre-anesthesia Checklist: Patient identified, Emergency Drugs available, Suction available and Patient being monitored Patient Re-evaluated:Patient Re-evaluated prior to induction Oxygen Delivery Method: Circle System Utilized Preoxygenation: Pre-oxygenation with 100% oxygen Induction Type: IV induction Ventilation: Mask ventilation without difficulty Laryngoscope Size: Glidescope and 3 Grade View: Grade I Tube type: Oral Tube size: 7.0 mm Number of attempts: 1 Airway Equipment and Method: Stylet and Oral airway Placement Confirmation: ETT inserted through vocal cords under direct vision, positive ETCO2 and breath sounds checked- equal and bilateral Secured at: 22 cm Tube secured with: Tape Dental Injury: Teeth and Oropharynx as per pre-operative assessment

## 2020-07-28 NOTE — H&P (Signed)
Neurology H&P  CC: Left-neglect  History is obtained from: Referring physician  HPI: Jennifer Lucero is a 70 y.o. female with a history of atrial fibrillation who was supposed to be taking Eliquis, diabetes, hypertension, hyperlipidemia who presents with left-sided weakness that started abruptly this morning.  She was in her normal state of health at 9 AM having breakfast when she had sudden onset of rightward gaze.  She was taken to [facility] ER where she was evaluated by teleneurology and taken for an emergent CT/CT angiogram which demonstrates right M2 occlusion.  It also demonstrates a previous left-sided stroke.   LKW: 9 AM tpa given?: No, anticoagulation IR Thrombectomy? yes Modified Rankin Scale: 0-Completely asymptomatic and back to baseline post- stroke(per referring teleneurologist)    ROS: Unable to obtain due to altered mental status.   Past Medical History:  Diagnosis Date   Anemia    Angina pectoris ([place]) 10/24/2018   Anxiety 06/15/2017   Anxiety    Asthma 06/15/2017   Atrial fibrillation (HCC)    Chest pain 06/15/2017   Chronic anticoagulation 07/09/2014   DDD (degenerative disc disease), lumbar 01/13/2019   Last Assessment & Plan:  Formatting of this note might be different from the original. 70 year old female with chronic low back and right-sided L2-4 radicular symptoms.  Recently updated MRI shows multilevel degeneration, level of greatest pathology at L3-4 where there is moderate broad-based disc osteophyte complex indents the thecal sac and combines with mild facet and ligamentum flavum hypertro   Diabetes (HCC)    Diabetic neuropathy (HCC) 06/15/2017   Esophagitis, reflux 06/15/2017   Essential hypertension 10/24/2018   GERD (gastroesophageal reflux disease)    History of pulmonary embolus (PE) 11/01/2017   Hyperlipidemia 06/15/2017   Insomnia 02/05/2019   Last Assessment & Plan:  Formatting of this note might be different from the original. Will trial trazodone  50-100 mg q.h.s..  Try 50 for 1 week and increase to 100 if no response, as tolerated.   Knee pain    Lumbago    Lumbar radiculopathy 01/13/2019   Last Assessment & Plan:  Formatting of this note might be different from the original. Discontinue gabapentin due to drowsiness and ataxia.  Continue with physical therapy as directed.   Osteoporosis 06/15/2017   Pulmonary embolism ([place]) 06/15/2017   Splenic infarction    Stroke [facility]) 06/15/2017   Vitamin D deficiency      Family History  Problem Relation Age of Onset   Heart disease Mother    Diabetes Brother      Social History:  reports that she has never smoked. She has never used smokeless tobacco. She reports previous alcohol use. She reports previous drug use.   Prior to Admission medications   Medication Sig Start Date End Date Taking? Authorizing Provider  albuterol (PROVENTIL HFA;VENTOLIN HFA) 108 (90 Base) MCG/ACT inhaler Inhale 2 puffs into the lungs every 4 (four) hours as needed for wheezing or shortness of breath.  12/04/15   [provider]  ALPRAZolam Duanne Moron) 0.5 MG tablet Take 0.25 mg by mouth 2 (two) times daily.  11/13/16   [provider]  apixaban (ELIQUIS) 5 MG TABS tablet Take 5 mg by mouth 2 (two) times daily.  10/28/15   [provider]  aspirin EC 81 MG tablet Take 1 tablet by mouth daily.    [provider]  esomeprazole (NEXIUM) 40 MG capsule Take 1 capsule by mouth daily.     [provider]  Fe Fum-FA-B Cmp-C-Zn-Mg-Mn-Cu (  CENTRATEX) 106-1 MG CAPS Take 1 capsule by mouth daily. 10/27/19   [provider]  fluticasone (FLONASE) 50 MCG/ACT nasal spray Place 1 spray into both nostrils daily. 01/10/19   [provider]  gabapentin (NEURONTIN) 300 MG capsule Take 1 tab PO QHS x 3 days, then 1 tab qAM and qPM x 3 days, then 1 tab TID thereafter as tolerated. 01/13/19   [provider]  gentamicin (GARAMYCIN) 0.3 % ophthalmic solution Place 1 drop  into the right eye 4 (four) times daily. 03/25/19   [provider]  glimepiride (AMARYL) 4 MG tablet Take 4 mg by mouth daily.  11/29/15   [provider]  ibuprofen (ADVIL) 200 MG tablet Take 400 mg by mouth every 6 (six) hours as needed for mild pain.    [provider]  insulin detemir (LEVEMIR) 100 UNIT/ML FlexPen Inject 52 Units into the skin daily.  11/18/15   [provider]  isosorbide mononitrate (IMDUR) 120 MG 24 hr tablet Take 1 tablet (120 mg total) by mouth daily. Patient needs an appointment for further refills. 2 nd attempt 07/23/20   Revankar, Reita Cliche, MD  lisinopril (PRINIVIL,ZESTRIL) 40 MG tablet Take 40 mg by mouth daily.  04/30/17   [provider]  lisinopril (ZESTRIL) 20 MG tablet Take 1 tablet by mouth daily. 04/20/20   [provider]  loratadine (CLARITIN) 10 MG tablet Take 1 tablet by mouth daily as needed for allergies.  10/13/15   [provider]  metFORMIN (GLUCOPHAGE) 500 MG tablet Take 500 mg by mouth 2 (two) times daily with a meal.  12/06/15   [provider]  metoprolol succinate (TOPROL-XL) 50 MG 24 hr tablet Take 1 tablet (50 mg total) by mouth daily. 06/13/19   Revankar, Reita Cliche, MD  mupirocin ointment (BACTROBAN) 2 % Apply 1 application topically 3 (three) times daily. 03/18/20   [provider]  nitroGLYCERIN (NITROSTAT) 0.4 MG SL tablet Place 1 tablet under the tongue every 5 (five) minutes as needed for chest pain.     [provider]  oxybutynin (DITROPAN) 5 MG tablet  04/22/19   [provider]  pravastatin (PRAVACHOL) 10 MG tablet Take 10 mg by mouth daily. 09/25/18   [provider]  tiZANidine (ZANAFLEX) 4 MG capsule Take 2-4 mg by mouth 3 (three) times daily as needed for muscle spasms.    [provider]  traMADol (ULTRAM) 50 MG tablet Take 1 tablet by mouth daily as needed for moderate pain.  11/13/16   [provider]   triamterene-hydrochlorothiazide (MAXZIDE-25) 37.5-25 MG tablet Take 1-2 tablets by mouth daily. 06/06/19   [provider]  Vitamin D, Ergocalciferol, (DRISDOL) 50000 units CAPS capsule Take 50,000 Units by mouth every Sunday.  05/21/17   [provider]     Exam: Current vital signs: There were no vitals taken for this visit.   Physical Exam  Constitutional: Appears well-developed and well-nourished.  Psych: Affect appropriate to situation Eyes: No scleral injection HENT: No OP obstrucion Head: Normocephalic.  Cardiovascular: Normal rate and regular rhythm.  Respiratory: Effort normal and breath sounds normal to anterior ascultation GI: Soft.  No distension. There is no tenderness.  Skin: WDI  Neuro: Mental Status: Patient is awake, alert, she follows commands readily, but she does not speak, I suspect this is more due to dysarthria cranial Nerves: II: Left hemianopia. Pupils are equal, round, and reactive to light.   III,IV, VI: Right gaze deviation V: VII: Severe  left facial droop Motor: She has mild left leg >arm weakness 4+/5, 5/5 on the right Sensory: She endorses symmetric sensation, but appears to respond slightly less on the left than the right, she clearly extinguishes to double simultaneous stimulation Cerebellar: No clear ataxia  I have reviewed labs in epic and the pertinent results are: INR and PTT are both completely normal Glucoses were in the 200s  I have reviewed the images obtained: CT-previous left-sided stroke, CTA-right M2 occlusion  Primary Diagnosis:  Cerebral infarction due to embolism of  right middle cerebral artery.   Secondary Diagnosis: Essential (primary) hypertension, Paroxysmal atrial fibrillation, and Type 2 diabetes mellitus with hyperglycemia    Impression: 70 year old female with right M2 occlusion in the setting of atrial fibrillation.  I suspect that this is an embolic stroke.  It is unclear when she last took her  Eliquis, but the normal coags would suggest that she may be noncompliant.  She was not deemed a candidate for tPA at [facility] due to anticoagulation but she is a candidate for thrombectomy and therefore she was transferred to [facility] emergently.  Plan: - HgbA1c, fasting lipid panel - MRI  of the brain without contrast - Frequent neuro checks - Echocardiogram - Prophylactic therapy-pending outcome of thrombectomy - Risk factor modification - Telemetry monitoring - PT consult, OT consult, Speech consult -SSI for DM - Stroke team to follow    This patient is critically ill and at significant risk of neurological worsening, death and care requires constant monitoring of vital signs, hemodynamics,respiratory and cardiac monitoring, neurological assessment, discussion with family, other specialists and medical decision making of high complexity. I spent 55 minutes of neurocritical care time  in the care of  this patient. This was time spent independent of any time provided by nurse practitioner or PA.   Roland Rack, MD [facility] [phone]  If 7pm- 7am, please page neurology on call as listed in [place]. 07/28/2020  1:44 PM

## 2020-07-29 ENCOUNTER — Inpatient Hospital Stay (HOSPITAL_COMMUNITY): Payer: Medicare Other

## 2020-07-29 ENCOUNTER — Encounter (HOSPITAL_COMMUNITY): Payer: Self-pay | Admitting: Radiology

## 2020-07-29 DIAGNOSIS — I63511 Cerebral infarction due to unspecified occlusion or stenosis of right middle cerebral artery: Secondary | ICD-10-CM

## 2020-07-29 DIAGNOSIS — J9601 Acute respiratory failure with hypoxia: Secondary | ICD-10-CM | POA: Diagnosis not present

## 2020-07-29 LAB — CBC WITH DIFFERENTIAL/PLATELET
Abs Immature Granulocytes: 0.16 10*3/uL — ABNORMAL HIGH (ref 0.00–0.07)
Basophils Absolute: 0 10*3/uL (ref 0.0–0.1)
Basophils Relative: 0 %
Eosinophils Absolute: 0 10*3/uL (ref 0.0–0.5)
Eosinophils Relative: 0 %
HCT: 31.5 % — ABNORMAL LOW (ref 36.0–46.0)
Hemoglobin: 9.8 g/dL — ABNORMAL LOW (ref 12.0–15.0)
Immature Granulocytes: 1 %
Lymphocytes Relative: 13 %
Lymphs Abs: 1.9 10*3/uL (ref 0.7–4.0)
MCH: 28.2 pg (ref 26.0–34.0)
MCHC: 31.1 g/dL (ref 30.0–36.0)
MCV: 90.8 fL (ref 80.0–100.0)
Monocytes Absolute: 1.6 10*3/uL — ABNORMAL HIGH (ref 0.1–1.0)
Monocytes Relative: 11 %
Neutro Abs: 10.9 10*3/uL — ABNORMAL HIGH (ref 1.7–7.7)
Neutrophils Relative %: 75 %
Platelets: 332 10*3/uL (ref 150–400)
RBC: 3.47 MIL/uL — ABNORMAL LOW (ref 3.87–5.11)
RDW: 16.3 % — ABNORMAL HIGH (ref 11.5–15.5)
WBC: 14.6 10*3/uL — ABNORMAL HIGH (ref 4.0–10.5)
nRBC: 0 % (ref 0.0–0.2)

## 2020-07-29 LAB — GLUCOSE, CAPILLARY
Glucose-Capillary: 210 mg/dL — ABNORMAL HIGH (ref 70–99)
Glucose-Capillary: 140 mg/dL — ABNORMAL HIGH (ref 70–99)
Glucose-Capillary: 99 mg/dL (ref 70–99)
Glucose-Capillary: 92 mg/dL (ref 70–99)
Glucose-Capillary: 85 mg/dL (ref 70–99)
Glucose-Capillary: 95 mg/dL (ref 70–99)

## 2020-07-29 LAB — ECHOCARDIOGRAM COMPLETE
AR max vel: 1.42 cm2
AV Area VTI: 1.47 cm2
AV Area mean vel: 1.43 cm2
AV Mean grad: 9 mmHg
AV Peak grad: 16.8 mmHg
Ao pk vel: 2.05 m/s
Area-P 1/2: 4.29 cm2
S' Lateral: 3 cm
Weight: 3456.81 oz

## 2020-07-29 LAB — BASIC METABOLIC PANEL
Anion gap: 10 (ref 5–15)
BUN: 20 mg/dL (ref 8–23)
CO2: 20 mmol/L — ABNORMAL LOW (ref 22–32)
Calcium: 8.2 mg/dL — ABNORMAL LOW (ref 8.9–10.3)
Chloride: 112 mmol/L — ABNORMAL HIGH (ref 98–111)
Creatinine, Ser: 1.3 mg/dL — ABNORMAL HIGH (ref 0.44–1.00)
GFR, Estimated: 44 mL/min — ABNORMAL LOW (ref >60)
Glucose, Bld: 194 mg/dL — ABNORMAL HIGH (ref 70–99)
Potassium: 4.5 mmol/L (ref 3.5–5.1)
Sodium: 142 mmol/L (ref 135–145)

## 2020-07-29 LAB — HEMOGLOBIN A1C
Hgb A1c MFr Bld: 9.6 % — ABNORMAL HIGH (ref 4.8–5.6)
Mean Plasma Glucose: 228.82 mg/dL

## 2020-07-29 LAB — LIPID PANEL
Cholesterol: 129 mg/dL (ref 0–200)
HDL: 47 mg/dL (ref >40)
LDL Cholesterol: 65 mg/dL (ref 0–99)
Total CHOL/HDL Ratio: 2.7 RATIO
Triglycerides: 84 mg/dL (ref <150)
VLDL: 17 mg/dL (ref 0–40)

## 2020-07-29 MED ORDER — LEVETIRACETAM IN NACL 500 MG/100ML IV SOLN
500.0000 mg | Freq: Two times a day (BID) | INTRAVENOUS | Status: DC
  Administered 2020-07-29 – 2020-07-30 (×3): 500 mg via INTRAVENOUS
  Filled 2020-07-29 (×3): qty 100

## 2020-07-29 MED ORDER — ASPIRIN 300 MG RE SUPP
300.0000 mg | Freq: Every day | RECTAL | Status: DC
  Administered 2020-07-30: 300 mg via RECTAL
  Filled 2020-07-29: qty 1

## 2020-07-29 MED ORDER — SODIUM CHLORIDE 0.9 % IV SOLN
INTRAVENOUS | Status: DC | PRN
  Administered 2020-07-29: 1000 mL via INTRAVENOUS

## 2020-07-29 MED ORDER — INSULIN GLARGINE 100 UNIT/ML ~~LOC~~ SOLN
20.0000 [IU] | Freq: Every day | SUBCUTANEOUS | Status: DC
  Administered 2020-07-29 – 2020-08-05 (×8): 20 [IU] via SUBCUTANEOUS
  Filled 2020-07-29 (×9): qty 0.2

## 2020-07-29 MED ORDER — SODIUM CHLORIDE 0.9 % IV SOLN
2000.0000 mg | Freq: Once | INTRAVENOUS | Status: AC
  Administered 2020-07-29: 2000 mg via INTRAVENOUS
  Filled 2020-07-29: qty 20

## 2020-07-29 MED ORDER — SODIUM CHLORIDE 0.9 % IV SOLN: INTRAVENOUS | Status: DC

## 2020-07-29 NOTE — Procedures (Signed)
Patient Name: Jennifer Lucero  MRN: [id number]  Epilepsy Attending: Lora Havens  Referring Physician/Provider: Tollie Eth, NP Date: 07/29/2020 Duration: 24 mins  Patient history: 70 year old female with right M2 occlusion in the setting of atrial fibrillation.  Had seizure-like episode overnight.  EEG to evaluate for seizures.  Level of alertness: Awake  AEDs during EEG study: LEV  Technical aspects: This EEG study was done with scalp electrodes positioned according to the 10-20 International system of electrode placement. Electrical activity was acquired at a sampling rate of 500Hz  and reviewed with a high frequency filter of 70Hz  and a low frequency filter of 1Hz . EEG data were recorded continuously and digitally stored.   Description: No posterior dominant rhythm seen.  EEG showed continuous generalized and lateralized right hemisphere 3-5 Hz theta-delta slowing. Hyperventilation and photic stimulation were not peRight hemisphere.  rformed.     ABNORMALITY - Continuous slow, generalized and lateralized right hemisphere  IMPRESSION: This study is suggestive of cortical dysfunction arising from right hemisphere, likely secondary to underlying structural abnormality/stroke. Additionally, there is moderate diffuse encephalopathy, nonspecific etiology. No seizures or epileptiform discharges were seen throughout the recording.  If concern for ictal-interictal activity persists, consider long term monitoring.   [name]

## 2020-07-29 NOTE — Progress Notes (Signed)
  Echocardiogram 2D Echocardiogram has been performed.  Merrie Roof F 07/29/2020, 10:53 AM

## 2020-07-29 NOTE — Progress Notes (Signed)
Dr. Lorrin Goodell made aware that STAT head CT done and ready to view.

## 2020-07-29 NOTE — Progress Notes (Signed)
STROKE TEAM PROGRESS NOTE   INTERVAL HISTORY Her husband is at the bedside.  Patient is sleepy but can be aroused and follows few simple commands on the right side.  She continues to have right gaze preference but can look to the left past midline.  She is able to move the left arm and leg slightly.  She had a seizure earlier this morning and emergent CT scan of the head was obtained which showed a right insular and periventricular acute infarct in slight subarachnoid density in the right vertex possibly subarachnoid blood.  She has been started on Keppra and has not had any further seizures.  Blood pressure adequately controlled.  She is off Cleviprex drip.  Vitals:   07/29/20 0645 07/29/20 0700 07/29/20 0800 07/29/20 0900  BP:  132/67 (!) 145/45 (!) 112/91  Pulse: (!) 104 98 (!) 104 (!) 111  Resp: (!) 23 19 (!) 21 18  Temp:   100 F (37.8 C)   TempSrc:   Axillary   SpO2: 98% 96% 97% 96%  Weight:       CBC:  Recent Labs  Lab 07/28/20 1708 07/29/20 0455  WBC 12.6* 14.6*  NEUTROABS  --  10.9*  HGB 11.0* 9.8*  HCT 34.0* 31.5*  MCV 90.7 90.8  PLT 310 428   Basic Metabolic Panel:  Recent Labs  Lab 07/28/20 1708 07/29/20 0455  NA 140 142  K 5.2* 4.5  CL 113* 112*  CO2 19* 20*  GLUCOSE 130* 194*  BUN 18 20  CREATININE 1.21* 1.30*  CALCIUM 8.3* 8.2*   Lipid Panel:  Recent Labs  Lab 07/29/20 0455  CHOL 129  TRIG 84  HDL 47  CHOLHDL 2.7  VLDL 17  LDLCALC 65   HgbA1c:  Recent Labs  Lab 07/29/20 0455  HGBA1C 9.6*   Urine Drug Screen: No results for input(s): LABOPIA, COCAINSCRNUR, LABBENZ, AMPHETMU, THCU, LABBARB in the last 168 hours.  Alcohol Level No results for input(s): ETH in the last 168 hours.  IMAGING past 24 hours CT HEAD WO CONTRAST  Result Date: 07/29/2020 IMPRESSION:  1. Right insular and peri-insular acute infarct.  2. Increased subarachnoid density at the right vertex, presumably diluted subarachnoid blood.   CT HEAD WO CONTRAST  Result Date:  07/28/2020 IMPRESSION:  1. Expected evolution of right MCA territory infarct without hemorrhagic conversion or mass effect.  2. Old left MCA infarct.   PHYSICAL EXAM Physical Exam  Constitutional: Appears well-developed and well-nourished. Psych: Affect appropriate to situation Eyes: No scleral injection HENT: No OP obstrucion Head: Normocephalic. Cardiovascular: Normal rate and regular rhythm. Respiratory: Effort normal and breath sounds normal to anterior ascultation GI: Soft.  No distension. There is no tenderness. Skin: WDI   Neuro: Mental Status: Patient is drowsy but can be aroused, follows few midline and simple commands  Cranial Nerves: II: Pupils are equal, round, and reactive to light.   III,IV, VI: Right gaze preference but able to cross midline V: VII: left facial droop  Motor: LU extremity: amputation above wrist able to move stump minimally RU extremity: follow commands, shows 2 fingers, makes a fist on command, antigravity 3/5 RL extremity, follow commands, wiggles toes, antigravity 3/5 LL extremity, follow commands, wiggles toes, antigravity 3/5   Sensory: She endorses symmetric sensation, but appears to respond slightly less on the left than the right, she clearly extinguishes to double simultaneous stimulation Cerebellar: No clear ataxia  ASSESSMENT/PLAN Jennifer Lucero is a 70 y.o. female with history of A-fib  on eliquis, DM2, HTN, HLD presenting with left sided weakness and rightward gaze preference.  CT head showed acute right MCA territory infarct.  CTA showed right M2 occlusion.  Now s/p thrombectomy with complete revascularization TICI 3.   Stroke right MCA stroke secondary to right M2 occlusion in the setting of atrial fibrillation on Eliquis s/p mechanical thrombectomy acieving a TICI 3 revascularization CT head right MCA stroke.  CTA head & neck: right M2 occlusion Repeat CT head: Right insular and peri-insular acute infarct. Increased  subarachnoid density at the right vertex. MRI  Pending 2D Echo pending SARS coronavirus negative LDL 65 HgbA1c 9.6 VTE prophylaxis - SCDs    Diet   Diet NPO time specified   aspirin 81 mg daily and Eliquis (apixaban) daily prior to admission, now on No antithrombotic. Awaiting MRI to evaluate for [facility]. Will restart eliquis if no evidence of hemorrhage on imaging and if able to swallow.  Consider starting Heparin infusion if unable to pass swallow evaluation.   Therapy recommendations:  pending Disposition:  pending  Seizures Seizure activity at approximately 3 am  Loaded with Keppra 2 grams IV Maintenance Keppra 500 mg bid Eval closely for seizure activity  Hypertension Home meds:  imdur 120mg  daily, lisinopril 20mg  daily, toprol XL 50mg  daily Unstable, requiring cleveprix SBP goal changed to <150 overnight as patient unable to meet previous goal with max dose of cleviprex Hydralazine prn Long-term BP goal normotensive  Hyperlipidemia Home meds:  Pravastatin 10mg  daily, resume pravastatin LDL 65, goal < 70  Continue statin at discharge  Atrial fibrillation Takes Eliquis 5mg  bid at home Will resume if MR brain is negative for hemorrhage and patient able to swallow   Diabetes type II Uncontrolled Home meds:  levemir, metformin 500mg  bid, glimepiride 4mg  daily HgbA1c 9.6, goal < 7.0 CBGs Recent Labs    07/28/20 2336 07/29/20 0342 07/29/20 0757  GLUCAP 203* 210* 140*    SSI  Other Stroke Risk Factors Advanced Age >/= 65  Remote ETOH use and drug use Obesity, Body mass index is 37.09 kg/m., BMI >/= 30 associated with increased stroke risk, recommend weight loss, diet and exercise as appropriate  Hx stroke: left MCA infact, May 2019  Other Active Problems Diabetic neuropathy  GERD: nexium 40mg  daily Hx of pulmonary embolus (11/01/2017) Osteoporosis Splenic infarction Asthma anxiety  Hospital day # 1  Jennifer Lucero, ACNP-BC 07/29/2020 12:00  pm  Stroke MD note : I have personally obtained history,examined this patient, reviewed notes, independently viewed imaging studies, participated in medical decision making and plan of care.ROS completed by me personally and pertinent positives fully documented  I have made any additions or clarifications directly to the above note. Agree with note above.  Patient presented with right M2 occlusion due to atrial fibrillation despite being on anticoagulation with Eliquis.  She underwent successful mechanical thrombectomy but early this morning developed seizures.  CT scan shows small right upper insular subcortical infarct but there is trace subarachnoid hyperdensity raising concern for his blood.  Recommend check MRI scan and if no subarachnoid blood is noted we will put her on IV heparin.  Maintain strict control of blood pressure with systolic goal between 419-622 for the first 24 hours score post thrombectomy protocol.  Mobilize out of bed.  Physical occupational and speech therapy consults.  Check echocardiogram.  Long discussion with the patient and husband at the bedside and answered questions. This patient is critically ill and at significant risk of neurological worsening, death and care  requires constant monitoring of vital signs, hemodynamics,respiratory and cardiac monitoring, extensive review of multiple databases, frequent neurological assessment, discussion with family, other specialists and medical decision making of high complexity.I have made any additions or clarifications directly to the above note.This critical care time does not reflect procedure time, or teaching time or supervisory time of PA/NP/Med Resident etc but could involve care discussion time.  I spent 30 minutes of neurocritical care time  in the care of  this patient.      Antony Contras, MD Medical Director [facility] Pager: [phone] 07/29/2020 2:21 PM  To contact Stroke Continuity provider, please refer  to [email or web address] After hours, contact General Neurology

## 2020-07-29 NOTE — Progress Notes (Signed)
Referring Physician(s): Dr. Leonel Ramsay  Supervising Physician: Luanne Bras  Patient Status:  [facility] - In-pt  Chief Complaint: R MCA occlusion  Subjective: Patient with seizure overnight, post-ictal.  Neuro exam largely unchanged compared to yesterday. Spontaneous movement on right, following some commands.  Only flickers of movement on the left, especially in LUE.  Family at bedside.   Allergies: Buspirone, Ciprofloxacin, Ciprofloxacin-fluocinolone pf, and Codeine  Medications: Prior to Admission medications   Medication Sig Start Date End Date Taking? Authorizing Provider  albuterol (PROVENTIL HFA;VENTOLIN HFA) 108 (90 Base) MCG/ACT inhaler Inhale 2 puffs into the lungs every 4 (four) hours as needed for wheezing or shortness of breath.  12/04/15   [provider]  ALPRAZolam Duanne Moron) 0.5 MG tablet Take 0.25 mg by mouth 2 (two) times daily.  11/13/16   [provider]  apixaban (ELIQUIS) 5 MG TABS tablet Take 5 mg by mouth 2 (two) times daily.  10/28/15   [provider]  aspirin EC 81 MG tablet Take 1 tablet by mouth daily.    [provider]  esomeprazole (NEXIUM) 40 MG capsule Take 1 capsule by mouth daily.     [provider]  Fe Fum-FA-B Cmp-C-Zn-Mg-Mn-Cu (CENTRATEX) 106-1 MG CAPS Take 1 capsule by mouth daily. 10/27/19   [provider]  fluticasone (FLONASE) 50 MCG/ACT nasal spray Place 1 spray into both nostrils daily. 01/10/19   [provider]  gabapentin (NEURONTIN) 300 MG capsule Take 1 tab PO QHS x 3 days, then 1 tab qAM and qPM x 3 days, then 1 tab TID thereafter as tolerated. 01/13/19   [provider]  gentamicin (GARAMYCIN) 0.3 % ophthalmic solution Place 1 drop into the right eye 4 (four) times daily. 03/25/19   [provider]  glimepiride (AMARYL) 4 MG tablet Take 4 mg by mouth daily.  11/29/15   [provider]  ibuprofen (ADVIL) 200 MG tablet Take 400 mg by mouth every  6 (six) hours as needed for mild pain.    [provider]  insulin detemir (LEVEMIR) 100 UNIT/ML FlexPen Inject 52 Units into the skin daily.  11/18/15   [provider]  isosorbide mononitrate (IMDUR) 120 MG 24 hr tablet Take 1 tablet (120 mg total) by mouth daily. Patient needs an appointment for further refills. 2 nd attempt 07/23/20   Revankar, Reita Cliche, MD  lisinopril (PRINIVIL,ZESTRIL) 40 MG tablet Take 40 mg by mouth daily.  04/30/17   [provider]  lisinopril (ZESTRIL) 20 MG tablet Take 1 tablet by mouth daily. 04/20/20   [provider]  loratadine (CLARITIN) 10 MG tablet Take 1 tablet by mouth daily as needed for allergies.  10/13/15   [provider]  metFORMIN (GLUCOPHAGE) 500 MG tablet Take 500 mg by mouth 2 (two) times daily with a meal.  12/06/15   [provider]  metoprolol succinate (TOPROL-XL) 50 MG 24 hr tablet Take 1 tablet (50 mg total) by mouth daily. 06/13/19   Revankar, Reita Cliche, MD  mupirocin ointment (BACTROBAN) 2 % Apply 1 application topically 3 (three) times daily. 03/18/20   [provider]  nitroGLYCERIN (NITROSTAT) 0.4 MG SL tablet Place 1 tablet under the tongue every 5 (five) minutes as needed for chest pain.     [provider]  oxybutynin (DITROPAN) 5 MG tablet  04/22/19   [provider]  pravastatin (PRAVACHOL) 10 MG tablet Take 10 mg by mouth daily. 09/25/18   [provider]  tiZANidine (ZANAFLEX) 4  MG capsule Take 2-4 mg by mouth 3 (three) times daily as needed for muscle spasms.    [provider]  traMADol (ULTRAM) 50 MG tablet Take 1 tablet by mouth daily as needed for moderate pain.  11/13/16   [provider]  triamterene-hydrochlorothiazide (MAXZIDE-25) 37.5-25 MG tablet Take 1-2 tablets by mouth daily. 06/06/19   [provider]  Vitamin D, Ergocalciferol, (DRISDOL) 50000 units CAPS capsule Take 50,000 Units by mouth every Sunday.  05/21/17    [provider]     Vital Signs: BP (!) 112/91   Pulse (!) 111   Temp 100 F (37.8 C) (Axillary)   Resp 18   Wt 216 lb 0.8 oz (98 kg)   SpO2 96%   BMI 37.09 kg/m   Physical Exam NAD, somnolent but arousable, tremulous  Neuro: opens eyes to voice, R gaze preference but will cross midline. Pupils equal.  Moves R upper and lower extremity spontaneously and to command.  Attempts to move L upper and lower extremity with inconsistent response.  Groin: soft, dressing clean and dry.  No evidence of pseudoaneurysm or hematoma.   Imaging: CT HEAD WO CONTRAST  Result Date: 07/29/2020 CLINICAL DATA:  Nontraumatic seizure EXAM: CT HEAD WITHOUT CONTRAST TECHNIQUE: Contiguous axial images were obtained from the base of the skull through the vertex without intravenous contrast. COMPARISON:  Yesterday FINDINGS: Brain: Cytotoxic edema seen at the right insula and adjacent frontal white matter. Although not hyperdense there is definite asymmetric increased density of sulci at the right vertex when compared to the left. Remote left MCA territory infarction with encephalomalacia involving the cortex at the insula and adjacent frontal lobe. Asymmetric atrophy of the left cerebral hemisphere. Vascular: No hyperdense vessel or unexpected calcification. Skull: Normal. Negative for fracture or focal lesion. Sinuses/Orbits: No acute finding. Other: IMPRESSION: 1. Right insular and peri-insular acute infarct. 2. Increased subarachnoid density at the right vertex, presumably diluted subarachnoid blood. Electronically Signed   By: Monte Fantasia M.D.   On: 07/29/2020 04:38   CT HEAD WO CONTRAST  Result Date: 07/28/2020 CLINICAL DATA:  Stroke follow-up EXAM: CT HEAD WITHOUT CONTRAST TECHNIQUE: Contiguous axial images were obtained from the base of the skull through the vertex without intravenous contrast. COMPARISON:  Head CT 07/28/2020 FINDINGS: Brain: Old left MCA infarct is unchanged. There is gyral swelling  throughout the right MCA territory with mild hyperdensity. No acute hemorrhage. No midline shift or other mass effect. No hydrocephalus. Vascular: Negative Skull: Normal Sinuses/Orbits: Small amount of fluid both mastoids. Minimal maxillary sinus mucosal thickening. Other: None IMPRESSION: 1. Expected evolution of right MCA territory infarct without hemorrhagic conversion or mass effect. 2. Old left MCA infarct. Electronically Signed   By: Ulyses Jarred M.D.   On: 07/28/2020 20:08   EEG adult  Result Date: 07/29/2020 Lora Havens, MD     07/29/2020 10:20 AM Patient Name: Jennifer Lucero MRN: [id number] Epilepsy Attending: Lora Havens Referring Physician/Provider: Tollie Eth, NP Date: 07/29/2020 Duration: 24 mins Patient history: 71 year old female with right M2 occlusion in the setting of atrial fibrillation.  Had seizure-like episode overnight.  EEG to evaluate for seizures. Level of alertness: Awake AEDs during EEG study: LEV Technical aspects: This EEG study was done with scalp electrodes positioned according to the 10-20 International system of electrode placement. Electrical activity was acquired at a sampling rate of 500Hz  and reviewed with a high frequency filter of 70Hz  and a low frequency filter of 1Hz . EEG data were recorded  continuously and digitally stored. Description: No posterior dominant rhythm seen.  EEG showed continuous generalized and lateralized right hemisphere 3-5 Hz theta-delta slowing. Hyperventilation and photic stimulation were not peRight hemisphere.  rformed.   ABNORMALITY - Continuous slow, generalized and lateralized right hemisphere IMPRESSION: This study is suggestive of cortical dysfunction arising from right hemisphere, likely secondary to underlying structural abnormality/stroke. Additionally, there is moderate diffuse encephalopathy, nonspecific etiology. No seizures or epileptiform discharges were seen throughout the recording. If concern for  ictal-interictal activity persists, consider long term monitoring. Priyanka O Yadav    Labs:  CBC: Recent Labs    07/28/20 1515 07/28/20 1708 07/29/20 0455  WBC  --  12.6* 14.6*  HGB 9.5* 11.0* 9.8*  HCT 28.0* 34.0* 31.5*  PLT  --  310 332    COAGS: No results for input(s): INR, APTT in the last 8760 hours.  BMP: Recent Labs    07/28/20 1515 07/28/20 1708 07/29/20 0455  NA 144 140 142  K 4.1 5.2* 4.5  CL  --  113* 112*  CO2  --  19* 20*  GLUCOSE  --  130* 194*  BUN  --  18 20  CALCIUM  --  8.3* 8.2*  CREATININE  --  1.21* 1.30*  GFRNONAA  --  48* 44*    LIVER FUNCTION TESTS: Recent Labs    07/28/20 1708  BILITOT 0.7  AST 41  ALT 18  ALKPHOS 38  PROT 6.1*  ALBUMIN 3.1*    Assessment and Plan: R MCA occlusion s/p arteriogram with complete revascularization of occluded RT MCA sup divisionwith x 1 pass with 42mm x 40 mm solitaireX retriever and x 1 pass with solitairex 3mmx 40 mm retriever and contact aspiration acieving a TICI 3 revascularization Patient with history of prior left-sided stroke, but independent and participating in all ADLs at home per family.  Patient s/p seizure overnight.  Completed EEG this AM.  CT Head reviewed. Planning for MR today as well.  Patient remains post-ictal, although Neuro exam today overall is consistent with findings from yesterday.  Groin soft and stable.  No issues noted.  IR available as needed.   Electronically Signed: Docia Barrier, PA 07/29/2020, 10:57 AM   I spent a total of 15 Minutes at the the patient's bedside AND on the patient's hospital floor or unit, greater than 50% of which was counseling/coordinating care for R MCA occlusion.

## 2020-07-29 NOTE — Progress Notes (Signed)
PT Cancellation Note  Patient Details Name: Jennifer Lucero MRN: [id number] DOB: 1950/11/12   Cancelled Treatment:    Reason Eval/Treat Not Completed: Patient's level of consciousness. Pt with seizure last night and post ictal. RN reporting low level of arousal. Will plan to follow-up tomorrow.   Moishe Spice, PT, DPT Acute Rehabilitation Services  Pager: [phone] Office: [place] 07/29/2020, 3:20 PM

## 2020-07-29 NOTE — Progress Notes (Signed)
SLP Cancellation Note  Patient Details Name: Jennifer Lucero MRN: [id number] DOB: July 29, 1950   Cancelled treatment:        RN reports pt is lethargic, difficult to arouse. Pt had seizure last night and EEG just arrived to room. Will plan to check tomorrow and RN will call therapist if she becomes appropriate.    [place] Siren 07/29/2020, 9:26 AM  Orbie Pyo Colvin Caroli.Ed Risk analyst [phone] Office [phone]

## 2020-07-29 NOTE — Progress Notes (Signed)
EEG completed, results pending. 

## 2020-07-29 NOTE — Progress Notes (Signed)
Brief Neuro Update:  Notified by RN about seizure and post ictal somnolence. Wakes up and follows commands.  Loaded with Keppra 2G IV once and started on maintenance Keppra 500mg  BID  Will get a STAT CTH.  [place] Pager Number [phone]

## 2020-07-29 NOTE — Progress Notes (Signed)
Dr. Lorrin Goodell made aware of seizure activity shortly after 0300. Patient oral cavity suctioned. PRN labetalol given for elevated blood pressure. Keppra and STAT head CT ordered.

## 2020-07-29 NOTE — Progress Notes (Signed)
NAME:  Jennifer Lucero, MRN:  [id number], DOB:  Feb 11, 1950, LOS: 1 ADMISSION DATE:  07/28/2020, CONSULTATION DATE:  7/6 REFERRING MD:  Dr. Leonel Ramsay, CHIEF COMPLAINT:  Right M2 occlusion   History of Present Illness:  Patient is a 70 yo F w/ PMH of A-fib on eliquis, DM2, HTN, HLD presenting to [facility] on 7/6 w/ L sided weakness which began this morning.  On 7/6 around 9am patient had L sided weakness and sudden onset rightward gaze and transported to [facility] ER. Patient had emergent CT/CTA which showed R M2 occlusion. Also showed previous L sided stroke. Tpa was not given. Transported to [facility]. Patient underwent IR thrombectomy w/ complete revascularization. Patient was extubated but is still lethargic. On simple mask 8-10 liters and breathing heavy.   Significant Hospital Events: Including procedures, antibiotic start and stop dates in addition to other pertinent events   7/6: Right M2 occlusion; admitted to [facility]: IR thrombectomy w/ complete revascularization  Interim History / Subjective:  Patient had seizure overnight, stat head CT was done which was negative She was loaded with Keppra and then continued on Keppra twice daily  Objective   Blood pressure (!) 145/45, pulse (!) 104, temperature 100 F (37.8 C), temperature source Axillary, resp. rate (!) 21, weight 98 kg, SpO2 97 %.    FiO2 (%):  [30 %] 30 %   Intake/Output Summary (Last 24 hours) at 07/29/2020 0851 Last data filed at 07/29/2020 0800 Gross per 24 hour  Intake 3216.57 ml  Output 1120 ml  Net 2096.57 ml   Filed Weights   07/29/20 0300  Weight: 98 kg    Examination: General:  Critically ill elderly bees Caucasian female; lying on the bed HEENT: MM pink/moist; on Ventimask, moist mucous membranes Neuro: Opens eyes with vocal stimuli, forced right gaze deviation, antigravity on right side, withdrawing on left lower extremity, plegic left upper extremity PULM: Reduced air entry all over, no wheezes or rhonchi GI: soft, bsx4  active  Extremities: warm/dry, no edema  Skin: no rashes or lesions  Resolved Hospital Problem list     Assessment & Plan:  Acute right MCA stroke status post thrombectomy with TICI 3 Hypertension Proximal A. Fib w/ controlled rate Stroke team is following Continue secondary stroke prophylaxis Continue neuro watch every hour SBP goal 100-1 40 Continue Cleviprex Speech and swallow evaluation pending, if she passed then we will start her on oral antihypertensives Continue as needed labetalol Continue antiplatelet Defer to neurology regarding anticoagulation for high CHA2DS2-VASc score  Acute hypoxic respiratory failure Morbid obesity Probable obstructive sleep apnea Patient was on BiPAP overnight Currently on Ventimask, will switch to nasal cannula Titrate oxygen with O2 sat goal 90-94%  Seizure episode likely in the setting of insular/periinsular stroke Patient started with generalized tonic-clonic seizure last night CT head was repeated which showed no acute changes She was loaded with Keppra, then continued with Keppra for 500 mg twice daily Continue seizure and aspiration precautions  Poorly controlled diabetes type 2 Patient's A1c is over 9 Continue Lantus and sliding scale insulin with CBG goal 140-180   Best Practice (right click and "Reselect all SmartList Selections" daily)   Diet/type: NPO, speech and swallow evaluation is pending DVT prophylaxis: SCD GI prophylaxis: N/A Lines: N/A Foley:  N/A Code Status:  full code Last date of multidisciplinary goals of care discussion [per primary]  Labs   CBC: Recent Labs  Lab 07/28/20 1515 07/28/20 1708 07/29/20 0455  WBC  --  12.6* 14.6*  NEUTROABS  --   --  10.9*  HGB 9.5* 11.0* 9.8*  HCT 28.0* 34.0* 31.5*  MCV  --  90.7 90.8  PLT  --  310 970    Basic Metabolic Panel: Recent Labs  Lab 07/28/20 1515 07/28/20 1708 07/29/20 0455  NA 144 140 142  K 4.1 5.2* 4.5  CL  --  113* 112*  CO2  --  19* 20*   GLUCOSE  --  130* 194*  BUN  --  18 20  CREATININE  --  1.21* 1.30*  CALCIUM  --  8.3* 8.2*   GFR: Estimated Creatinine Clearance: 44.5 mL/min (A) (by C-G formula based on SCr of 1.3 mg/dL (H)). Recent Labs  Lab 07/28/20 1708 07/29/20 0455  WBC 12.6* 14.6*    Liver Function Tests: Recent Labs  Lab 07/28/20 1708  AST 41  ALT 18  ALKPHOS 38  BILITOT 0.7  PROT 6.1*  ALBUMIN 3.1*   No results for input(s): LIPASE, AMYLASE in the last 168 hours. No results for input(s): AMMONIA in the last 168 hours.  ABG    Component Value Date/Time   PHART 7.253 (L) 07/28/2020 1515   PCO2ART 46.7 07/28/2020 1515   PO2ART 83 07/28/2020 1515   HCO3 20.6 07/28/2020 1515   TCO2 22 07/28/2020 1515   ACIDBASEDEF 6.0 (H) 07/28/2020 1515   O2SAT 94.0 07/28/2020 1515     Coagulation Profile: No results for input(s): INR, PROTIME in the last 168 hours.  Cardiac Enzymes: No results for input(s): CKTOTAL, CKMB, CKMBINDEX, TROPONINI in the last 168 hours.  HbA1C: Hgb A1c MFr Bld  Date/Time Value Ref Range Status  07/29/2020 04:55 AM 9.6 (H) 4.8 - 5.6 % Final    Comment:    (NOTE) Pre diabetes:          5.7%-6.4%  Diabetes:              >6.4%  Glycemic control for   <7.0% adults with diabetes   07/28/2020 05:08 PM 9.6 (H) 4.8 - 5.6 % Final    Comment:    (NOTE) Pre diabetes:          5.7%-6.4%  Diabetes:              >6.4%  Glycemic control for   <7.0% adults with diabetes     CBG: Recent Labs  Lab 07/28/20 1657 07/28/20 2025 07/28/20 2336 07/29/20 0342 07/29/20 0757  GLUCAP 135* 159* 203* 210* 140*    Total critical care time: 41 minutes  Performed by: [place] care time was exclusive of separately billable procedures and treating other patients.   Critical care was necessary to treat or prevent imminent or life-threatening deterioration.   Critical care was time spent personally by me on the following activities: development of treatment plan  with patient and/or surrogate as well as nursing, discussions with consultants, evaluation of patient's response to treatment, examination of patient, obtaining history from patient or surrogate, ordering and performing treatments and interventions, ordering and review of laboratory studies, ordering and review of radiographic studies, pulse oximetry and re-evaluation of patient's condition.   Jacky Kindle MD [facility] See Amion for pager If no response to pager, please call [phone] until 7pm After 7pm, Please call E-link [phone]

## 2020-07-29 NOTE — Progress Notes (Signed)
OT Cancellation Note  Patient Details Name: Jennifer Lucero MRN: [id number] DOB: 10/21/1950   Cancelled Treatment:    Reason Eval/Treat Not Completed: (P) Other (comment) (Pt with seizure last night adn post ictal. Will discuss with nsg and assess when medically appropriate.)  Myli Pae,HILLARY 07/29/2020, 1:41 PM Maurie Boettcher, OT/L   Acute OT Clinical Specialist [place] Pager [phone] Office [phone]

## 2020-07-29 NOTE — Anesthesia Postprocedure Evaluation (Signed)
Anesthesia Post Note  Patient: Jennifer Lucero  Procedure(s) Performed: IR WITH ANESTHESIA     Patient location during evaluation: PACU Anesthesia Type: General Level of consciousness: awake and alert Pain management: pain level controlled Vital Signs Assessment: post-procedure vital signs reviewed and stable Respiratory status: spontaneous breathing, nonlabored ventilation, respiratory function stable and patient connected to nasal cannula oxygen Cardiovascular status: blood pressure returned to baseline and stable Postop Assessment: no apparent nausea or vomiting Anesthetic complications: no   No notable events documented.  Last Vitals:  Vitals:   07/29/20 1800 07/29/20 2000  BP: (!) 149/73   Pulse: (!) 102   Resp: (!) 21   Temp:  37.5 C  SpO2: 97%     Last Pain:  Vitals:   07/29/20 2000  TempSrc: Oral                 Tiajuana Amass

## 2020-07-30 ENCOUNTER — Inpatient Hospital Stay (HOSPITAL_COMMUNITY): Payer: Medicare Other

## 2020-07-30 DIAGNOSIS — E1165 Type 2 diabetes mellitus with hyperglycemia: Secondary | ICD-10-CM

## 2020-07-30 DIAGNOSIS — J810 Acute pulmonary edema: Secondary | ICD-10-CM

## 2020-07-30 DIAGNOSIS — R1310 Dysphagia, unspecified: Secondary | ICD-10-CM

## 2020-07-30 DIAGNOSIS — I4821 Permanent atrial fibrillation: Secondary | ICD-10-CM

## 2020-07-30 DIAGNOSIS — J9601 Acute respiratory failure with hypoxia: Secondary | ICD-10-CM | POA: Diagnosis not present

## 2020-07-30 DIAGNOSIS — I63511 Cerebral infarction due to unspecified occlusion or stenosis of right middle cerebral artery: Secondary | ICD-10-CM | POA: Diagnosis not present

## 2020-07-30 LAB — URINALYSIS, COMPLETE (UACMP) WITH MICROSCOPIC
Bacteria, UA: NONE SEEN
Bilirubin Urine: NEGATIVE
Glucose, UA: NEGATIVE mg/dL
Hgb urine dipstick: NEGATIVE
Ketones, ur: 20 mg/dL — AB
Nitrite: NEGATIVE
Protein, ur: NEGATIVE mg/dL
Specific Gravity, Urine: 1.015 (ref 1.005–1.030)
pH: 5 (ref 5.0–8.0)

## 2020-07-30 LAB — BASIC METABOLIC PANEL
Anion gap: 7 (ref 5–15)
BUN: 12 mg/dL (ref 8–23)
CO2: 23 mmol/L (ref 22–32)
Calcium: 8.3 mg/dL — ABNORMAL LOW (ref 8.9–10.3)
Chloride: 110 mmol/L (ref 98–111)
Creatinine, Ser: 1.08 mg/dL — ABNORMAL HIGH (ref 0.44–1.00)
GFR, Estimated: 55 mL/min — ABNORMAL LOW (ref >60)
Glucose, Bld: 112 mg/dL — ABNORMAL HIGH (ref 70–99)
Potassium: 4.1 mmol/L (ref 3.5–5.1)
Sodium: 140 mmol/L (ref 135–145)

## 2020-07-30 LAB — CBC
HCT: 30.4 % — ABNORMAL LOW (ref 36.0–46.0)
Hemoglobin: 9.8 g/dL — ABNORMAL LOW (ref 12.0–15.0)
MCH: 28.6 pg (ref 26.0–34.0)
MCHC: 32.2 g/dL (ref 30.0–36.0)
MCV: 88.6 fL (ref 80.0–100.0)
Platelets: 278 10*3/uL (ref 150–400)
RBC: 3.43 MIL/uL — ABNORMAL LOW (ref 3.87–5.11)
RDW: 16.4 % — ABNORMAL HIGH (ref 11.5–15.5)
WBC: 12.1 10*3/uL — ABNORMAL HIGH (ref 4.0–10.5)
nRBC: 0 % (ref 0.0–0.2)

## 2020-07-30 LAB — GLUCOSE, CAPILLARY
Glucose-Capillary: 102 mg/dL — ABNORMAL HIGH (ref 70–99)
Glucose-Capillary: 110 mg/dL — ABNORMAL HIGH (ref 70–99)
Glucose-Capillary: 127 mg/dL — ABNORMAL HIGH (ref 70–99)
Glucose-Capillary: 127 mg/dL — ABNORMAL HIGH (ref 70–99)
Glucose-Capillary: 128 mg/dL — ABNORMAL HIGH (ref 70–99)
Glucose-Capillary: 98 mg/dL (ref 70–99)

## 2020-07-30 MED ORDER — PRAVASTATIN SODIUM 10 MG PO TABS
10.0000 mg | ORAL_TABLET | Freq: Every day | ORAL | Status: DC
  Filled 2020-07-30: qty 1

## 2020-07-30 MED ORDER — ADULT MULTIVITAMIN W/MINERALS CH
1.0000 | ORAL_TABLET | Freq: Every day | ORAL | Status: DC
  Administered 2020-07-30 – 2020-08-05 (×7): 1
  Filled 2020-07-30 (×7): qty 1

## 2020-07-30 MED ORDER — PROSOURCE TF PO LIQD
45.0000 mL | Freq: Every day | ORAL | Status: DC
  Administered 2020-07-31 – 2020-08-05 (×6): 45 mL
  Filled 2020-07-30 (×6): qty 45

## 2020-07-30 MED ORDER — METOPROLOL TARTRATE 25 MG PO TABS: 25.0000 mg | ORAL_TABLET | Freq: Two times a day (BID) | ORAL | Status: DC

## 2020-07-30 MED ORDER — METOPROLOL TARTRATE 25 MG/10 ML ORAL SUSPENSION
25.0000 mg | Freq: Two times a day (BID) | ORAL | Status: DC
  Administered 2020-07-30 – 2020-08-05 (×12): 25 mg
  Filled 2020-07-30 (×13): qty 10

## 2020-07-30 MED ORDER — ASPIRIN 325 MG PO TABS: 325.0000 mg | ORAL_TABLET | Freq: Every day | ORAL | Status: DC

## 2020-07-30 MED ORDER — LEVETIRACETAM 500 MG PO TABS: 500.0000 mg | ORAL_TABLET | Freq: Two times a day (BID) | ORAL | Status: DC

## 2020-07-30 MED ORDER — PROSOURCE TF PO LIQD: 45.0000 mL | Freq: Two times a day (BID) | ORAL | Status: DC

## 2020-07-30 MED ORDER — VITAL HIGH PROTEIN PO LIQD: 1000.0000 mL | ORAL | Status: DC

## 2020-07-30 MED ORDER — PRAVASTATIN SODIUM 10 MG PO TABS
10.0000 mg | ORAL_TABLET | Freq: Every day | ORAL | Status: DC
  Administered 2020-07-30 – 2020-08-05 (×7): 10 mg
  Filled 2020-07-30 (×6): qty 1

## 2020-07-30 MED ORDER — LEVETIRACETAM 100 MG/ML PO SOLN
500.0000 mg | Freq: Two times a day (BID) | ORAL | Status: DC
  Administered 2020-07-30 – 2020-08-05 (×12): 500 mg
  Filled 2020-07-30 (×12): qty 5

## 2020-07-30 MED ORDER — HEPARIN SODIUM (PORCINE) 5000 UNIT/ML IJ SOLN
5000.0000 [IU] | Freq: Three times a day (TID) | INTRAMUSCULAR | Status: DC
  Administered 2020-07-30 – 2020-08-05 (×18): 5000 [IU] via SUBCUTANEOUS
  Filled 2020-07-30 (×17): qty 1

## 2020-07-30 MED ORDER — METOPROLOL TARTRATE 25 MG/10 ML ORAL SUSPENSION
25.0000 mg | Freq: Two times a day (BID) | ORAL | Status: DC
  Filled 2020-07-30: qty 10

## 2020-07-30 MED ORDER — GLUCERNA 1.5 CAL PO LIQD
1000.0000 mL | ORAL | Status: DC
  Administered 2020-07-30 – 2020-08-05 (×7): 1000 mL
  Filled 2020-07-30 (×9): qty 1000

## 2020-07-30 NOTE — Progress Notes (Signed)
I updated her husband at bedside this morning. All questions answered.  Julian Hy, DO 07/30/20 3:02 PM [facility]

## 2020-07-30 NOTE — Progress Notes (Signed)
Speech Pathology Cognitive-language assessment   07/30/20 1047  SLP Visit Information  SLP Received On 07/30/20  SLP Time Calculation  SLP Start Time (ACUTE ONLY) 1131  SLP Stop Time (ACUTE ONLY) 1146  SLP Time Calculation (min) (ACUTE ONLY) 15 min  General Information  HPI 70 yo admitted with left sided weakness, rightward gaze. CT right insular/peri-insular acute infarct, increased subarachnoid density at the right vertex, presumably diluted subarachnoid blood. Underwent thrombectomy with complete revascularization. PMH: GERD, esophagitis, stroke 2019 (no prior ST notes), A-fib, DM2, HTN, HLD  Prior Functional Status  Cognitive/Linguistic Baseline WFL  Type of Home Mobile home   Lives With Spouse  Pain Assessment  Pain Assessment Faces  Faces Pain Scale 0  Oral Motor/Sensory Function  Overall Oral Motor/Sensory Function Severe impairment  Facial ROM  (could not follow command for oral motor)  Facial Symmetry Abnormal symmetry left;Suspected CN VII (facial) dysfunction  Cognition  Overall Cognitive Status Impaired/Different from baseline  Arousal/Alertness Awake/alert  Orientation Level Disoriented to place;Disoriented to situation (via yes/no)  Attention Sustained  Sustained Attention Impaired  Sustained Attention Impairment Functional basic  Memory  (will assess as able)  Awareness Impaired  Awareness Impairment Emergent impairment  Problem Solving Impaired  Problem Solving Impairment Functional basic  Safety/Judgment Impaired  Auditory Comprehension  Overall Auditory Comprehension Impaired  Yes/No Questions X  Basic Biographical Questions 0-25% accurate  Basic Immediate Environment Questions 0-24% accurate  Commands X  One Step Basic Commands 25-49% accurate  Interfering Components Attention;Processing speed  Visual Recognition/Discrimination  Discrimination Not tested  Reading Comprehension  Reading Status  (Will asses)  Expression  Primary Mode of Expression   (no verbalization)  Verbal Expression  Overall Verbal Expression Impaired  Initiation Impaired  Naming  (NT)  Pragmatics Impairment  Impairments Eye contact  Interfering Components Attention  Written Expression  Dominant Hand Left (now right after hand/arm amputation)  Written Expression  (TBA)  Motor Speech  Overall Motor Speech  (TBA)  Phonation  (will assess)  Intelligibility UTA  SLP - End of Session  Patient left in chair;with family/visitor present  Nurse Communication Treatment plan;Cognitive/Linguistic strategies reviewed  Assessment  Clinical Impression Statement (ACUTE ONLY) Pt exhibits significant communication-cognitive impairment. Pt is left hand dominant with an injury requiring left hand/partial forearm amputation per husband. Left CN VII impairments and has open mouth posture with lingual protrusion. She was unable to vocalize on command, during automatic speech tasks although spontaneously vocalized x 2. Followed one step directions with 40-50% accuracy when she was attending. Head gestures for biographical yes/no questions was 20%. Oral motor apraxia suspected resulting in inability to imitate labial/lingual movements. She has significant left neglect cognitive deficits notes with sustained attention and awareness. As therapy progresses will further assess language/motor speech. Suspect as a left hand hand dominant pt, may be right brain dominant for language although this is more rare (?)  SLP Recommendation/Assessment Patient needs continued Speech Lanaguage Pathology Services  SLP Visit Diagnosis Cognitive communication deficit (R41.841)  Problem List Auditory comprehension;Verbal expression;Social interaction/pragmatics  Plan  Speech Therapy Frequency (ACUTE ONLY) min 2x/week  Duration 2 weeks  Treatment/Interventions Patient/family education;Compensatory strategies;SLP instruction and feedback;Multimodal communcation approach;Functional tasks  Potential to  Achieve Goals (ACUTE ONLY)  (fair-good)  Potential Considerations (ACUTE ONLY) Severity of impairments  SLP Recommendations  Recommendations for Other Services Rehab consult  Follow up Recommendations Inpatient Rehab  SLP Equipment None recommended by SLP  Individuals Consulted  Consulted and Agree with Results and Recommendations Patient;Family member/caregiver  Family Member  Consulted  husband  SLP Goals  Progress/Goals/Alternative treatment plan discussed with pt/caregiver and they Agree  SLP Evaluations  $ SLP Speech Visit 1 Visit  SLP Evaluations  $ SLP EVAL LANGUAGE/SOUND PRODUCTION 1 Procedure  Orbie Pyo Chanci Ojala M.Ed Risk analyst [phone] Office [phone]
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
## 2020-07-30 NOTE — Progress Notes (Signed)
Rehab Admissions Coordinator Note:  Patient was screened by Cleatrice Burke for appropriateness for an Inpatient Acute Rehab Consult per therapy recs.  At this time, we are recommending Inpatient Rehab consult. I will place order per protocol.  Cleatrice Burke RN MSN 07/30/2020, 3:07 PM  I can be reached at [phone].

## 2020-07-30 NOTE — Progress Notes (Signed)
Initial Nutrition Assessment  DOCUMENTATION CODES:   Obesity unspecified  INTERVENTION:   Initiate tube feeding via Cortrak tube: Glucerna 1.5 at 50 ml/h (1200 ml per day) Prosource TF 45 ml daily  Provides 1840 kcal, 110 gm protein, 912 ml free water daily  MVI with minerals per tube; daily    NUTRITION DIAGNOSIS:   Inadequate oral intake related to inability to eat as evidenced by NPO status.  GOAL:   Patient will meet greater than or equal to 90% of their needs  MONITOR:   Diet advancement, TF tolerance  REASON FOR ASSESSMENT:   Consult Enteral/tube feeding initiation and management  ASSESSMENT:   Pt with PMH of Afib, uncontrolled DM, HTN, and HLD admitted with L sided weakness due to R MCA.   Pt discussed during ICU rounds and with RN.  Husband at bedside and provides hx. Reports no recent weight changes with usual weight around 210-215 lb. Reports good appetite PTA.   7/6 s/p IR thrombectomy with complete revascularization.  7/8 cortrak placed; tip gastric    Medications reviewed and include: SSI, lantus  Labs reviewed    NUTRITION - FOCUSED PHYSICAL EXAM:  Flowsheet Row Most Recent Value  Orbital Region No depletion  Upper Arm Region No depletion  Thoracic and Lumbar Region No depletion  Buccal Region No depletion  Temple Region No depletion  Clavicle Bone Region No depletion  Clavicle and Acromion Bone Region No depletion  Scapular Bone Region No depletion  Dorsal Hand No depletion  Patellar Region No depletion  Anterior Thigh Region No depletion  Posterior Calf Region No depletion  Edema (RD Assessment) Mild  Hair Reviewed  Eyes Unable to assess  Mouth Unable to assess  Skin Reviewed  [very dry and flaky]  Nails Reviewed       Diet Order:   Diet Order             Diet NPO time specified  Diet effective now                   EDUCATION NEEDS:   Not appropriate for education at this time  Skin:  Skin Assessment: Reviewed  RN Assessment  Last BM:  7/7 large  Height:   Ht Readings from Last 1 Encounters:  05/12/20 5\' 4"  (1.626 m)    Weight:   Wt Readings from Last 1 Encounters:  07/29/20 98 kg    Ideal Body Weight:     BMI:  Body mass index is 37.09 kg/m.  Estimated Nutritional Needs:   Kcal:  1800-2000  Protein:  100-115 grams  Fluid:  >1.8 L/day  Lockie Pares., RD, LDN, CNSC See AMiON for contact information

## 2020-07-30 NOTE — Progress Notes (Signed)
NAME:  Jennifer Lucero, MRN:  [id number], DOB:  10-May-1950, LOS: 2 ADMISSION DATE:  07/28/2020, CONSULTATION DATE:  7/6 REFERRING MD:  Dr. Leonel Ramsay, CHIEF COMPLAINT:  Right M2 occlusion   History of Present Illness:  Patient is a 70 yo F w/ PMH of A-fib on eliquis, DM2, HTN, HLD presenting to [facility] on 7/6 w/ L sided weakness which began this morning.  On 7/6 around 9am patient had L sided weakness and sudden onset rightward gaze and transported to [facility] ER. Patient had emergent CT/CTA which showed R M2 occlusion. Also showed previous L sided stroke. Tpa was not given. Transported to [facility]. Patient underwent IR thrombectomy w/ complete revascularization. Patient was extubated but is still lethargic. On simple mask 8-10 liters and breathing heavy.   Significant Hospital Events: Including procedures, antibiotic start and stop dates in addition to other pertinent events   7/6: Right M2 occlusion; admitted to [facility]: IR thrombectomy w/ complete revascularization  Interim History / Subjective:  She remains extubated. She is non-verbal but indicates that her throat is sore.  Objective   Blood pressure (!) 142/67, pulse 89, temperature 99 F (37.2 C), temperature source Oral, resp. rate (!) 22, weight 98 kg, SpO2 97 %.        Intake/Output Summary (Last 24 hours) at 07/30/2020 0801 Last data filed at 07/30/2020 0700 Gross per 24 hour  Intake 544.23 ml  Output 1200 ml  Net -655.77 ml    Filed Weights   07/29/20 0300  Weight: 98 kg    Examination: General:  chronically ill appearing woman lying in bed in NAD HEENT: [place]/AT, eyes anicteric Neuro: sleeping but arouses to verbal stimulation. Moves R side to commands, not moving left side. Not answering questions or attempting to talk, but trying to spell with her finger in the air. Rattle in her throat, not effective throat clearing or coordinated cough--- when asked to cough she exhales hard only. PULM: mild tachypnea, no accessory muscle use.  Faint basilar rhales. GI: soft, NT Extremities: improving edema, no cyanosis. Amputated left hand. Skin: warm, dry, no rashes  Cxr personally reviewed> increased interstitial markings bilaterally, most notable in bases. Left hemidiaphragm silhouetted.  CT head: encephalomalacia of L MCA territory. Possible small volume subarachnoid blood.  MRI brain: possible subarachnoid blood at R vertex. Acute infarction of R insula   Resolved Hospital Problem list     Assessment & Plan:  Acute right MCA stroke status post thrombectomy with TICI 3, possible small volume of R ICH blood Aphasia, Dysphagia, dense L hemiparesis -SLP, OT, PT -Needs a cortrak -High risk for reintubation due to inability to control secretions. If she requires reintubation, suspect she will need trach as this is unlikely to resolve quickly. -hold systemic AC at this point given risk of hemorrhagic conversion -start pravastatin once able to take PO  Seizure post-CVA -con't keppra -seizure precautions   Hypertension Paroxysmal A. Fib w/ controlled rate -Stroke team is following; discussed with Dr. Erlinda Hong today -Continue secondary stroke prophylaxis. Starting aspirin today. -Continue frequent neuro monitoring -SBP goal <150 -Continue as needed labetalol -Defer to neurology regarding timeline of restarting anticoagulation; on Eliquis PTA.  Acute hypoxic respiratory failure, likely acute pulmonary edema and aspiration  Morbid obesity Probable obstructive sleep apnea -wean O2 as able; goal SpOP >90% -Holding diuresis for now as PO intake is limited -Close monitoring of swallow status; she is high risk for reintubation based on poor cough mechanics and poor throat clearing. -Pulmonary hygiene as able  Uncontrolled diabetes type 2- A1c 9.6 -Continue Lantus  -SSI PRN -goal BG 140-180 -needs TF started on cortrak placed  Acute anemia present on admission -likely due to chronic illness -transfuse for Hb<7 or  hemodynamically significant bleeding  Will update family this afternoon at bedside.  Best Practice (right click and "Reselect all SmartList Selections" daily)   Diet/type: NPO, speech and swallow evaluation is pending DVT prophylaxis: SCD GI prophylaxis: N/A Lines: N/A Foley:  N/A Code Status:  full code Last date of multidisciplinary goals of care discussion [ ]   Labs   CBC: Recent Labs  Lab 07/28/20 1515 07/28/20 1708 07/29/20 0455 07/30/20 0512  WBC  --  12.6* 14.6* 12.1*  NEUTROABS  --   --  10.9*  --   HGB 9.5* 11.0* 9.8* 9.8*  HCT 28.0* 34.0* 31.5* 30.4*  MCV  --  90.7 90.8 88.6  PLT  --  310 332 278     Basic Metabolic Panel: Recent Labs  Lab 07/28/20 1515 07/28/20 1708 07/29/20 0455 07/30/20 0512  NA 144 140 142 140  K 4.1 5.2* 4.5 4.1  CL  --  113* 112* 110  CO2  --  19* 20* 23  GLUCOSE  --  130* 194* 112*  BUN  --  18 20 12   CREATININE  --  1.21* 1.30* 1.08*  CALCIUM  --  8.3* 8.2* 8.3*    GFR: Estimated Creatinine Clearance: 53.5 mL/min (A) (by C-G formula based on SCr of 1.08 mg/dL (H)). Recent Labs  Lab 07/28/20 1708 07/29/20 0455 07/30/20 0512  WBC 12.6* 14.6* 12.1*     Julian Hy, DO 07/30/20 9:25 AM [facility]

## 2020-07-30 NOTE — Evaluation (Signed)
Occupational Therapy Evaluation Patient Details Name: Jennifer Lucero MRN: [id number] DOB: [birth year] Today's Date: 07/30/2020    History of Present Illness Jennifer Lucero is a 70 y.o. female who presented with left-sided weakness that started abruptly and sudden onset of rightward gaze.CT/CT angiogram demonstrated right M2 occlusion and previous left-sided stroke. MRI: Evolving acute to early subacute right MCA distribution infarct.  Pt s/p RT common carotid arteriogram followed by complete revascularization of occluded RT MCA. PHMx: atrial fibrillation, diabetes, hypertension, hyperlipidemia, LUE residual limb wrist distally.   Clinical Impression   This 70 yo female admitted with above presents to acute OT with PLOF of being totally independent with basic ADLs. Currently she is total A for all basic ADLs with Mod-min A +2 for all mobility. She follows 50% of one step commands with increase to 75% with commands and gestures. She will continue to benefit from acute OT with follow up on CIR.    Follow Up Recommendations  CIR;Supervision/Assistance - 24 hour    Equipment Recommendations  Other (comment) (TBD next venue)       Precautions / Restrictions Precautions Precautions: Fall Precaution Comments: non-verbal since stroke Restrictions Weight Bearing Restrictions: No      Mobility Bed Mobility Overal bed mobility: Needs Assistance Bed Mobility: Supine to Sit     Supine to sit: Mod assist;+2 for physical assistance;HOB elevated     General bed mobility comments: A'd some with trunk, initiated RLE off of bed, bed pad used to turn pt all the way around and initiate scoot to EOB--then she started to A to scoot    Transfers Overall transfer level: Needs assistance Equipment used: 2 person hand held assist Transfers: Sit to/from Stand Sit to Stand: Min assist;+2 physical assistance         General transfer comment: Ambulation +2 HHA min A    Balance Overall balance  assessment: Needs assistance Sitting-balance support: No upper extremity supported;Feet supported Sitting balance-Leahy Scale: Fair     Standing balance support: Bilateral upper extremity supported Standing balance-Leahy Scale: Poor                             ADL either performed or assessed with clinical judgement   ADL Overall ADL's : Needs assistance/impaired Eating/Feeding: NPO Eating/Feeding Details (indicate cue type and reason): total A Grooming: Sitting Grooming Details (indicate cue type and reason): total A to wash face Upper Body Bathing: Total assistance;Sitting   Lower Body Bathing: Total assistance Lower Body Bathing Details (indicate cue type and reason): Min A +2 sit<>stand Upper Body Dressing : Total assistance;Sitting   Lower Body Dressing: Total assistance Lower Body Dressing Details (indicate cue type and reason): Min A +2 sit<>stand Toilet Transfer: Minimal assistance;+2 for physical assistance;Ambulation Toilet Transfer Details (indicate cue type and reason): Bil HHA Toileting- Clothing Manipulation and Hygiene: Total assistance Toileting - Clothing Manipulation Details (indicate cue type and reason): Min A +2 sit<>stand             Vision   Additional Comments: left neglect v. inattention            Pertinent Vitals/Pain Pain Assessment: No/denies pain Faces Pain Scale: No hurt     Hand Dominance Right   Extremity/Trunk Assessment Upper Extremity Assessment Upper Extremity Assessment: LUE deficits/detail LUE Deficits / Details: residual limb (has prothesis but husband reports she does not usually use it), tightness, neglect LUE Coordination: decreased gross motor  Communication Communication Communication: No difficulties   Cognition Arousal/Alertness: Awake/alert Behavior During Therapy: Flat affect Overall Cognitive Status: Impaired/Different from baseline Area of Impairment: Safety/judgement;Attention                    Current Attention Level: Sustained     Safety/Judgement: Decreased awareness of safety;Decreased awareness of deficits     General Comments: Pt non-verbal. She follows 1-step commands ~50% of time and 75% of time with added gestures, left neglect              Home Living Family/patient expects to be discharged to:: Private residence Living Arrangements: Spouse/significant other Available Help at Discharge: Family;Available 24 hours/day Type of Home: Mobile home Home Access: Stairs to enter Entrance Stairs-Number of Steps: 4 Entrance Stairs-Rails: Right;Left;Can reach both Home Layout: One level     Bathroom Shower/Tub: Tub/shower unit;Walk-in shower   Bathroom Toilet: Handicapped height     Home Equipment: Cane - quad;Cane - single point;Shower seat      Lives With: Spouse    Prior Functioning/Environment Level of Independence: Independent        Comments: except husband helped with insulin        OT Problem List: Decreased strength;Decreased range of motion;Impaired balance (sitting and/or standing);Impaired vision/perception;Impaired sensation;Decreased coordination;Decreased cognition;Decreased safety awareness;Impaired UE functional use      OT Treatment/Interventions: Self-care/ADL training;DME and/or AE instruction;Patient/family education;Balance training    OT Goals(Current goals can be found in the care plan section) Acute Rehab OT Goals Patient Stated Goal: unable to state (non-verbal) OT Goal Formulation: With family Time For Goal Achievement: 08/13/20 Potential to Achieve Goals: Good  OT Frequency: Min 2X/week           Co-evaluation PT/OT/SLP Co-Evaluation/Treatment: Yes Reason for Co-Treatment: Necessary to address cognition/behavior during functional activity;For patient/therapist safety;To address functional/ADL transfers PT goals addressed during session: Mobility/safety with mobility;Balance;Strengthening/ROM OT  goals addressed during session: Strengthening/ROM;ADL's and self-care      AM-PAC OT "6 Clicks" Daily Activity     Outcome Measure Help from another person eating meals?: Total Help from another person taking care of personal grooming?: Total Help from another person toileting, which includes using toliet, bedpan, or urinal?: Total Help from another person bathing (including washing, rinsing, drying)?: Total Help from another person to put on and taking off regular upper body clothing?: Total Help from another person to put on and taking off regular lower body clothing?: Total 6 Click Score: 6   End of Session Equipment Utilized During Treatment: Gait belt Nurse Communication: Mobility status (does not need lift pad in chair)  Activity Tolerance: Patient tolerated treatment well Patient left: in chair;with call bell/phone within reach;with chair alarm set  OT Visit Diagnosis: Unsteadiness on feet (R26.81);Other abnormalities of gait and mobility (R26.89);Muscle weakness (generalized) (M62.81);Other symptoms and signs involving cognitive function;Cognitive communication deficit (R41.841) Symptoms and signs involving cognitive functions: Nontraumatic intracerebral hemorrhage                Time: 3845-3646 OT Time Calculation (min): 26 min Charges:  OT General Charges $OT Visit: 1 Visit OT Evaluation $OT Eval Moderate Complexity: 1 Mod  [address], OTR/L Acute [employer] Pager [phone] Office [phone]    Almon Register 07/30/2020, 1:34 PM

## 2020-07-30 NOTE — Evaluation (Signed)
Physical Therapy Evaluation Patient Details Name: Jennifer Lucero MRN: [id number] DOB: 1950-11-03 Today's Date: 07/30/2020   History of Present Illness  Jennifer Lucero is a 70 y.o. female who presented 7/6 with left-sided weakness that started abruptly and sudden onset of rightward gaze.CT/CT angiogram demonstrated right M2 occlusion and previous left-sided stroke. MRI: Evolving acute to early subacute right MCA distribution infarct.  Pt s/p RT common carotid arteriogram followed by complete revascularization of occluded RT MCA. PHMx: atrial fibrillation, diabetes, hypertension, hyperlipidemia, LUE residual limb wrist distally.   Clinical Impression  Pt presents with condition above and deficits mentioned below, see PT Problem List. PTA, she was independent, living with her husband in a mobile home with 4 STE and bil handrails. Currently, pt is displaying deficits in L-sided strength, coordination, L-sided attention, balance, activity tolerance, cognition, and communication that impact her safety and independence with all functional mobility. Pt requiring bil UE support and minAx2 for transfers and short gait bouts in the room. Will continue to follow acutely. Pt would greatly benefit from intensive therapy in the CIR setting to maximize her safety and independence with all functional mobility prior to return home.    Follow Up Recommendations CIR;Supervision/Assistance - 24 hour    Equipment Recommendations  Other (comment) (TBD)    Recommendations for Other Services Rehab consult     Precautions / Restrictions Precautions Precautions: Fall Precaution Comments: non-verbal since stroke, L below elbow amputation hx Restrictions Weight Bearing Restrictions: No      Mobility  Bed Mobility Overal bed mobility: Needs Assistance Bed Mobility: Supine to Sit     Supine to sit: Mod assist;+2 for physical assistance;HOB elevated     General bed mobility comments: Assistance with trunk,  initiated RLE off of bed, bed pad used to turn pt all the way around and initiate scoot to EOB--then she started to A to scoot    Transfers Overall transfer level: Needs assistance Equipment used: 2 person hand held assist Transfers: Sit to/from Stand Sit to Stand: Min assist;+2 physical assistance         General transfer comment: Pt able to transfer to stand 1x from EOB and 1x from recliner with minA +2 bil HHA.  Ambulation/Gait Ambulation/Gait assistance: Min assist;+2 physical assistance;+2 safety/equipment Gait Distance (Feet): 10 Feet (x2 bouts of ~6 ft > ~10 ft) Assistive device: 2 person hand held assist Gait Pattern/deviations: Step-through pattern;Decreased stride length Gait velocity: reduced Gait velocity interpretation: <1.31 ft/sec, indicative of household ambulator General Gait Details: Pt with slow, unsteady gait, displaying decreased bil step length. Bil HHA and minAx2 to guide pt with steps anterior and posterior.  Stairs            Wheelchair Mobility    Modified Rankin (Stroke Patients Only) Modified Rankin (Stroke Patients Only) Pre-Morbid Rankin Score: No symptoms Modified Rankin: Moderately severe disability     Balance Overall balance assessment: Needs assistance Sitting-balance support: No upper extremity supported;Feet supported Sitting balance-Leahy Scale: Fair     Standing balance support: Bilateral upper extremity supported Standing balance-Leahy Scale: Poor                               Pertinent Vitals/Pain Pain Assessment: Faces Faces Pain Scale: No hurt Pain Intervention(s): Monitored during session    Home Living Family/patient expects to be discharged to:: Private residence Living Arrangements: Spouse/significant other Available Help at Discharge: Family;Available 24 hours/day Type of Home: Mobile home Home  Access: Stairs to enter Entrance Stairs-Rails: Right;Left;Can reach both Entrance Stairs-Number of  Steps: 4 Home Layout: One level Home Equipment: Cane - quad;Cane - single point;Shower seat      Prior Function Level of Independence: Independent         Comments: except husband helped with insulin     Hand Dominance   Dominant Hand: Right    Extremity/Trunk Assessment   Upper Extremity Assessment Upper Extremity Assessment: Defer to OT evaluation LUE Deficits / Details: residual limb (has prothesis but husband reports she does not usually use it), tightness, neglect LUE Coordination: decreased gross motor    Lower Extremity Assessment Lower Extremity Assessment: LLE deficits/detail LLE Deficits / Details: Decreased strength noted with mobility; slow initiation, coordination deficits LLE Coordination: decreased gross motor       Communication   Communication: No difficulties  Cognition Arousal/Alertness: Awake/alert Behavior During Therapy: Flat affect Overall Cognitive Status: Impaired/Different from baseline Area of Impairment: Safety/judgement;Attention;Following commands;Awareness;Memory;Problem solving                   Current Attention Level: Sustained Memory: Decreased short-term memory Following Commands: Follows multi-step commands inconsistently;Follows one step commands inconsistently;Follows one step commands with increased time Safety/Judgement: Decreased awareness of safety;Decreased awareness of deficits Awareness: Intellectual Problem Solving: Slow processing;Decreased initiation;Requires verbal cues General Comments: Pt non-verbal. She follows 1-step commands ~50% of time and 75% of time with added gestures, left neglect. Pt educated multiple times to turn head to L and attend to L side, but poor carryover.      General Comments General comments (skin integrity, edema, etc.): Tends to keep neck rotated to R, with poor ability to keep eyes turned to L    Exercises     Assessment/Plan    PT Assessment Patient needs continued PT  services  PT Problem List Decreased strength;Decreased range of motion;Decreased activity tolerance;Decreased balance;Decreased mobility;Decreased coordination;Decreased cognition;Decreased knowledge of use of DME;Decreased safety awareness       PT Treatment Interventions DME instruction;Gait training;Stair training;Functional mobility training;Therapeutic activities;Therapeutic exercise;Balance training;Neuromuscular re-education;Cognitive remediation;Patient/family education    PT Goals (Current goals can be found in the Care Plan section)  Acute Rehab PT Goals Patient Stated Goal: unable to state (non-verbal) PT Goal Formulation: With patient/family Time For Goal Achievement: 08/13/20 Potential to Achieve Goals: Good    Frequency Min 4X/week   Barriers to discharge        Co-evaluation PT/OT/SLP Co-Evaluation/Treatment: Yes Reason for Co-Treatment: Necessary to address cognition/behavior during functional activity;For patient/therapist safety;To address functional/ADL transfers PT goals addressed during session: Mobility/safety with mobility;Balance OT goals addressed during session: Strengthening/ROM;ADL's and self-care       AM-PAC PT "6 Clicks" Mobility  Outcome Measure Help needed turning from your back to your side while in a flat bed without using bedrails?: Total Help needed moving from lying on your back to sitting on the side of a flat bed without using bedrails?: Total Help needed moving to and from a bed to a chair (including a wheelchair)?: Total Help needed standing up from a chair using your arms (e.g., wheelchair or bedside chair)?: Total Help needed to walk in hospital room?: Total Help needed climbing 3-5 steps with a railing? : Total 6 Click Score: 6    End of Session Equipment Utilized During Treatment: Gait belt Activity Tolerance: Patient tolerated treatment well Patient left: in chair;with call bell/phone within reach;with chair alarm set;with  family/visitor present Nurse Communication: Mobility status PT Visit Diagnosis: Unsteadiness on feet (R26.81);Other abnormalities  of gait and mobility (R26.89);Muscle weakness (generalized) (M62.81);Difficulty in walking, not elsewhere classified (R26.2);Other symptoms and signs involving the nervous system (R29.898);Hemiplegia and hemiparesis Hemiplegia - Right/Left: Left Hemiplegia - dominant/non-dominant: Non-dominant Hemiplegia - caused by: Cerebral infarction    Time: 7672-0947 PT Time Calculation (min) (ACUTE ONLY): 26 min   Charges:   PT Evaluation $PT Eval Moderate Complexity: 1 Mod          Moishe Spice, PT, DPT Acute Rehabilitation Services  Pager: [phone] Office: [phone]   Orvan Falconer 07/30/2020, 2:54 PM

## 2020-07-30 NOTE — Procedures (Signed)
Cortrak  Person Inserting Tube:  Sherronda Sweigert, RD Tube Type:  Cortrak - 43 inches Tube Size:  10 Tube Location:  Left nare Initial Placement:  Stomach Secured by: Bridle Technique Used to Measure Tube Placement:  Marking at nare/corner of mouth Cortrak Secured At:  60 cm  Cortrak Tube Team Note:  Consult received to place a Cortrak feeding tube.   X-ray is required, abdominal x-ray has been ordered by the Cortrak team. Please confirm tube placement before using the Cortrak tube.   If the tube becomes dislodged please keep the tube and contact the Cortrak team at [email or web address] (password [email or web address]) for replacement.  If after hours and replacement cannot be delayed, place a NG tube and confirm placement with an abdominal x-ray.   Mariana Single MS, RD, LDN, CNSC Clinical Nutrition Pager listed in [employer]

## 2020-07-30 NOTE — Progress Notes (Signed)
STROKE TEAM PROGRESS NOTE   INTERVAL HISTORY No family is at the bedside.  She is sitting in bed, eyes open, nonverbal but following commands on the right hand and foot. Left arm no drift and still has mild left leg weakness. She continues to have difficulty coughing or clearing secretions. Per Dr. Carlis Abbott, high risk for re-intubation. UA neg and CXR no pneumonia. Cre and WBC improving.   Vitals:   07/30/20 0500 07/30/20 0600 07/30/20 0700 07/30/20 0800  BP: 140/70 (!) 142/78 (!) 142/67   Pulse: 86 78 89   Resp: (!) 22 (!) 21 (!) 22   Temp:    97.9 F (36.6 C)  TempSrc:    Axillary  SpO2: 98% 99% 97%   Weight:       CBC:  Recent Labs  Lab 07/29/20 0455 07/30/20 0512  WBC 14.6* 12.1*  NEUTROABS 10.9*  --   HGB 9.8* 9.8*  HCT 31.5* 30.4*  MCV 90.8 88.6  PLT 332 749   Basic Metabolic Panel:  Recent Labs  Lab 07/29/20 0455 07/30/20 0512  NA 142 140  K 4.5 4.1  CL 112* 110  CO2 20* 23  GLUCOSE 194* 112*  BUN 20 12  CREATININE 1.30* 1.08*  CALCIUM 8.2* 8.3*   Lipid Panel:  Recent Labs  Lab 07/29/20 0455  CHOL 129  TRIG 84  HDL 47  CHOLHDL 2.7  VLDL 17  LDLCALC 65   HgbA1c:  Recent Labs  Lab 07/29/20 0455  HGBA1C 9.6*   Urine Drug Screen: No results for input(s): LABOPIA, COCAINSCRNUR, LABBENZ, AMPHETMU, THCU, LABBARB in the last 168 hours.  Alcohol Level No results for input(s): ETH in the last 168 hours.  IMAGING past 24 hours CT HEAD WO CONTRAST  Result Date: 07/29/2020 IMPRESSION:  1. Right insular and peri-insular acute infarct.  2. Increased subarachnoid density at the right vertex, presumably diluted subarachnoid blood.   CT HEAD WO CONTRAST  Result Date: 07/28/2020 IMPRESSION:  1. Expected evolution of right MCA territory infarct without hemorrhagic conversion or mass effect.  2. Old left MCA infarct.   PHYSICAL EXAM  Temp:  [97.9 F (36.6 C)-99.9 F (37.7 C)] 97.9 F (36.6 C) (07/08 0800) Pulse Rate:  [76-114] 89 (07/08 0700) Resp:   [18-36] 22 (07/08 0700) BP: (125-151)/(61-86) 142/67 (07/08 0700) SpO2:  [92 %-100 %] 97 % (07/08 0700) Arterial Line BP: (108-126)/(101-117) 126/117 (07/07 1300)  General - Well nourished, well developed, lethargic with mild tachypnea.  Ophthalmologic - fundi not visualized due to noncooperation.  Cardiovascular - irregularly irregular heart rate and rhythm, on tele sinus arrhythmia but not in afib.  Neuro - awake, alert, lethargic but eyes open, nonverbal not answer questions, ? anarthria, following simple commands on right hand and foot, but not central midline comments. Not able to name or repeat. Eyes tracking bilaterally, no gaze palsy, blinking to visual threat on the right but not consistent on the left, PERRL. Left facial droop. Tongue protrusion not cooperative. Bilateral UEs 4/5, no drift, left UE below elbow amputation. BLE proximal 3-/5, and distal RLE 4/5 and LLE 3/5. Sensation not cooperative, R FTN intact although slow, gait not tested.    ASSESSMENT/PLAN [name] is a 70 y.o. female with history of A-fib on eliquis, DM2, HTN, HLD presenting with left sided weakness and rightward gaze preference.  CT head showed acute right MCA territory infarct.  CTA showed right M2 occlusion.  Now s/p thrombectomy with complete revascularization TICI 3.   Stroke -  right MCA stroke secondary to right M2 occlusion s/p mechanical thrombectomy with TICI3 reperfusion in the setting of afib on Eliquis CT head acute right MCA stroke and old left MCA infarct.  CTA head & neck: right M2 occlusion S/p IR with TICI3 Repeat CT head: Right insular and peri-insular acute infarct. Increased subarachnoid density at the right vertex. MRI  large right MCA infarct with petechial HT. Old left frontal and cerebellar infarcts 2D Echo EF 55-60% LDL 65 HgbA1c 9.6 VTE prophylaxis - SCDs aspirin 81 mg daily and Eliquis (apixaban) daily prior to admission, now on ASA 325mg . Consider to resume eliquis in  10-14 days given large infarct with HT.  Therapy recommendations: CIR Disposition:  pending  Seizures Seizure activity at approximately 3 am 7/7 Loaded with Keppra 2 grams IV EEG no seizure but cortical dysfunction at right hemisphere, moderate diffuse encephalopathy Maintenance Keppra 500 mg bid Monitor seizure activity  Afib On eliquis PTA Now eliquis on hold given large right MCA Infarct with HT On ASA 325 now Consider to resume elqiuis in 10-14 days  Respiratory difficulty Extubated but difficulty controlling secretions Per Dr. Carlis Abbott CCM, pt high risk of re-intubation Close monitoring in ICU  Hypertension Home meds:  imdur 120mg  daily, lisinopril 20mg  daily, toprol XL 50mg  daily Stable now, off cleveprix Resume metoprolol  SBP goal changed to <160 given HT Hydralazine and labetalol prn Long-term BP goal normotensive  Hyperlipidemia Home meds:  Pravastatin 10mg  daily, resumed pravastatin LDL 65, goal < 70  Continue statin at discharge  Diabetes type II Uncontrolled Home meds:  levemir, metformin 500mg  bid, glimepiride 4mg  daily HgbA1c 9.6, goal < 7.0 CBGs SSI Now on lantus PCP follow up closely  Other Stroke Risk Factors Advanced Age >/= 65  Remote ETOH use and drug use Obesity, Body mass index is 37.09 kg/m., BMI >/= 30 associated with increased stroke risk, recommend weight loss, diet and exercise as appropriate  Hx stroke: left MCA infact, May 2019  Other Active Problems Diabetic neuropathy  GERD: nexium 40mg  daily Hx of pulmonary embolus (11/01/2017) Splenic infarction anxiety  Hospital day # 2  This patient is critically ill due to large right MCA infarct, hemorrhagic conversion, afib, seizure, respiratory distress and at significant risk of neurological worsening, death form recurrent stroke, cerebral edema, heart failure, status epilepticus. This patient's care requires constant monitoring of vital signs, hemodynamics, respiratory and cardiac  monitoring, review of multiple databases, neurological assessment, discussion with family, other specialists and medical decision making of high complexity. I spent 35 minutes of neurocritical care time in the care of this patient. I discussed with Dr. Carlis Abbott CCM  Rosalin Hawking, MD PhD Stroke Neurology 07/30/2020 4:57 PM  To contact Stroke Continuity provider, please refer to [email or web address] After hours, contact General Neurology

## 2020-07-31 DIAGNOSIS — R0603 Acute respiratory distress: Secondary | ICD-10-CM

## 2020-07-31 DIAGNOSIS — I63511 Cerebral infarction due to unspecified occlusion or stenosis of right middle cerebral artery: Secondary | ICD-10-CM | POA: Diagnosis not present

## 2020-07-31 DIAGNOSIS — I6601 Occlusion and stenosis of right middle cerebral artery: Secondary | ICD-10-CM | POA: Diagnosis not present

## 2020-07-31 DIAGNOSIS — R1310 Dysphagia, unspecified: Secondary | ICD-10-CM | POA: Diagnosis not present

## 2020-07-31 DIAGNOSIS — D72829 Elevated white blood cell count, unspecified: Secondary | ICD-10-CM | POA: Diagnosis not present

## 2020-07-31 DIAGNOSIS — I482 Chronic atrial fibrillation, unspecified: Secondary | ICD-10-CM

## 2020-07-31 DIAGNOSIS — J9601 Acute respiratory failure with hypoxia: Secondary | ICD-10-CM | POA: Diagnosis not present

## 2020-07-31 LAB — PHOSPHORUS: Phosphorus: 3.6 mg/dL (ref 2.5–4.6)

## 2020-07-31 LAB — GLUCOSE, CAPILLARY
Glucose-Capillary: 137 mg/dL — ABNORMAL HIGH (ref 70–99)
Glucose-Capillary: 185 mg/dL — ABNORMAL HIGH (ref 70–99)
Glucose-Capillary: 232 mg/dL — ABNORMAL HIGH (ref 70–99)
Glucose-Capillary: 120 mg/dL — ABNORMAL HIGH (ref 70–99)
Glucose-Capillary: 150 mg/dL — ABNORMAL HIGH (ref 70–99)

## 2020-07-31 LAB — MAGNESIUM: Magnesium: 1.5 mg/dL — ABNORMAL LOW (ref 1.7–2.4)

## 2020-07-31 LAB — BASIC METABOLIC PANEL
Anion gap: 6 (ref 5–15)
BUN: 17 mg/dL (ref 8–23)
CO2: 26 mmol/L (ref 22–32)
Calcium: 8.8 mg/dL — ABNORMAL LOW (ref 8.9–10.3)
Chloride: 106 mmol/L (ref 98–111)
Creatinine, Ser: 1.04 mg/dL — ABNORMAL HIGH (ref 0.44–1.00)
GFR, Estimated: 57 mL/min — ABNORMAL LOW (ref >60)
Glucose, Bld: 151 mg/dL — ABNORMAL HIGH (ref 70–99)
Potassium: 4.3 mmol/L (ref 3.5–5.1)
Sodium: 138 mmol/L (ref 135–145)

## 2020-07-31 LAB — CBC
HCT: 30.8 % — ABNORMAL LOW (ref 36.0–46.0)
Hemoglobin: 9.8 g/dL — ABNORMAL LOW (ref 12.0–15.0)
MCH: 28.3 pg (ref 26.0–34.0)
MCHC: 31.8 g/dL (ref 30.0–36.0)
MCV: 89 fL (ref 80.0–100.0)
Platelets: 289 10*3/uL (ref 150–400)
RBC: 3.46 MIL/uL — ABNORMAL LOW (ref 3.87–5.11)
RDW: 15.6 % — ABNORMAL HIGH (ref 11.5–15.5)
WBC: 12.6 10*3/uL — ABNORMAL HIGH (ref 4.0–10.5)
nRBC: 0 % (ref 0.0–0.2)

## 2020-07-31 LAB — TROPONIN I (HIGH SENSITIVITY): Troponin I (High Sensitivity): 13 ng/L (ref <18)

## 2020-07-31 MED ORDER — ASPIRIN 325 MG PO TABS
325.0000 mg | ORAL_TABLET | Freq: Every day | ORAL | Status: DC
  Administered 2020-07-31 – 2020-08-05 (×6): 325 mg
  Filled 2020-07-31 (×7): qty 1

## 2020-07-31 MED ORDER — INSULIN ASPART 100 UNIT/ML IJ SOLN
2.0000 [IU] | INTRAMUSCULAR | Status: DC
  Administered 2020-07-31 – 2020-08-05 (×30): 2 [IU] via SUBCUTANEOUS

## 2020-07-31 MED ORDER — MAGNESIUM SULFATE 2 GM/50ML IV SOLN
2.0000 g | Freq: Once | INTRAVENOUS | Status: AC
  Administered 2020-07-31: 2 g via INTRAVENOUS
  Filled 2020-07-31: qty 50

## 2020-07-31 MED ORDER — LISINOPRIL 20 MG PO TABS
20.0000 mg | ORAL_TABLET | Freq: Every day | ORAL | Status: DC
  Administered 2020-08-01 – 2020-08-05 (×5): 20 mg
  Filled 2020-07-31 (×5): qty 1

## 2020-07-31 MED ORDER — LISINOPRIL 20 MG PO TABS
20.0000 mg | ORAL_TABLET | Freq: Every day | ORAL | Status: DC
  Filled 2020-07-31: qty 1

## 2020-07-31 NOTE — Progress Notes (Signed)
Patient complaining of chest pain at 0600. [place] notified. EKG obtained and placed in chart. Will continue to monitor.

## 2020-07-31 NOTE — Progress Notes (Signed)
Inpatient Rehab Admissions Coordinator:    Pt. Lethargic, unable to participate in conversation regarding potential CIR admit. I will reach out to family to discuss.  Clemens Catholic, [place], [place] Admissions Coordinator  [phone] ([place]) [phone] (office)

## 2020-07-31 NOTE — Progress Notes (Addendum)
STROKE TEAM PROGRESS NOTE   INTERVAL HISTORY RN is at the bedside.  She is lying in bed, eyes open, awake alert, stable respiratory condition overnight, no need for reintubation.  Dr. Carlis Abbott has talked with patient husband, who is in agreement of reintubation if needed.  Vitals:   07/31/20 0700 07/31/20 0800 07/31/20 0900 07/31/20 1000  BP: (!) 155/78 (!) 158/80 (!) 160/78 (!) 164/82  Pulse: 80  72 71  Resp: (!) 23 (!) 21 (!) 24 (!) 22  Temp:  99 F (37.2 C)    TempSrc:  Axillary    SpO2: 99%  96% 99%  Weight:       CBC:  Recent Labs  Lab 07/29/20 0455 07/30/20 0512 07/31/20 0259  WBC 14.6* 12.1* 12.6*  NEUTROABS 10.9*  --   --   HGB 9.8* 9.8* 9.8*  HCT 31.5* 30.4* 30.8*  MCV 90.8 88.6 89.0  PLT 332 278 379   Basic Metabolic Panel:  Recent Labs  Lab 07/30/20 0512 07/31/20 0259  NA 140 138  K 4.1 4.3  CL 110 106  CO2 23 26  GLUCOSE 112* 151*  BUN 12 17  CREATININE 1.08* 1.04*  CALCIUM 8.3* 8.8*  MG  --  1.5*  PHOS  --  3.6   Lipid Panel:  Recent Labs  Lab 07/29/20 0455  CHOL 129  TRIG 84  HDL 47  CHOLHDL 2.7  VLDL 17  LDLCALC 65   HgbA1c:  Recent Labs  Lab 07/29/20 0455  HGBA1C 9.6*   Urine Drug Screen: No results for input(s): LABOPIA, COCAINSCRNUR, LABBENZ, AMPHETMU, THCU, LABBARB in the last 168 hours.  Alcohol Level No results for input(s): ETH in the last 168 hours.  IMAGING past 24 hours CT HEAD WO CONTRAST  Result Date: 07/29/2020 IMPRESSION:  1. Right insular and peri-insular acute infarct.  2. Increased subarachnoid density at the right vertex, presumably diluted subarachnoid blood.   CT HEAD WO CONTRAST  Result Date: 07/28/2020 IMPRESSION:  1. Expected evolution of right MCA territory infarct without hemorrhagic conversion or mass effect.  2. Old left MCA infarct.   PHYSICAL EXAM  Temp:  [98.1 F (36.7 C)-100 F (37.8 C)] 99 F (37.2 C) (07/09 0800) Pulse Rate:  [70-111] 71 (07/09 1000) Resp:  [18-26] 22 (07/09 1000) BP:  (112-174)/(63-90) 164/82 (07/09 1000) SpO2:  [84 %-100 %] 99 % (07/09 1000)  General - Well nourished, well developed, mildly lethargic.  Ophthalmologic - fundi not visualized due to noncooperation.  Cardiovascular - irregularly irregular heart rate and rhythm, on tele sinus arrhythmia but not in afib.  Neuro - awake, alert, lethargic but eyes open, nonverbal not answer questions, ? anarthria, following simple commands on right hand and foot, but not central midline comments. Not able to name or repeat. Eyes tracking bilaterally, no gaze palsy, blinking to visual threat on the right but not consistent on the left, PERRL. Left facial droop. Tongue protrusion not cooperative. Bilateral UEs 4/5, no drift, left UE below elbow amputation. BLE proximal 3-/5, and distal RLE 4/5 and LLE 3/5. Sensation not cooperative, R FTN intact although slow, gait not tested.    ASSESSMENT/PLAN Jennifer Lucero is a 70 y.o. female with history of A-fib on eliquis, DM2, HTN, HLD presenting with left sided weakness and rightward gaze preference.  CT head showed acute right MCA territory infarct.  CTA showed right M2 occlusion.  Now s/p thrombectomy with complete revascularization TICI 3.   Stroke - right MCA stroke secondary to right  M2 occlusion s/p mechanical thrombectomy with TICI3 reperfusion in the setting of afib on Eliquis CT head acute right MCA stroke and old left MCA infarct.  CTA head & neck: right M2 occlusion S/p IR with TICI3 Repeat CT head: Right insular and peri-insular acute infarct. Increased subarachnoid density at the right vertex. MRI  large right MCA infarct with petechial HT. Old left frontal and cerebellar infarcts 2D Echo EF 55-60% LDL 65 HgbA1c 9.6 VTE prophylaxis - SCDs aspirin 81 mg daily and Eliquis (apixaban) daily prior to admission, now on ASA 325mg . Consider to resume eliquis in 10-14 days given large infarct with HT.  Therapy recommendations: CIR Disposition:   pending  Seizures Seizure activity at approximately 3 am 7/7 Loaded with Keppra 2 grams IV EEG no seizure but cortical dysfunction at right hemisphere, moderate diffuse encephalopathy Continue Keppra 500 mg bid Monitor seizure activity  PAF On eliquis PTA Now eliquis on hold given large right MCA Infarct with HT On ASA 325 now Consider to resume elqiuis in 10-14 days  Respiratory difficulty Extubated but difficulty controlling secretions Per Dr. Carlis Abbott CCM, pt high risk of re-intubation Husband agree with reintubation if needed Continue monitoring in ICU  Hypertension Home meds:  imdur 120mg  daily, lisinopril 20mg  daily, toprol XL 50mg  daily, Maxzide 37.5/25 Stable but on the high end Resume metoprolol and lisinopril SBP goal <160 given HT Hydralazine and labetalol prn Long-term BP goal normotensive  Hyperlipidemia Home meds:  Pravastatin 10mg  daily, resumed pravastatin LDL 65, goal < 70  Continue statin at discharge  Diabetes type II Uncontrolled Home meds:  levemir, metformin 500mg  bid, glimepiride 4mg  daily HALP3X 9.6, goal < 7.0 CBGs SSI Now on lantus PCP follow up closely  Leukocytosis WBC 12.6-14.6-12.1-12.6 UA negative CXR negative Close CBC monitoring  Dysphagia Did not pass swallow Cortrak placed On tube feeding @ 50 and IV fluid @ 25 Cre 1.21-1.30-1.08-1.04  Other Stroke Risk Factors Advanced Age >/= 83  Remote ETOH use and drug use Obesity, Body mass index is 37.09 kg/m., BMI >/= 30 associated with increased stroke risk, recommend weight loss, diet and exercise as appropriate  Hx stroke: left MCA infact, May 2019  Other Active Problems Diabetic neuropathy  GERD: nexium 40mg  daily Hx of pulmonary embolus (11/01/2017) Splenic infarction anxiety  Hospital day # 3  This patient is critically ill due to large right MCA stroke, hemorrhagic conversion, PAF, seizure, respiratory distress and at significant risk of neurological worsening, death  form cerebral edema, brain herniation, heart failure, status epilepticus, respiratory failure. This patient's care requires constant monitoring of vital signs, hemodynamics, respiratory and cardiac monitoring, review of multiple databases, neurological assessment, discussion with family, other specialists and medical decision making of high complexity. I spent 35 minutes of neurocritical care time in the care of this patient. I discussed with Dr. Carlis Abbott CCM  Rosalin Hawking, MD PhD Stroke Neurology 07/31/2020 11:35 AM  To contact Stroke Continuity provider, please refer to [email or web address] After hours, contact General Neurology

## 2020-07-31 NOTE — Progress Notes (Signed)
NAME:  Jennifer Lucero, MRN:  [id number], DOB:  1950/12/19, LOS: 3 ADMISSION DATE:  07/28/2020, CONSULTATION DATE:  7/6 REFERRING MD:  Dr. Leonel Ramsay, CHIEF COMPLAINT:  Right M2 occlusion   History of Present Illness:  Patient is a 70 yo F w/ PMH of A-fib on eliquis, DM2, HTN, HLD presenting to [facility] on 7/6 w/ L sided weakness which began this morning.  On 7/6 around 9am patient had L sided weakness and sudden onset rightward gaze and transported to [facility] ER. Patient had emergent CT/CTA which showed R M2 occlusion. Also showed previous L sided stroke. Tpa was not given. Transported to [facility]. Patient underwent IR thrombectomy w/ complete revascularization. Patient was extubated but is still lethargic. On simple mask 8-10 liters and breathing heavy.   Significant Hospital Events: Including procedures, antibiotic start and stop dates in addition to other pertinent events   7/6: Right M2 occlusion; admitted to [facility]: IR thrombectomy w/ complete revascularization 7/8 cortrak placed after failed 2nd SLP swallow eval  Interim History / Subjective:  She denies complaints today.  Objective   Blood pressure (!) 160/78, pulse 72, temperature 99 F (37.2 C), temperature source Axillary, resp. rate (!) 24, weight 98 kg, SpO2 96 %.        Intake/Output Summary (Last 24 hours) at 07/31/2020 0942 Last data filed at 07/31/2020 0900 Gross per 24 hour  Intake 1894.92 ml  Output 800 ml  Net 1094.92 ml    Filed Weights   07/29/20 0300  Weight: 98 kg    Examination: General:  chronically ill appearing woman lying in bed in NAD HEENT: [place]/AT, eyes anicteric, oral mucosa moist. Cortrak. Neuro: RASS 0, tracking more to R than left, but can be encouraged to look left. Full strength RUE & RLE, able to lift LUE off the bed, but dense paresis LLE. Nodding to answer yes and no questions. Trying to grunt/ moan more to get my attention. Cough slightly more coordinated but still ineffective. PULM: tachypnea, still  some upper airway secretions, faint rhales Cardiac: S1S2, RRR GI: soft, NT Extremities: L hand amputation, no edema Skin: warm, dry, no rashes  EKG: NSR, PACS, L axis deviation. Bifasicular block. No ischemic TWI or ST segment deviation.  Resolved Hospital Problem list     Assessment & Plan:  Acute right MCA stroke status post thrombectomy with TICI 3, possible small volume of R ICH blood Aphasia, Dysphagia, dense LLE paresis but LUE getting stronger -Con't therapy with SLP, OT, PT -Con't cortrak for meds and nutrition -Doing better today but still at risk for reintubation due to inability to effectively manage secretions. If she requires reintubation, suspect she will need trach as this is unlikely to resolve quickly. -hold systemic AC for Afib at this point given risk of hemorrhagic conversion; considering restarting after 10-14 days per neurology -start pravastatin daily  Seizure post-CVA -con't keppra -seizure precautions   Hypertension Paroxysmal A. Fib w/ controlled rate -Stroke team is following; appreciate their management. Likely to need inpatient rehab after hospital discharge. -Continue secondary stroke prophylaxis. Starting aspirin today. -Continue frequent neuro monitoring. -SBP goal <160 -Con't metoprolol, can consider adding Imdur and lisinopril if she remains uncontrolled.  -Continue as needed labetalol -Consider resuming Eliquis 10-14 days after stroke due to hemorrhagic transformation  Acute hypoxic respiratory failure, likely acute pulmonary edema and aspiration  Morbid obesity Probable obstructive sleep apnea -wean O2 as able; goal SpOP >90% -Holding diuresis for now as PO intake is limited -Close monitoring of swallow status;  she is high risk for reintubation based on poor cough mechanics and poor throat clearing. -Pulmonary hygiene as able  Uncontrolled diabetes type 2- A1c 9.6 -Continue Lantus 20 units daily -start TF coverage 2 units Q4h with hold  parameters -SSI PRN -goal BG 140-180 -con't TF  Hypomagnesemia -repleted -monitor  CKD 3A present on admit -renally dose meds, avoid nephrotoxic meds -strict I/Os -maintain adequate renal perfusion -monitor  Acute anemia present on admission -likely due to chronic illness -transfuse for Hb<7 or hemodynamically significant bleeding  Chest pain overnight. EKG without ischemic changes. -troponin pending -tele monitoring  Updated husband [name] by phone.  Best Practice (right click and "Reselect all SmartList Selections" daily)   Diet/type: tubefeeds and NPO, speech and swallow evaluation is pending DVT prophylaxis: SCD GI prophylaxis: N/A Lines: N/A Foley:  N/A Code Status:  full code Last date of multidisciplinary goals of care discussion [ 7/8- con't aggressive care ]  Labs   CBC: Recent Labs  Lab 07/28/20 1515 07/28/20 1708 07/29/20 0455 07/30/20 0512 07/31/20 0259  WBC  --  12.6* 14.6* 12.1* 12.6*  NEUTROABS  --   --  10.9*  --   --   HGB 9.5* 11.0* 9.8* 9.8* 9.8*  HCT 28.0* 34.0* 31.5* 30.4* 30.8*  MCV  --  90.7 90.8 88.6 89.0  PLT  --  310 332 278 289     Basic Metabolic Panel: Recent Labs  Lab 07/28/20 1515 07/28/20 1708 07/29/20 0455 07/30/20 0512 07/31/20 0259  NA 144 140 142 140 138  K 4.1 5.2* 4.5 4.1 4.3  CL  --  113* 112* 110 106  CO2  --  19* 20* 23 26  GLUCOSE  --  130* 194* 112* 151*  BUN  --  18 20 12 17   CREATININE  --  1.21* 1.30* 1.08* 1.04*  CALCIUM  --  8.3* 8.2* 8.3* 8.8*  MG  --   --   --   --  1.5*  PHOS  --   --   --   --  3.6    GFR: Estimated Creatinine Clearance: 55.6 mL/min (A) (by C-G formula based on SCr of 1.04 mg/dL (H)). Recent Labs  Lab 07/28/20 1708 07/29/20 0455 07/30/20 0512 07/31/20 0259  WBC 12.6* 14.6* 12.1* 12.6*     Julian Hy, DO 07/31/20 10:39 AM [facility]

## 2020-08-01 DIAGNOSIS — R451 Restlessness and agitation: Secondary | ICD-10-CM | POA: Diagnosis not present

## 2020-08-01 DIAGNOSIS — I63511 Cerebral infarction due to unspecified occlusion or stenosis of right middle cerebral artery: Secondary | ICD-10-CM | POA: Diagnosis not present

## 2020-08-01 DIAGNOSIS — D72829 Elevated white blood cell count, unspecified: Secondary | ICD-10-CM | POA: Diagnosis not present

## 2020-08-01 DIAGNOSIS — N1831 Chronic kidney disease, stage 3a: Secondary | ICD-10-CM

## 2020-08-01 DIAGNOSIS — R1310 Dysphagia, unspecified: Secondary | ICD-10-CM | POA: Diagnosis not present

## 2020-08-01 DIAGNOSIS — I10 Essential (primary) hypertension: Secondary | ICD-10-CM | POA: Diagnosis not present

## 2020-08-01 DIAGNOSIS — I6601 Occlusion and stenosis of right middle cerebral artery: Secondary | ICD-10-CM | POA: Diagnosis not present

## 2020-08-01 DIAGNOSIS — R4701 Aphasia: Secondary | ICD-10-CM

## 2020-08-01 LAB — BASIC METABOLIC PANEL
Anion gap: 9 (ref 5–15)
BUN: 22 mg/dL (ref 8–23)
CO2: 27 mmol/L (ref 22–32)
Calcium: 9 mg/dL (ref 8.9–10.3)
Chloride: 102 mmol/L (ref 98–111)
Creatinine, Ser: 1.04 mg/dL — ABNORMAL HIGH (ref 0.44–1.00)
GFR, Estimated: 57 mL/min — ABNORMAL LOW (ref >60)
Glucose, Bld: 151 mg/dL — ABNORMAL HIGH (ref 70–99)
Potassium: 4.1 mmol/L (ref 3.5–5.1)
Sodium: 138 mmol/L (ref 135–145)

## 2020-08-01 LAB — GLUCOSE, CAPILLARY
Glucose-Capillary: 119 mg/dL — ABNORMAL HIGH (ref 70–99)
Glucose-Capillary: 127 mg/dL — ABNORMAL HIGH (ref 70–99)
Glucose-Capillary: 134 mg/dL — ABNORMAL HIGH (ref 70–99)
Glucose-Capillary: 156 mg/dL — ABNORMAL HIGH (ref 70–99)
Glucose-Capillary: 168 mg/dL — ABNORMAL HIGH (ref 70–99)
Glucose-Capillary: 193 mg/dL — ABNORMAL HIGH (ref 70–99)

## 2020-08-01 LAB — CBC
HCT: 30.8 % — ABNORMAL LOW (ref 36.0–46.0)
Hemoglobin: 9.6 g/dL — ABNORMAL LOW (ref 12.0–15.0)
MCH: 28.2 pg (ref 26.0–34.0)
MCHC: 31.2 g/dL (ref 30.0–36.0)
MCV: 90.6 fL (ref 80.0–100.0)
Platelets: 282 10*3/uL (ref 150–400)
RBC: 3.4 MIL/uL — ABNORMAL LOW (ref 3.87–5.11)
RDW: 15.4 % (ref 11.5–15.5)
WBC: 10.4 10*3/uL (ref 4.0–10.5)
nRBC: 0 % (ref 0.0–0.2)

## 2020-08-01 LAB — PHOSPHORUS: Phosphorus: 4.2 mg/dL (ref 2.5–4.6)

## 2020-08-01 LAB — MAGNESIUM: Magnesium: 2 mg/dL (ref 1.7–2.4)

## 2020-08-01 MED ORDER — QUETIAPINE FUMARATE 25 MG PO TABS
25.0000 mg | ORAL_TABLET | Freq: Every day | ORAL | Status: DC
  Filled 2020-08-01 (×2): qty 1

## 2020-08-01 MED ORDER — HALOPERIDOL LACTATE 5 MG/ML IJ SOLN
2.0000 mg | Freq: Once | INTRAMUSCULAR | Status: AC
  Administered 2020-08-01: 2 mg via INTRAVENOUS
  Filled 2020-08-01: qty 1

## 2020-08-01 MED ORDER — ISOSORBIDE DINITRATE 30 MG PO TABS
60.0000 mg | ORAL_TABLET | Freq: Two times a day (BID) | ORAL | Status: DC
  Administered 2020-08-01 – 2020-08-05 (×9): 60 mg
  Filled 2020-08-01 (×10): qty 2

## 2020-08-01 NOTE — Progress Notes (Signed)
  Speech Language Pathology Treatment: Dysphagia  Patient Details Name: Jennifer Lucero MRN: [id number] DOB: [birth year] Today's Date: 08/01/2020 Time: 0945-1000 SLP Time Calculation (min) (ACUTE ONLY): 15 min  Assessment / Plan / Recommendation Clinical Impression  Jennifer Lucero demonstrates an ongoing severe oral dysphagia directly related to impaired alertness/mentation. She was unable to follow directions despite max verbal and tactile cues. Oral position remains open with lingual protrusion at rest. RN assisted SLP in repositioning patient upright. Pt had decline in O2 saturation with repositioning to 85%, so [place] was placed back on patient; O2 returned to high 90s quickly. Oral care was provided with suction. Pt noted to suck on toothbrush and manipulate orally. No visible swallows triggered. With single ice chip attempt, pt did not close lips around spoon/ice, so it eventually spilled anteriorly. 1/2 tsp attempt of water also resulted in anterior spillage despite verbal and tactile cues for closing lips. Trials discontinued. Mouth moisturizer applied. Husband was educated on importance of oral care, that pt may have CLEAN oral swab dipped in water for oral comfort intermittently. He was provided several clean oral swabs and verbalized understanding not to re-use them. SLP service will follow as pt's mentation improves for further evaluation of swallow function. At this time, pt should remain NPO with NGT for nutrition.    HPI HPI: 70 yo admitted with left sided weakness, rightward gaze. CT right insular/peri-insular acute infarct, increased subarachnoid density at the right vertex, presumably diluted subarachnoid blood. Underwent thrombectomy with complete revascularization. PMH: GERD, esophagitis, stroke 2019 (no prior ST notes), A-fib, DM2, HTN, HLD      SLP Plan  Continue with current plan of care       Recommendations  Diet recommendations: NPO;Other(comment) (moist oral swabs for  comfort) Medication Administration: Via alternative means                Oral Care Recommendations: Oral care QID Follow up Recommendations: Inpatient Rehab;Skilled Nursing facility SLP Visit Diagnosis: Dysphagia, oral phase (R13.11) Plan: Continue with current plan of care       Jennifer Lucero, M.S., CCC-SLP Speech-Language Pathologist Acute Rehabilitation Services Pager: [place] 08/01/2020, 10:06 AM

## 2020-08-01 NOTE — Progress Notes (Signed)
STROKE TEAM PROGRESS NOTE   INTERVAL HISTORY RN and husband are at the bedside.  She is lying in bed, on restrain. Per RN, pt agitated last night, did not sleep at all. This morning she was sleepy but intermittent agitation, on restrain. Will add seroquel at night. Respiratory wise, doing OK, no distress, no need of re-intubation. No fever. Still nonverbal, moving all extremities.   Vitals:   08/01/20 0800 08/01/20 0900 08/01/20 1000 08/01/20 1100  BP: (!) 161/75 (!) 157/73 (!) 157/70 (!) 144/67  Pulse:  (!) 59 61 60  Resp: 20 15 13 15   Temp: 98.8 F (37.1 C)     TempSrc: Axillary     SpO2:  95% 99% 100%  Weight:       CBC:  Recent Labs  Lab 07/29/20 0455 07/30/20 0512 07/31/20 0259 08/01/20 0126  WBC 14.6*   < > 12.6* 10.4  NEUTROABS 10.9*  --   --   --   HGB 9.8*   < > 9.8* 9.6*  HCT 31.5*   < > 30.8* 30.8*  MCV 90.8   < > 89.0 90.6  PLT 332   < > 289 282   < > = values in this interval not displayed.   Basic Metabolic Panel:  Recent Labs  Lab 07/31/20 0259 08/01/20 0126  NA 138 138  K 4.3 4.1  CL 106 102  CO2 26 27  GLUCOSE 151* 151*  BUN 17 22  CREATININE 1.04* 1.04*  CALCIUM 8.8* 9.0  MG 1.5* 2.0  PHOS 3.6 4.2   Lipid Panel:  Recent Labs  Lab 07/29/20 0455  CHOL 129  TRIG 84  HDL 47  CHOLHDL 2.7  VLDL 17  LDLCALC 65   HgbA1c:  Recent Labs  Lab 07/29/20 0455  HGBA1C 9.6*   Urine Drug Screen: No results for input(s): LABOPIA, COCAINSCRNUR, LABBENZ, AMPHETMU, THCU, LABBARB in the last 168 hours.  Alcohol Level No results for input(s): ETH in the last 168 hours.  IMAGING past 24 hours CT HEAD WO CONTRAST  Result Date: 07/29/2020 IMPRESSION:  1. Right insular and peri-insular acute infarct.  2. Increased subarachnoid density at the right vertex, presumably diluted subarachnoid blood.   CT HEAD WO CONTRAST  Result Date: 07/28/2020 IMPRESSION:  1. Expected evolution of right MCA territory infarct without hemorrhagic conversion or mass effect.   2. Old left MCA infarct.   PHYSICAL EXAM  Temp:  [98.2 F (36.8 C)-99.1 F (37.3 C)] 98.8 F (37.1 C) (07/10 0800) Pulse Rate:  [59-91] 60 (07/10 1100) Resp:  [13-29] 15 (07/10 1100) BP: (122-174)/(51-111) 144/67 (07/10 1100) SpO2:  [88 %-100 %] 100 % (07/10 1100) Weight:  [101.6 kg] 101.6 kg (07/10 0400)  General - Well nourished, well developed, sleepy but intermittent agitation.  Ophthalmologic - fundi not visualized due to noncooperation.  Cardiovascular - irregularly irregular heart rate and rhythm, on tele sinus arrhythmia but not in afib.  Neuro - sleepy with intermittent agitation and eye opening, nonverbal not answer questions, ? anarthria, did not follow simple commands today. Not able to name or repeat. Eyes tracking bilaterally, no gaze palsy, blinking to visual threat on the right but not consistent on the left, PERRL. Left facial droop. Tongue protrusion not cooperative. RUEs 4/5, and LUE 3/5, left UE below elbow amputation. BLE proximal 3/5 and distal not cooperative on exam. Sensation and coordination not cooperative, gait not tested.    ASSESSMENT/PLAN [name] is a 70 y.o. female with history of  A-fib on eliquis, DM2, HTN, HLD presenting with left sided weakness and rightward gaze preference.  CT head showed acute right MCA territory infarct.  CTA showed right M2 occlusion.  Now s/p thrombectomy with complete revascularization TICI 3.   Stroke - right MCA stroke secondary to right M2 occlusion s/p mechanical thrombectomy with TICI3 reperfusion in the setting of afib on Eliquis CT head acute right MCA stroke and old left MCA infarct.  CTA head & neck: right M2 occlusion S/p IR with TICI3 Repeat CT head: Right insular and peri-insular acute infarct. Increased subarachnoid density at the right vertex. MRI  large right MCA infarct with petechial HT. Old left frontal and cerebellar infarcts 2D Echo EF 55-60% LDL 65 HgbA1c 9.6 VTE prophylaxis -  SCDs aspirin 81 mg daily and Eliquis (apixaban) daily prior to admission, now on ASA 325mg . Consider to resume eliquis in 10-14 days given large infarct with HT.  Therapy recommendations: CIR Disposition:  pending  Seizures Seizure activity at approximately 3 am 7/7 Loaded with Keppra 2 grams IV EEG no seizure but cortical dysfunction at right hemisphere, moderate diffuse encephalopathy Continue Keppra 500 mg bid Monitor seizure activity  PAF On eliquis PTA Now eliquis on hold given large right MCA Infarct with HT On ASA 325 now Consider to resume elqiuis in 10-14 days  Respiratory difficulty Extubated but difficulty controlling secretions So far tolerating extubation well CCM on board for re-intubation if needed  Hypertension Home meds:  imdur 120mg  daily, lisinopril 20mg  daily, toprol XL 50mg  daily, Maxzide 37.5/25 Stable but on the high end Resume metoprolol and lisinopril SBP goal <160 given HT Hydralazine and labetalol prn Long-term BP goal normotensive  Hyperlipidemia Home meds:  Pravastatin 10mg  daily LDL 65, goal < 70  resumed pravastatin 10, no high intensity statin due to LDL at goal and advanced age Continue statin at discharge  Diabetes type II Uncontrolled Home meds:  levemir, metformin 500mg  bid, glimepiride 4mg  daily HgbA1c 9.6, goal < 7.0 CBGs SSI Now on lantus PCP follow up closely  Leukocytosis, improving WBC 12.6-14.6-12.1-12.6-10.4 UA negative CXR negative Close CBC monitoring  Dysphagia Did not pass swallow Cortrak placed On tube feeding @ 50 and IV fluid @ 25 Cre 1.21-1.30-1.08-1.04-1.04  Agitation and delirium Agitation overnight On restrain On seroquel 25 Qhs Close monitoring  Other Stroke Risk Factors Advanced Age >/= 4  Remote ETOH use and drug use Obesity, Body mass index is 38.45 kg/m., BMI >/= 30 associated with increased stroke risk, recommend weight loss, diet and exercise as appropriate  Hx stroke: left MCA infact,  May 2019  Other Active Problems Diabetic neuropathy  GERD: nexium 40mg  daily Hx of pulmonary embolus (11/01/2017) Splenic infarction anxiety  Hospital day # 4  This patient is critically ill due to right large MCA with hemorrhagic conversion, PAF, seizure, agitation and delirium and at significant risk of neurological worsening, death form recurrent stroke, hemorrhagic conversion, status epilepticus, encephalopathy. This patient's care requires constant monitoring of vital signs, hemodynamics, respiratory and cardiac monitoring, review of multiple databases, neurological assessment, discussion with family, other specialists and medical decision making of high complexity. I spent 40 minutes of neurocritical care time in the care of this patient. I had long discussion with husband at bedside, updated pt current condition, treatment plan and potential prognosis, and answered all the questions. He expressed understanding and appreciation. I also discussed with Dr. Carlis Abbott CCM   Rosalin Hawking, MD PhD Stroke Neurology 08/01/2020 11:59 AM  To contact Stroke Continuity provider, please  refer to [email or web address] After hours, contact General Neurology

## 2020-08-01 NOTE — Progress Notes (Signed)
NAME:  Jennifer Lucero, MRN:  [id number], DOB:  12-22-1950, LOS: 4 ADMISSION DATE:  07/28/2020, CONSULTATION DATE:  7/6 REFERRING MD:  Dr. Leonel Ramsay, CHIEF COMPLAINT:  Right M2 occlusion   History of Present Illness:  Patient is a 70 yo F w/ PMH of A-fib on eliquis, DM2, HTN, HLD presenting to [facility] on 7/6 w/ L sided weakness which began this morning.  On 7/6 around 9am patient had L sided weakness and sudden onset rightward gaze and transported to [facility] ER. Patient had emergent CT/CTA which showed R M2 occlusion. Also showed previous L sided stroke. Tpa was not given. Transported to [facility]. Patient underwent IR thrombectomy w/ complete revascularization. Patient was extubated but is still lethargic. On simple mask 8-10 liters and breathing heavy.   Significant Hospital Events: Including procedures, antibiotic start and stop dates in addition to other pertinent events   7/6: Right M2 occlusion; admitted to [facility]: IR thrombectomy w/ complete revascularization 7/8 cortrak placed after failed 2nd SLP swallow eval 7/9 moving more, starting to grunt and coordinate a cough a little better.  Interim History / Subjective:  Did not sleep overnight, very agitated during bath this morning, now sleeping.  Objective   Blood pressure (!) 161/75, pulse 90, temperature 98.2 F (36.8 C), temperature source Oral, resp. rate 20, weight 101.6 kg, SpO2 92 %.        Intake/Output Summary (Last 24 hours) at 08/01/2020 6283 Last data filed at 08/01/2020 0800 Gross per 24 hour  Intake 1767.83 ml  Output 1000 ml  Net 767.83 ml    Filed Weights   07/29/20 0300 08/01/20 0400  Weight: 98 kg 101.6 kg    Examination: General: Chronically ill-appearing woman lying in bed sleeping HEENT: [place]/AT, eyes anicteric Neuro: RASS -2, moving all extremities spontaneously, not consistently moving the left side on command but with significant encouragement she will pick her left arm up.  Repositioning herself in bed  more. PULM: Tachypnea improved.  Saturating well on room air.  CTAB. Cardiac: S1-S2, regular rate, irregular rhythm GI: Soft, nontender, nondistended Extremities: Previous left hand amputation, no clubbing or cyanosis Skin: No rashes, warm and dry  WBC 10.4 H/H 9.6/30.8  Resolved Hospital Problem list     Assessment & Plan:  Acute right MCA stroke status post thrombectomy with TICI 3, possible small volume of R ICH blood Aphasia, Dysphagia, Left weakness -Con't therapy with SLP, OT, PT. anticipate she will have a long rehab course. -Con't cortrak for meds and nutrition.  Discussed with her husband today that it would likely be several weeks before she is able to meet her nutritional needs on her own. -hold systemic AC for Afib at this point given risk of hemorrhagic conversion; considering restarting after 10-14 days per neurology -Continue pravastatin daily  Seizure post-CVA -con't keppra -seizure precautions   Hypertension Paroxysmal A. Fib w/ controlled rate -Stroke team is following; appreciate their management. Likely to need inpatient rehab after hospital discharge. -Continue secondary stroke prophylaxis.  Continue aspirin. -Continue frequent neuro monitoring. -SBP goal <160 -Con't metoprolol.  Adding Isordil in place of Imdur today. -Continue as needed labetalol. -Consider resuming Eliquis 10-14 days after stroke due to hemorrhagic transformation.  Acute hypoxic respiratory failure, likely acute pulmonary edema and aspiration  Morbid obesity Probable obstructive sleep apnea -wean O2 as able> currently doing well on on room air.  Maintain saturations greater than 90%. -Monitor volume status closely; holding diuresis for now -Maintaining her airway more effectively now -Pulmonary hygiene as able  Uncontrolled diabetes type 2- A1c 9.6.  Hyperglycemia currently controlled. -Continue Lantus 20 units daily -Continue TF coverage 2 units Q4h with hold parameters -SSI  PRN -goal BG 140-180 -Continue tube feeds  Hypomagnesemia, resolved -Continue to monitor  CKD 3A present on admit -renally dose meds, avoid nephrotoxic meds -strict I/Os -maintain adequate renal perfusion -Continue to monitor  Acute anemia present on admission-stable -likely due to chronic illness -transfuse for Hb<7 or hemodynamically significant bleeding  Updated husband [name] at bedside this morning.  Stable to stepdown from the ICU today.  Will discuss with neurology.  PCCM will sign off when she leaves the ICU.  Best Practice (right click and "Reselect all SmartList Selections" daily)   Diet/type: tubefeeds and NPO DVT prophylaxis: SCD GI prophylaxis: N/A Lines: N/A Foley:  N/A Code Status:  full code Last date of multidisciplinary goals of care discussion [ 7/8- con't aggressive care ]  Labs   CBC: Recent Labs  Lab 07/28/20 1708 07/29/20 0455 07/30/20 0512 07/31/20 0259 08/01/20 0126  WBC 12.6* 14.6* 12.1* 12.6* 10.4  NEUTROABS  --  10.9*  --   --   --   HGB 11.0* 9.8* 9.8* 9.8* 9.6*  HCT 34.0* 31.5* 30.4* 30.8* 30.8*  MCV 90.7 90.8 88.6 89.0 90.6  PLT 310 332 278 289 282     Basic Metabolic Panel: Recent Labs  Lab 07/28/20 1708 07/29/20 0455 07/30/20 0512 07/31/20 0259 08/01/20 0126  NA 140 142 140 138 138  K 5.2* 4.5 4.1 4.3 4.1  CL 113* 112* 110 106 102  CO2 19* 20* 23 26 27   GLUCOSE 130* 194* 112* 151* 151*  BUN 18 20 12 17 22   CREATININE 1.21* 1.30* 1.08* 1.04* 1.04*  CALCIUM 8.3* 8.2* 8.3* 8.8* 9.0  MG  --   --   --  1.5* 2.0  PHOS  --   --   --  3.6 4.2    GFR: Estimated Creatinine Clearance: 56.7 mL/min (A) (by C-G formula based on SCr of 1.04 mg/dL (H)). Recent Labs  Lab 07/29/20 0455 07/30/20 0512 07/31/20 0259 08/01/20 0126  WBC 14.6* 12.1* 12.6* 10.4     Julian Hy, DO 08/01/20 8:59 AM [facility]

## 2020-08-02 DIAGNOSIS — J9601 Acute respiratory failure with hypoxia: Secondary | ICD-10-CM | POA: Diagnosis not present

## 2020-08-02 DIAGNOSIS — I480 Paroxysmal atrial fibrillation: Secondary | ICD-10-CM

## 2020-08-02 DIAGNOSIS — E1165 Type 2 diabetes mellitus with hyperglycemia: Secondary | ICD-10-CM | POA: Diagnosis not present

## 2020-08-02 DIAGNOSIS — I6601 Occlusion and stenosis of right middle cerebral artery: Secondary | ICD-10-CM | POA: Diagnosis not present

## 2020-08-02 DIAGNOSIS — D72829 Elevated white blood cell count, unspecified: Secondary | ICD-10-CM | POA: Diagnosis not present

## 2020-08-02 DIAGNOSIS — I4821 Permanent atrial fibrillation: Secondary | ICD-10-CM | POA: Diagnosis not present

## 2020-08-02 LAB — CBC
HCT: 31.8 % — ABNORMAL LOW (ref 36.0–46.0)
Hemoglobin: 10.2 g/dL — ABNORMAL LOW (ref 12.0–15.0)
MCH: 28.7 pg (ref 26.0–34.0)
MCHC: 32.1 g/dL (ref 30.0–36.0)
MCV: 89.6 fL (ref 80.0–100.0)
Platelets: 311 10*3/uL (ref 150–400)
RBC: 3.55 MIL/uL — ABNORMAL LOW (ref 3.87–5.11)
RDW: 15.1 % (ref 11.5–15.5)
WBC: 10.6 10*3/uL — ABNORMAL HIGH (ref 4.0–10.5)
nRBC: 0 % (ref 0.0–0.2)

## 2020-08-02 LAB — BASIC METABOLIC PANEL
Anion gap: 8 (ref 5–15)
BUN: 25 mg/dL — ABNORMAL HIGH (ref 8–23)
CO2: 28 mmol/L (ref 22–32)
Calcium: 9.3 mg/dL (ref 8.9–10.3)
Chloride: 105 mmol/L (ref 98–111)
Creatinine, Ser: 1.04 mg/dL — ABNORMAL HIGH (ref 0.44–1.00)
GFR, Estimated: 57 mL/min — ABNORMAL LOW (ref >60)
Glucose, Bld: 121 mg/dL — ABNORMAL HIGH (ref 70–99)
Potassium: 4.4 mmol/L (ref 3.5–5.1)
Sodium: 141 mmol/L (ref 135–145)

## 2020-08-02 LAB — PHOSPHORUS: Phosphorus: 5.1 mg/dL — ABNORMAL HIGH (ref 2.5–4.6)

## 2020-08-02 LAB — GLUCOSE, CAPILLARY
Glucose-Capillary: 98 mg/dL (ref 70–99)
Glucose-Capillary: 161 mg/dL — ABNORMAL HIGH (ref 70–99)
Glucose-Capillary: 209 mg/dL — ABNORMAL HIGH (ref 70–99)
Glucose-Capillary: 119 mg/dL — ABNORMAL HIGH (ref 70–99)
Glucose-Capillary: 177 mg/dL — ABNORMAL HIGH (ref 70–99)

## 2020-08-02 LAB — MAGNESIUM: Magnesium: 2 mg/dL (ref 1.7–2.4)

## 2020-08-02 MED ORDER — QUETIAPINE FUMARATE 25 MG PO TABS
25.0000 mg | ORAL_TABLET | Freq: Every day | ORAL | Status: DC
  Administered 2020-08-02 – 2020-08-04 (×3): 25 mg
  Filled 2020-08-02 (×3): qty 1

## 2020-08-02 NOTE — Progress Notes (Signed)
Inpatient Rehab Admissions Coordinator:   I spoke with pt. And husband regarding CIR admit. They are interested and pt.'s husband reports he can provide 24/7 support at discharge. I will follow and pursue admit pending insurance auth, bed availability, and medical readiness.  Clemens Catholic, [place], [place] Admissions Coordinator  [phone] ([place]) [phone] (office)

## 2020-08-02 NOTE — Progress Notes (Addendum)
Physical Therapy Treatment Patient Details Name: Jennifer Lucero MRN: [id number] DOB: May 05, 1950 Today's Date: 08/02/2020    History of Present Illness Jennifer Lucero is a 70 y.o. female who presented 7/6 with left-sided weakness that started abruptly and sudden onset of rightward gaze.CT/CT angiogram demonstrated right M2 occlusion and previous left-sided stroke. MRI: Evolving acute to early subacute right MCA distribution infarct.  Pt s/p RT common carotid arteriogram followed by complete revascularization of occluded RT MCA. PHMx: atrial fibrillation, diabetes, hypertension, hyperlipidemia, left below elbow amputation.    PT Comments    Pt progressing towards her physical therapy goals. Remains non verbal, but fairly accurate with yes/no responses and following 75% of one step commands. Pt requiring two person minimal assist for functional mobility. Ambulating to bathroom and back with handheld assist and guidance. HR 78-122 afib, BP sitting EOB pre mobility 156/108, sitting post mobility 124/68, spO2 > 90% on RA. Pt demonstrates left neglect, balance deficits, decreased cognition, communication impairments, left sided weakness. Continue to recommend comprehensive inpatient rehab (CIR) for post-acute therapy needs.   Follow Up Recommendations  CIR;Supervision/Assistance - 24 hour     Equipment Recommendations  3in1 (PT)    Recommendations for Other Services Rehab consult     Precautions / Restrictions Precautions Precautions: Fall Precaution Comments: Cortrak, L below elbow amputation hx Restrictions Weight Bearing Restrictions: No    Mobility  Bed Mobility Overal bed mobility: Needs Assistance Bed Mobility: Supine to Sit     Supine to sit: Min assist     General bed mobility comments: Pt with legs hanging off bed upon entry, minA at trunk to sit up onto left side of bed    Transfers Overall transfer level: Needs assistance Equipment used: 2 person hand held  assist Transfers: Sit to/from Stand Sit to Stand: Min assist;+2 physical assistance         General transfer comment: Pt standing from edge of bed, toilet and recliner with minA, narrow BOS  Ambulation/Gait Ambulation/Gait assistance: Min assist;+2 physical assistance Gait Distance (Feet): 30 Feet (x2 bouts of 15 ft) Assistive device: 2 person hand held assist Gait Pattern/deviations: Step-through pattern;Decreased stride length Gait velocity: reduced Gait velocity interpretation: <1.8 ft/sec, indicate of risk for recurrent falls General Gait Details: Pt with slow, unsteady gait, requiring minA +2 for balance. Handheld guidance for turns and environmental negotiation.   Stairs             Wheelchair Mobility    Modified Rankin (Stroke Patients Only) Modified Rankin (Stroke Patients Only) Pre-Morbid Rankin Score: No symptoms Modified Rankin: Moderately severe disability     Balance Overall balance assessment: Needs assistance Sitting-balance support: No upper extremity supported;Feet supported Sitting balance-Leahy Scale: Fair     Standing balance support: No upper extremity supported;During functional activity Standing balance-Leahy Scale: Fair Standing balance comment: washing hands at sink with min guard assist                            Cognition Arousal/Alertness: Awake/alert Behavior During Therapy: Flat affect Overall Cognitive Status: Impaired/Different from baseline Area of Impairment: Safety/judgement;Attention;Following commands;Awareness;Memory;Problem solving                   Current Attention Level: Sustained Memory: Decreased short-term memory Following Commands: Follows one step commands inconsistently Safety/Judgement: Decreased awareness of safety;Decreased awareness of deficits Awareness: Intellectual Problem Solving: Slow processing;Decreased initiation;Requires verbal cues General Comments: Pt non-verbal. She follows  1-step commands > 75%  of the time, mild impulsivity. Fairly consistent yes/no responses. left sided neglect, but able to cross midline      Exercises      General Comments        Pertinent Vitals/Pain Pain Assessment: Faces Faces Pain Scale: No hurt    Home Living                      Prior Function            PT Goals (current goals can now be found in the care plan section) Acute Rehab PT Goals Patient Stated Goal: unable to state (non-verbal) PT Goal Formulation: With patient/family Time For Goal Achievement: 08/13/20 Potential to Achieve Goals: Good Progress towards PT goals: Progressing toward goals    Frequency    Min 4X/week      PT Plan Current plan remains appropriate    Co-evaluation PT/OT/SLP Co-Evaluation/Treatment: Yes Reason for Co-Treatment: Complexity of the patient's impairments (multi-system involvement);Necessary to address cognition/behavior during functional activity;For patient/therapist safety;To address functional/ADL transfers PT goals addressed during session: Mobility/safety with mobility        AM-PAC PT "6 Clicks" Mobility   Outcome Measure  Help needed turning from your back to your side while in a flat bed without using bedrails?: A Little Help needed moving from lying on your back to sitting on the side of a flat bed without using bedrails?: A Little Help needed moving to and from a bed to a chair (including a wheelchair)?: A Little Help needed standing up from a chair using your arms (e.g., wheelchair or bedside chair)?: A Little Help needed to walk in hospital room?: A Little Help needed climbing 3-5 steps with a railing? : A Lot 6 Click Score: 17    End of Session Equipment Utilized During Treatment: Gait belt Activity Tolerance: Patient tolerated treatment well Patient left: in chair;with call bell/phone within reach;with chair alarm set;with restraints reapplied Nurse Communication: Mobility status PT Visit  Diagnosis: Unsteadiness on feet (R26.81);Other abnormalities of gait and mobility (R26.89);Muscle weakness (generalized) (M62.81);Difficulty in walking, not elsewhere classified (R26.2);Other symptoms and signs involving the nervous system (R29.898);Hemiplegia and hemiparesis Hemiplegia - Right/Left: Left Hemiplegia - dominant/non-dominant: Non-dominant Hemiplegia - caused by: Cerebral infarction     Time: 4270-6237 PT Time Calculation (min) (ACUTE ONLY): 32 min  Charges:  $Therapeutic Activity: 8-22 mins                     Wyona Almas, PT, DPT Acute Rehabilitation Services Pager [phone] Office [phone]    Deno Etienne 08/02/2020, 10:48 AM

## 2020-08-02 NOTE — Progress Notes (Signed)
Patient resting comfortably on 2L [place]. No respiratory distress noted. BIPAP not needed at this time. RT will monitor as needed. 

## 2020-08-02 NOTE — Progress Notes (Signed)
Occupational Therapy Treatment Patient Details Name: Jennifer Lucero MRN: [id number] DOB: 04/13/1950 Today's Date: 08/02/2020    History of present illness Jennifer Lucero is a 70 y.o. female who presented 7/6 with left-sided weakness that started abruptly and sudden onset of rightward gaze.CT/CT angiogram demonstrated right M2 occlusion and previous left-sided stroke. MRI: Evolving acute to early subacute right MCA distribution infarct.  Pt s/p RT common carotid arteriogram followed by complete revascularization of occluded RT MCA. PHMx: atrial fibrillation, diabetes, hypertension, hyperlipidemia, left below elbow amputation.   OT comments  Pt showing improvements today and requires multimodal cues to complete task, following commands 75% of time and using L side more than upon OT eval. Pt's vision appears to be improving with direct eye contact and use of compensatory strategies to look to L with cues 50% of the time. Pt not verbalizing or making attempt; only gestures- slapping her stomach and moving BUEs. Pt performing ADL tasks in sitting and standing with set-upA to modA overall. Pt minA +2 for stability with mobility. Pt would benefit from continued OT skilled services. OT following.    Follow Up Recommendations  CIR;Supervision/Assistance - 24 hour    Equipment Recommendations  3 in 1 bedside commode    Recommendations for Other Services      Precautions / Restrictions Precautions Precautions: Fall Precaution Comments: Cortrak, posey, L below elbow amputation hx       Mobility Bed Mobility Overal bed mobility: Needs Assistance Bed Mobility: Supine to Sit     Supine to sit: Min assist     General bed mobility comments: Pt with legs hanging off bed upon entry, minA at trunk to sit up onto left side of bed towards L side.    Transfers Overall transfer level: Needs assistance Equipment used: 2 person hand held assist Transfers: Sit to/from Stand Sit to Stand: Min  assist;+2 physical assistance         General transfer comment: Pt standing from edge of bed, toilet and recliner with minA, narrow BOS    Balance Overall balance assessment: Needs assistance Sitting-balance support: No upper extremity supported;Feet supported Sitting balance-Leahy Scale: Fair     Standing balance support: No upper extremity supported;During functional activity Standing balance-Leahy Scale: Fair Standing balance comment: washing hands at sink with min guard assist                           ADL either performed or assessed with clinical judgement   ADL Overall ADL's : Needs assistance/impaired Eating/Feeding: NPO   Grooming: Moderate assistance;Sitting;Minimal assistance;Standing Grooming Details (indicate cue type and reason): combing hair mostly R side; washing face mostly R side; cues required to attend to L side of face. stnading at sink for light grooming, mildly perseverative for washing R hand                 Toilet Transfer: Minimal assistance;+2 for physical assistance;Ambulation Toilet Transfer Details (indicate cue type and reason): Bil HHA Toileting- Clothing Manipulation and Hygiene: Moderate assistance;Sit to/from stand Toileting - Clothing Manipulation Details (indicate cue type and reason): Pt using RUE to attempt in standing at commode     Functional mobility during ADLs: Minimal assistance;+2 for physical assistance;Cueing for safety;Cueing for sequencing General ADL Comments: Pt showing improvements today and requires multimodal cues to complete task, following commands 75% of time and using L side more than upon OT eval. Pt's vision appears to be improving with direct eye contact  and use of compensatory strategies to look to L with cues 50% of the time.     Vision   Vision Assessment?: Vision impaired- to be further tested in functional context Additional Comments: L neglect and inattention; scanning environment; continue to  assess; crossing midline   Perception     Praxis      Cognition Arousal/Alertness: Awake/alert Behavior During Therapy: Flat affect Overall Cognitive Status: Impaired/Different from baseline Area of Impairment: Safety/judgement;Attention;Following commands;Awareness;Memory;Problem solving                   Current Attention Level: Sustained Memory: Decreased short-term memory Following Commands: Follows one step commands inconsistently Safety/Judgement: Decreased awareness of safety;Decreased awareness of deficits Awareness: Intellectual Problem Solving: Slow processing;Decreased initiation;Requires verbal cues General Comments: Pt not verbalizing or making attempt; only gestures- slapping her stomach and moving BUEs. She follows 1-step commands > 75% of the time, mild impulsivity. Inconsistent yes/no responses. Left sided neglect, but able to cross midline visually.        Exercises     Shoulder Instructions       General Comments      Pertinent Vitals/ Pain       Pain Assessment: Faces Faces Pain Scale: No hurt Pain Intervention(s): Monitored during session  Home Living                                          Prior Functioning/Environment              Frequency  Min 2X/week        Progress Toward Goals  OT Goals(current goals can now be found in the care plan section)  Progress towards OT goals: Progressing toward goals  Acute Rehab OT Goals Patient Stated Goal: unable to state (non-verbal) OT Goal Formulation: Patient unable to participate in goal setting Time For Goal Achievement: 08/13/20 Potential to Achieve Goals: Good  Plan Discharge plan remains appropriate    Co-evaluation    PT/OT/SLP Co-Evaluation/Treatment: Yes Reason for Co-Treatment: Complexity of the patient's impairments (multi-system involvement);To address functional/ADL transfers   OT goals addressed during session: Strengthening/ROM;ADL's and  self-care      AM-PAC OT "6 Clicks" Daily Activity     Outcome Measure   Help from another person eating meals?: Total Help from another person taking care of personal grooming?: A Lot Help from another person toileting, which includes using toliet, bedpan, or urinal?: A Lot Help from another person bathing (including washing, rinsing, drying)?: A Lot Help from another person to put on and taking off regular upper body clothing?: A Lot Help from another person to put on and taking off regular lower body clothing?: A Lot 6 Click Score: 11    End of Session Equipment Utilized During Treatment: Gait belt  OT Visit Diagnosis: Unsteadiness on feet (R26.81);Other abnormalities of gait and mobility (R26.89);Muscle weakness (generalized) (M62.81);Other symptoms and signs involving cognitive function;Cognitive communication deficit (R41.841) Symptoms and signs involving cognitive functions: Nontraumatic intracerebral hemorrhage   Activity Tolerance Patient tolerated treatment well   Patient Left in chair;with call bell/phone within reach;with chair alarm set   Nurse Communication Mobility status        Time: 9833-8250 OT Time Calculation (min): 34 min  Charges: OT General Charges $OT Visit: 1 Visit OT Treatments $Self Care/Home Management : 8-22 mins  Jefferey Pica, OTR/L Acute Rehabilitation Services Pager: [phone] Office: [phone]  Javius Sylla J Takera Rayl 08/02/2020, 8:09 PM

## 2020-08-02 NOTE — Plan of Care (Signed)
  Problem: Safety: Goal: Non-violent Restraint(s) Outcome: Progressing   Problem: Clinical Measurements: Goal: Respiratory complications will improve Outcome: Progressing Goal: Cardiovascular complication will be avoided Outcome: Progressing   Problem: Activity: Goal: Risk for activity intolerance will decrease Outcome: Progressing   Problem: Nutrition: Goal: Adequate nutrition will be maintained Outcome: Progressing   Problem: Elimination: Goal: Will not experience complications related to bowel motility Outcome: Progressing Goal: Will not experience complications related to urinary retention Outcome: Progressing

## 2020-08-02 NOTE — Progress Notes (Signed)
PROGRESS NOTE    Jennifer Lucero  SWF:[id number] DOB: [birth year] DOA: 07/28/2020 PCP: Ocie Doyne., MD   Brief Narrative: Jennifer Lucero is a 70 y.o. female with a history of atrial fibrillation, diabetes mellitus, hypertension, hyperlipidemia. Patient presented secondary to left sided weakness and found to have right M2 occlusion leading to right MCA stroke. She was transferred to [facility] for IR mechanical thrombectomy with TICI 3 reperfusion. While admitted, she had an episode concerning for seizure activity and started on Keppra. Stroke has left patient with dysphagia/aphasia/left hemiplegia resulting in need for NG tube and possible need gor PEG tube placement. Plan for CIR discharge.   Assessment & Plan:   Active Problems:   Stroke (cerebrum) (HCC)   Middle cerebral artery embolism, right   Acute ischemic right MCA stroke (HCC)   Acute right MCA stroke with resultant aphasia, dysphagia, left hemiplegia Secondary to right M2 occlusion. S/p mechanical thrombectomy with TICI 3 reperfusion. Stroke is in setting of known atrial fibrillation on Eliquis. CT head was consistent with right MCA stroke and old left MCA infarct. CTA head/neck consistent with right M2 occlusion. Transthoracic Echocardiogram significant for no embolic source. LDL of 65. Hemoglobin A1C of 9.6%. PT/OT recommending inpatient rehabilitation. -Neurology recommendations -PT/OT recommendations: CIR -Tube feeds via Cortrak; may need PEG tube but this can be decided later if patient goes to CIR  Acute respiratory failure with hypoxia Occurred post-extubation from procedure. Patient required a short course of BiPAP. Currently weaned to 2 lpm of oxygen -Wean to room air  Seizures Episode of seizure activity noted on 7/7. EEG without evidence of seizure activity. Patient was started on Keppra 500 mg BID -Continue Keppra 500 mg BID  Paroxysmal atrial fibrillation Patient was previously on Eliquis which has been  discontinued secondary to large stroke with hemorrhagic transformation. Currently on Aspirin 325 mg with recommend to restart Eliquis in 10-14 days from 7/8.  Primary hypertension Patient is on Maxide and Imdur as an outpatient -Continue isosorbide dinitrate, metoprolol  Hyperlipidemia -Continue pravastatin  Diabetes mellitus, type 2 Diabetic neuropathy Hemoglobin A1C of 9.6. Patient is on metformin, Levemir and glimepiride as an outpatient. Started on Lantus and SSI in patient -Continue Lantus and SSI -Recommend to discontinue glimepiride on discharge  Leukocytosis Mildly elevated WBC which has trended down.  Delirium Started on Seroquel -Continue Seroquel qhs  History of pulmonary embolism Back from 2019. Was previously on Eliquis.   DVT prophylaxis: Heparin subq Code Status:   Code Status: Full Code Family Communication: None at bedside Disposition Plan: Per primary. Plan for discharge to CIR   Consultants:  PCCM General medicine  Procedures:  RT common carotid arteriogram followed by complete revascularization of occluded RT MCA (7/6)  Antimicrobials: None    Subjective: Patient shakes her head no when asked about any concerns today or any pain. No questions.  Objective: Vitals:   08/02/20 0500 08/02/20 0600 08/02/20 0700 08/02/20 0800  BP: (!) 168/73 (!) 162/118 (!) 149/73 (!) 153/77  Pulse: 69 61 68 63  Resp: 14 16 16 17   Temp:    98.5 F (36.9 C)  TempSrc:    Oral  SpO2: 94% 93% 99% 98%  Weight:        Intake/Output Summary (Last 24 hours) at 08/02/2020 0850 Last data filed at 08/02/2020 0800 Gross per 24 hour  Intake 1845.52 ml  Output 2100 ml  Net -254.48 ml   Filed Weights   07/29/20 0300 08/01/20 0400 08/02/20 0409  Weight:  98 kg 101.6 kg 102 kg    Examination:  General exam: Appears calm and comfortable Respiratory system: Clear to auscultation. Respiratory effort normal. Cardiovascular system: S1 & S2 heard, RRR. No murmurs, rubs,  gallops or clicks. Gastrointestinal system: Abdomen is nondistended, soft and nontender. No organomegaly or masses felt. Normal bowel sounds heard. Central nervous system: Alert. Aphasia.  Musculoskeletal: No calf tenderness. Left hand amputation noted Skin: No cyanosis. No rashes    Data Reviewed: I have personally reviewed following labs and imaging studies  CBC Lab Results  Component Value Date   WBC 10.6 (H) 08/02/2020   RBC 3.55 (L) 08/02/2020   HGB 10.2 (L) 08/02/2020   HCT 31.8 (L) 08/02/2020   MCV 89.6 08/02/2020   MCH 28.7 08/02/2020   PLT 311 08/02/2020   MCHC 32.1 08/02/2020   RDW 15.1 08/02/2020   LYMPHSABS 1.9 07/29/2020   MONOABS 1.6 (H) 07/29/2020   EOSABS 0.0 07/29/2020   BASOSABS 0.0 63/01/6008     Last metabolic panel Lab Results  Component Value Date   NA 141 08/02/2020   K 4.4 08/02/2020   CL 105 08/02/2020   CO2 28 08/02/2020   BUN 25 (H) 08/02/2020   CREATININE 1.04 (H) 08/02/2020   GLUCOSE 121 (H) 08/02/2020   GFRNONAA 57 (L) 08/02/2020   GFRAA 58 (L) 10/24/2018   CALCIUM 9.3 08/02/2020   PHOS 5.1 (H) 08/02/2020   PROT 6.1 (L) 07/28/2020   ALBUMIN 3.1 (L) 07/28/2020   BILITOT 0.7 07/28/2020   ALKPHOS 38 07/28/2020   AST 41 07/28/2020   ALT 18 07/28/2020   ANIONGAP 8 08/02/2020    CBG (last 3)  Recent Labs    08/01/20 2315 08/02/20 0323 08/02/20 0751  GLUCAP 127* 98 161*     GFR: Estimated Creatinine Clearance: 56.8 mL/min (A) (by C-G formula based on SCr of 1.04 mg/dL (H)).  Coagulation Profile: No results for input(s): INR, PROTIME in the last 168 hours.  Recent Results (from the past 240 hour(s))  Resp Panel by RT-PCR (Flu A&B, Covid) Nasal Mucosa     Status: None   Collection Time: 07/28/20  1:11 PM   Specimen: Nasal Mucosa; Nasopharyngeal(NP) swabs in vial transport medium  Result Value Ref Range Status   SARS Coronavirus 2 by RT PCR NEGATIVE NEGATIVE Final    Comment: (NOTE) SARS-CoV-2 target nucleic acids are NOT  DETECTED.  The SARS-CoV-2 RNA is generally detectable in upper respiratory specimens during the acute phase of infection. The lowest concentration of SARS-CoV-2 viral copies this assay can detect is 138 copies/mL. A negative result does not preclude SARS-Cov-2 infection and should not be used as the sole basis for treatment or other patient management decisions. A negative result may occur with  improper specimen collection/handling, submission of specimen other than nasopharyngeal swab, presence of viral mutation(s) within the areas targeted by this assay, and inadequate number of viral copies(<138 copies/mL). A negative result must be combined with clinical observations, patient history, and epidemiological information. The expected result is Negative.  Fact Sheet for Patients:  EntrepreneurPulse.com.au  Fact Sheet for Healthcare Providers:  IncredibleEmployment.be  This test is no t yet approved or cleared by the [place] FDA and  has been authorized for detection and/or diagnosis of SARS-CoV-2 by FDA under an Emergency Use Authorization (EUA). This EUA will remain  in effect (meaning this test can be used) for the duration of the COVID-19 declaration under Section 564(b)(1) of the Act, 21 U.S.C.section 360bbb-3(b)(1), unless the authorization is  terminated  or revoked sooner.       Influenza A by PCR NEGATIVE NEGATIVE Final   Influenza B by PCR NEGATIVE NEGATIVE Final    Comment: (NOTE) The Xpert Xpress SARS-CoV-2/FLU/RSV plus assay is intended as an aid in the diagnosis of influenza from Nasopharyngeal swab specimens and should not be used as a sole basis for treatment. Nasal washings and aspirates are unacceptable for Xpert Xpress SARS-CoV-2/FLU/RSV testing.  Fact Sheet for Patients: EntrepreneurPulse.com.au  Fact Sheet for Healthcare Providers: IncredibleEmployment.be  This test is not yet  approved or cleared by the [place] FDA and has been authorized for detection and/or diagnosis of SARS-CoV-2 by FDA under an Emergency Use Authorization (EUA). This EUA will remain in effect (meaning this test can be used) for the duration of the COVID-19 declaration under Section 564(b)(1) of the Act, 21 U.S.C. section 360bbb-3(b)(1), unless the authorization is terminated or revoked.  Performed at [facility], [place] [address], [place], [place] [address]   MRSA Next Gen by PCR, Nasal     Status: None   Collection Time: 07/28/20  5:08 PM   Specimen: Nasal Mucosa; Nasal Swab  Result Value Ref Range Status   MRSA by PCR Next Gen NOT DETECTED NOT DETECTED Final    Comment: (NOTE) The GeneXpert MRSA Assay (FDA approved for NASAL specimens only), is one component of a comprehensive MRSA colonization surveillance program. It is not intended to diagnose MRSA infection nor to guide or monitor treatment for MRSA infections. Test performance is not FDA approved in patients less than 81 years old. Performed at [facility], [place] [address] [place], [place] [address]         Radiology Studies: No results found.      Scheduled Meds:  aspirin  325 mg Per Tube Daily   chlorhexidine  15 mL Mouth Rinse BID   Chlorhexidine Gluconate Cloth  6 each Topical Q0600   feeding supplement (PROSource TF)  45 mL Per Tube Daily   heparin injection (subcutaneous)  5,000 Units Subcutaneous Q8H   insulin aspart  0-15 Units Subcutaneous Q4H   insulin aspart  2 Units Subcutaneous Q4H   insulin glargine  20 Units Subcutaneous Daily   isosorbide dinitrate  60 mg Per Tube BID   levETIRAcetam  500 mg Per Tube BID   lisinopril  20 mg Per Tube Daily   mouth rinse  15 mL Mouth Rinse q12n4p   metoprolol tartrate  25 mg Per Tube BID   multivitamin with minerals  1 tablet Per Tube Daily   pneumococcal 23 valent vaccine  0.5 mL Intramuscular Tomorrow-1000   pravastatin  10 mg Per Tube Daily    QUEtiapine  25 mg Oral QHS   Continuous Infusions:  sodium chloride 25 mL/hr at 08/02/20 0800   feeding supplement (GLUCERNA 1.5 CAL) 1,000 mL (08/01/20 1159)     LOS: 5 days     Cordelia Poche, MD [facility] 08/02/2020, 8:50 AM  If 7PM-7AM, please contact night-coverage [email or web address]

## 2020-08-02 NOTE — Progress Notes (Addendum)
STROKE TEAM PROGRESS NOTE   INTERVAL HISTORY RN and husband are at the bedside. Out of bed to chair, pleasant, smiling and pantomiming, but seems to have an element of receptive aphasia also. Unable to speak any actual words. Awaiting transfer to neuro floor. Vitals:   08/02/20 1001 08/02/20 1200 08/02/20 1300 08/02/20 1400  BP: (!) 156/108 (!) 123/96 127/62 (!) 141/77  Pulse: 75 68 64 66  Resp:  19 12 (!) 23  Temp:  98.7 F (37.1 C)    TempSrc:  Oral    SpO2:  90% 90% 99%  Weight:       CBC:  Recent Labs  Lab 07/29/20 0455 07/30/20 0512 08/01/20 0126 08/02/20 0342  WBC 14.6*   < > 10.4 10.6*  NEUTROABS 10.9*  --   --   --   HGB 9.8*   < > 9.6* 10.2*  HCT 31.5*   < > 30.8* 31.8*  MCV 90.8   < > 90.6 89.6  PLT 332   < > 282 311   < > = values in this interval not displayed.    Basic Metabolic Panel:  Recent Labs  Lab 08/01/20 0126 08/02/20 0342  NA 138 141  K 4.1 4.4  CL 102 105  CO2 27 28  GLUCOSE 151* 121*  BUN 22 25*  CREATININE 1.04* 1.04*  CALCIUM 9.0 9.3  MG 2.0 2.0  PHOS 4.2 5.1*    Lipid Panel:  Recent Labs  Lab 07/29/20 0455  CHOL 129  TRIG 84  HDL 47  CHOLHDL 2.7  VLDL 17  LDLCALC 65    HgbA1c:  Recent Labs  Lab 07/29/20 0455  HGBA1C 9.6*    Urine Drug Screen: No results for input(s): LABOPIA, COCAINSCRNUR, LABBENZ, AMPHETMU, THCU, LABBARB in the last 168 hours.  Alcohol Level No results for input(s): ETH in the last 168 hours.  IMAGING past 24 hours CT HEAD WO CONTRAST  Result Date: 07/29/2020 IMPRESSION:  1. Right insular and peri-insular acute infarct.  2. Increased subarachnoid density at the right vertex, presumably diluted subarachnoid blood.   CT HEAD WO CONTRAST  Result Date: 07/28/2020 IMPRESSION:  1. Expected evolution of right MCA territory infarct without hemorrhagic conversion or mass effect.  2. Old left MCA infarct.   PHYSICAL EXAM  Temp:  [97.8 F (36.6 C)-98.7 F (37.1 C)] 98.7 F (37.1 C) (07/11  1200) Pulse Rate:  [61-78] 66 (07/11 1400) Resp:  [12-26] 23 (07/11 1400) BP: (123-168)/(62-118) 141/77 (07/11 1400) SpO2:  [90 %-100 %] 99 % (07/11 1400) Weight:  [102 kg] 102 kg (07/11 0409)  General - Well nourished, well developed, no acute distress or agitation today Cardiovascular - irregularly irregular heart rate and rhythm, on tele sinus arrhythmia but not in afib.  Neuro - Alert, follows most commands, needs some prompting and at times she misses verbal que's. May have some receptive aphasia. Remains nonverbal. Not able to name or repeat. Eyes tracking bilaterally, no gaze palsy, blinking to visual threat on the right but not consistent on the left, PERRL. Left facial droop. Tongue protrusion not cooperative. RUEs 4/5, and LUE 3/5, left UE below elbow amputation. BLE proximal 3/5 and distal not cooperative on exam. Sensation and coordination not cooperative, gait not tested.    ASSESSMENT/PLAN [name] is a 70 y.o. female with history of A-fib on eliquis, DM2, HTN, HLD presenting with left sided weakness and rightward gaze preference.  CT head showed acute right MCA territory infarct.  CTA  showed right M2 occlusion.  Now s/p thrombectomy with complete revascularization TICI 3.   Stroke - right MCA stroke secondary to right M2 occlusion s/p mechanical thrombectomy with TICI3 reperfusion in the setting of afib on Eliquis CT head acute right MCA stroke and old left MCA infarct.  CTA head & neck: right M2 occlusion S/p IR with TICI3 Repeat CT head: Right insular and peri-insular acute infarct. Increased subarachnoid density at the right vertex. MRI  large right MCA infarct with petechial HT. Old left frontal and cerebellar infarcts 2D Echo EF 55-60% LDL 65 HgbA1c 9.6 VTE prophylaxis - SCDs aspirin 81 mg daily and Eliquis (apixaban) daily prior to admission, now on ASA 325mg . Consider to resume eliquis in 10-14 days given large infarct with Hemorrhagic transformation.   Therapy recommendations: CIR Disposition:  pending  Seizures Seizure activity at approximately 3 am 7/7 Loaded with Keppra 2 grams IV EEG no seizure but cortical dysfunction at right hemisphere, moderate diffuse encephalopathy Continue Keppra 500 mg bid Monitor seizure activity  PAF On eliquis PTA Now eliquis on hold given large right MCA Infarct with HT On ASA 325 now Consider to resume elqiuis in 10-14 days  Respiratory difficulty Extubated but difficulty controlling secretions So far tolerating extubation well CCM on board for re-intubation if needed  Hypertension Home meds:  imdur 120mg  daily, lisinopril 20mg  daily, toprol XL 50mg  daily, Maxzide 37.5/25 Stable but on the high end Resume metoprolol and lisinopril SBP goal <160 given HT Hydralazine and labetalol prn Long-term BP goal normotensive  Hyperlipidemia Home meds:  Pravastatin 10mg  daily LDL 65, goal < 70  resumed pravastatin 10, no high intensity statin due to LDL at goal and advanced age Continue statin at discharge  Diabetes type II Uncontrolled Home meds:  levemir, metformin 500mg  bid, glimepiride 4mg  daily HgbA1c 9.6, goal < 7.0 CBGs SSI Now on lantus PCP follow up closely  Leukocytosis, improving WBC 12.6-14.6-12.1-12.6-10.4-10.6 UA negative CXR negative Close CBC monitoring  Dysphagia Did not pass swallow Cortrak placed On tube feeding @ 50 and IV fluid @ 25 Cre 1.21-1.30-1.08-1.04-1.04  Agitation and delirium, improving Doing better overnight Off restrain On seroquel 25 Qhs Close monitoring  Other Stroke Risk Factors Advanced Age >/= 44  Remote ETOH use and drug use Obesity, Body mass index is 38.6 kg/m., BMI >/= 30 associated with increased stroke risk, recommend weight loss, diet and exercise as appropriate  Hx stroke: left MCA infact, May 2019  Other Active Problems Diabetic neuropathy  GERD: nexium 40mg  daily Hx of pulmonary embolus (11/01/2017) Splenic  infarction anxiety  Hospital day # [place], ARNP-C, ANVP-BC Pager: [phone]  Stroke Neurology 08/02/2020 3:32 PM  ATTENDING NOTE: I reviewed above note and agree with the assessment and plan. Pt was seen and examined.   Husband and RN are at bedside.  Patient sitting in chair, awake alert, smiling, using comb on the right hand.  Still nonverbal, follows limited simple commands.  Moving all extremities.  Still has mild difficulty of secretion handling, however no need of reintubation, transfer out of ICU.  Continue aspirin 81 and pravastatin 10.  Continue Keppra, no seizure activity.  WBC and creatinine stable.  Delirium at night much improved.  PT/OT recommend CIR.  For detailed assessment and plan, please refer to above as I have made changes wherever appropriate.   Rosalin Hawking, MD PhD Stroke Neurology 08/02/2020 6:29 PM    To contact Stroke Continuity provider, please refer to [email or web address] After hours, contact General Neurology

## 2020-08-03 DIAGNOSIS — I6601 Occlusion and stenosis of right middle cerebral artery: Secondary | ICD-10-CM | POA: Diagnosis not present

## 2020-08-03 DIAGNOSIS — E1165 Type 2 diabetes mellitus with hyperglycemia: Secondary | ICD-10-CM | POA: Diagnosis not present

## 2020-08-03 DIAGNOSIS — J9601 Acute respiratory failure with hypoxia: Secondary | ICD-10-CM | POA: Diagnosis not present

## 2020-08-03 DIAGNOSIS — I480 Paroxysmal atrial fibrillation: Secondary | ICD-10-CM | POA: Diagnosis not present

## 2020-08-03 LAB — GLUCOSE, CAPILLARY
Glucose-Capillary: 125 mg/dL — ABNORMAL HIGH (ref 70–99)
Glucose-Capillary: 167 mg/dL — ABNORMAL HIGH (ref 70–99)
Glucose-Capillary: 168 mg/dL — ABNORMAL HIGH (ref 70–99)
Glucose-Capillary: 169 mg/dL — ABNORMAL HIGH (ref 70–99)
Glucose-Capillary: 169 mg/dL — ABNORMAL HIGH (ref 70–99)
Glucose-Capillary: 171 mg/dL — ABNORMAL HIGH (ref 70–99)
Glucose-Capillary: 203 mg/dL — ABNORMAL HIGH (ref 70–99)

## 2020-08-03 LAB — BASIC METABOLIC PANEL
Anion gap: 8 (ref 5–15)
BUN: 27 mg/dL — ABNORMAL HIGH (ref 8–23)
CO2: 30 mmol/L (ref 22–32)
Calcium: 9.7 mg/dL (ref 8.9–10.3)
Chloride: 104 mmol/L (ref 98–111)
Creatinine, Ser: 1.06 mg/dL — ABNORMAL HIGH (ref 0.44–1.00)
GFR, Estimated: 56 mL/min — ABNORMAL LOW (ref >60)
Glucose, Bld: 130 mg/dL — ABNORMAL HIGH (ref 70–99)
Potassium: 4.3 mmol/L (ref 3.5–5.1)
Sodium: 142 mmol/L (ref 135–145)

## 2020-08-03 LAB — CBC
HCT: 32 % — ABNORMAL LOW (ref 36.0–46.0)
Hemoglobin: 10.2 g/dL — ABNORMAL LOW (ref 12.0–15.0)
MCH: 29.1 pg (ref 26.0–34.0)
MCHC: 31.9 g/dL (ref 30.0–36.0)
MCV: 91.4 fL (ref 80.0–100.0)
Platelets: 387 10*3/uL (ref 150–400)
RBC: 3.5 MIL/uL — ABNORMAL LOW (ref 3.87–5.11)
RDW: 15.2 % (ref 11.5–15.5)
WBC: 11.2 10*3/uL — ABNORMAL HIGH (ref 4.0–10.5)
nRBC: 0 % (ref 0.0–0.2)

## 2020-08-03 LAB — PHOSPHORUS: Phosphorus: 4.4 mg/dL (ref 2.5–4.6)

## 2020-08-03 LAB — MAGNESIUM: Magnesium: 1.9 mg/dL (ref 1.7–2.4)

## 2020-08-03 NOTE — Progress Notes (Signed)
Physical Therapy Treatment Patient Details Name: Jennifer Lucero MRN: [id number] DOB: 12-Aug-1950 Today's Date: 08/03/2020    History of Present Illness Jennifer Lucero is a 70 y.o. female who presented 7/6 with left-sided weakness that started abruptly and sudden onset of rightward gaze.CT/CT angiogram demonstrated right M2 occlusion and previous left-sided stroke. MRI: Evolving acute to early subacute right MCA distribution infarct.  Pt s/p RT common carotid arteriogram followed by complete revascularization of occluded RT MCA. PHMx: atrial fibrillation, diabetes, hypertension, hyperlipidemia, left below elbow amputation.    PT Comments    Pt progressing well towards her physical therapy goals. Ambulating 20 ft, then an additional 30 ft with moderate assist (+2 chair follow/safety). Pt demonstrates significant left sided neglect, aphasia, balance deficits, and decreased cognition. Continue to recommend comprehensive inpatient rehab (CIR) for post-acute therapy needs.    Follow Up Recommendations  CIR;Supervision/Assistance - 24 hour     Equipment Recommendations  3in1 (PT);Wheelchair (measurements PT);Wheelchair cushion (measurements PT)    Recommendations for Other Services Rehab consult     Precautions / Restrictions Precautions Precautions: Fall Precaution Comments: Cortrak, posey, L below elbow amputation hx, L neglect Restrictions Weight Bearing Restrictions: No    Mobility  Bed Mobility Overal bed mobility: Needs Assistance Bed Mobility: Supine to Sit     Supine to sit: Mod assist;+2 for safety/equipment     General bed mobility comments: Pt with increased difficulty exiting out of left side of the bed, requiring assist for BLE and trunk to sitting position    Transfers Overall transfer level: Needs assistance Equipment used: 2 person hand held assist Transfers: Sit to/from Stand Sit to Stand: Min assist;+2 safety/equipment         General transfer comment:  MinA to rise from edge of bed and recliner multiple times  Ambulation/Gait Ambulation/Gait assistance: Mod assist;+2 safety/equipment Gait Distance (Feet): 50 Feet (20 ft, 30 ft) Assistive device: 2 person hand held assist Gait Pattern/deviations: Step-through pattern;Decreased stride length Gait velocity: reduced Gait velocity interpretation: <1.8 ft/sec, indicate of risk for recurrent falls General Gait Details: Pt requiring modA for balance with chair follow, demonstrates left sided neglect with right drift into obstacles despite max multimodal cues.   Stairs             Wheelchair Mobility    Modified Rankin (Stroke Patients Only) Modified Rankin (Stroke Patients Only) Pre-Morbid Rankin Score: No symptoms Modified Rankin: Moderately severe disability     Balance Overall balance assessment: Needs assistance Sitting-balance support: No upper extremity supported;Feet supported Sitting balance-Leahy Scale: Fair     Standing balance support: No upper extremity supported;During functional activity Standing balance-Leahy Scale: Fair                              Cognition Arousal/Alertness: Awake/alert Behavior During Therapy: Flat affect Overall Cognitive Status: Impaired/Different from baseline Area of Impairment: Safety/judgement;Attention;Following commands;Awareness;Memory;Problem solving                   Current Attention Level: Sustained Memory: Decreased short-term memory Following Commands: Follows one step commands inconsistently Safety/Judgement: Decreased awareness of safety;Decreased awareness of deficits Awareness: Intellectual Problem Solving: Slow processing;Decreased initiation;Requires verbal cues General Comments: Pt non-verbal. She follows 1-step commands 50-75% of the time, left sided neglect, inconsistent yes/no responses      Exercises      General Comments        Pertinent Vitals/Pain Pain Assessment: Faces Faces  Pain Scale:  No hurt    Home Living                      Prior Function            PT Goals (current goals can now be found in the care plan section) Acute Rehab PT Goals Patient Stated Goal: unable to state (non-verbal) PT Goal Formulation: With patient/family Time For Goal Achievement: 08/13/20 Potential to Achieve Goals: Good Progress towards PT goals: Progressing toward goals    Frequency    Min 4X/week      PT Plan Current plan remains appropriate    Co-evaluation              AM-PAC PT "6 Clicks" Mobility   Outcome Measure  Help needed turning from your back to your side while in a flat bed without using bedrails?: A Little Help needed moving from lying on your back to sitting on the side of a flat bed without using bedrails?: A Lot Help needed moving to and from a bed to a chair (including a wheelchair)?: A Little Help needed standing up from a chair using your arms (e.g., wheelchair or bedside chair)?: A Little Help needed to walk in hospital room?: A Lot Help needed climbing 3-5 steps with a railing? : A Lot 6 Click Score: 15    End of Session Equipment Utilized During Treatment: Gait belt Activity Tolerance: Patient tolerated treatment well Patient left: in chair;with call bell/phone within reach;with chair alarm set;with restraints reapplied Nurse Communication: Mobility status PT Visit Diagnosis: Unsteadiness on feet (R26.81);Other abnormalities of gait and mobility (R26.89);Muscle weakness (generalized) (M62.81);Difficulty in walking, not elsewhere classified (R26.2);Other symptoms and signs involving the nervous system (R29.898);Hemiplegia and hemiparesis Hemiplegia - Right/Left: Left Hemiplegia - dominant/non-dominant: Non-dominant Hemiplegia - caused by: Cerebral infarction     Time: 0347-4259 PT Time Calculation (min) (ACUTE ONLY): 24 min  Charges:  $Therapeutic Activity: 23-37 mins                     Wyona Almas, PT,  DPT Acute Rehabilitation Services Pager [phone] Office [phone]    Deno Etienne 08/03/2020, 3:10 PM

## 2020-08-03 NOTE — Progress Notes (Addendum)
STROKE TEAM PROGRESS NOTE   INTERVAL HISTORY No one at the bedside. Not as bright today or as able to follow pantomiming. Unable to speak any actual words still. Awaiting transfer to neuro floor.  Vitals:   08/03/20 1000 08/03/20 1100 08/03/20 1200 08/03/20 1300  BP: (!) 161/73 133/65 137/70 (!) 127/55  Pulse: 71 70 69 67  Resp: 20 20 19 17   Temp:   98.5 F (36.9 C)   TempSrc:   Axillary   SpO2: 96% 94% 97% 97%  Weight:       CBC:  Recent Labs  Lab 07/29/20 0455 07/30/20 0512 08/02/20 0342 08/03/20 0256  WBC 14.6*   < > 10.6* 11.2*  NEUTROABS 10.9*  --   --   --   HGB 9.8*   < > 10.2* 10.2*  HCT 31.5*   < > 31.8* 32.0*  MCV 90.8   < > 89.6 91.4  PLT 332   < > 311 387   < > = values in this interval not displayed.    Basic Metabolic Panel:  Recent Labs  Lab 08/02/20 0342 08/03/20 0256  NA 141 142  K 4.4 4.3  CL 105 104  CO2 28 30  GLUCOSE 121* 130*  BUN 25* 27*  CREATININE 1.04* 1.06*  CALCIUM 9.3 9.7  MG 2.0 1.9  PHOS 5.1* 4.4    Lipid Panel:  Recent Labs  Lab 07/29/20 0455  CHOL 129  TRIG 84  HDL 47  CHOLHDL 2.7  VLDL 17  LDLCALC 65    HgbA1c:  Recent Labs  Lab 07/29/20 0455  HGBA1C 9.6*    Urine Drug Screen: No results for input(s): LABOPIA, COCAINSCRNUR, LABBENZ, AMPHETMU, THCU, LABBARB in the last 168 hours.  Alcohol Level No results for input(s): ETH in the last 168 hours.  IMAGING past 24 hours CT HEAD WO CONTRAST  Result Date: 07/29/2020 IMPRESSION:  1. Right insular and peri-insular acute infarct.  2. Increased subarachnoid density at the right vertex, presumably diluted subarachnoid blood.   CT HEAD WO CONTRAST  Result Date: 07/28/2020 IMPRESSION:  1. Expected evolution of right MCA territory infarct without hemorrhagic conversion or mass effect.  2. Old left MCA infarct.   PHYSICAL EXAM  Temp:  [98.1 F (36.7 C)-99.2 F (37.3 C)] 98.5 F (36.9 C) (07/12 1200) Pulse Rate:  [65-80] 67 (07/12 1300) Resp:  [13-22] 17  (07/12 1300) BP: (123-169)/(53-102) 127/55 (07/12 1300) SpO2:  [91 %-99 %] 97 % (07/12 1300) Weight:  [97.3 kg] 97.3 kg (07/12 0500)  General - Well nourished, well developed, no acute distress or agitation today Cardiovascular - irregularly irregular heart rate and rhythm, on tele sinus arrhythmia but not in afib.  Neuro - Alert, follows most commands, needs some prompting and at times she misses verbal que's. May have some receptive aphasia. Remains nonverbal. Not able to name or repeat. Eyes tracking bilaterally, no gaze palsy, blinking to visual threat on the right but not consistent on the left, PERRL. Left facial droop. Tongue protrusion not cooperative. RUEs 4/5, and LUE 3/5, left UE below elbow amputation. BLE proximal 3/5 and distal not cooperative on exam. Sensation and coordination not cooperative, gait not tested.    ASSESSMENT/PLAN [name] is a 70 y.o. female with history of A-fib on eliquis, DM2, HTN, HLD presenting with left sided weakness and rightward gaze preference.  CT head showed acute right MCA territory infarct.  CTA showed right M2 occlusion.  Now s/p thrombectomy with complete revascularization  TICI 3.   Stroke - right MCA stroke secondary to right M2 occlusion s/p mechanical thrombectomy with TICI3 reperfusion in the setting of afib on Eliquis CT head acute right MCA stroke and old left MCA infarct.  CTA head & neck: right M2 occlusion S/p IR with TICI3 Repeat CT head: Right insular and peri-insular acute infarct. Increased subarachnoid density at the right vertex. MRI  large right MCA infarct with petechial HT. Old left frontal and cerebellar infarcts 2D Echo EF 55-60% LDL 65 HgbA1c 9.6 VTE prophylaxis - SCDs aspirin 81 mg daily and Eliquis (apixaban) daily prior to admission, now on ASA 325mg . Consider to resume eliquis in 10-14 days post stroke given large infarct with Hemorrhagic transformation.  Therapy recommendations: CIR Disposition:   pending  Seizures Seizure activity at approximately 3 am 7/7 Loaded with Keppra 2 grams IV EEG no seizure but cortical dysfunction at right hemisphere, moderate diffuse encephalopathy Continue Keppra 500 mg bid Monitor seizure activity  PAF On eliquis PTA Now eliquis on hold given large right MCA Infarct with HT On ASA 325 now Consider to resume elqiuis in 10-14 days  Respiratory difficulty Extubated but difficulty controlling secretions So far tolerating extubation well CCM on board for re-intubation if needed  Hypertension Home meds:  imdur 120mg  daily, lisinopril 20mg  daily, toprol XL 50mg  daily, Maxzide 37.5/25 Stable but on the high end Resume metoprolol and lisinopril SBP goal <160 given HT Hydralazine and labetalol prn Long-term BP goal normotensive  Hyperlipidemia Home meds:  Pravastatin 10mg  daily LDL 65, goal < 70  resumed pravastatin 10, no high intensity statin due to LDL at goal and advanced age Continue statin at discharge  Diabetes type II Uncontrolled Home meds:  levemir, metformin 500mg  bid, glimepiride 4mg  daily HgbA1c 9.6, goal < 7.0 CBGs SSI Now on lantus PCP follow up closely  Leukocytosis, improving WBC 12.6-14.6-12.1-12.6-10.4-10.6 UA negative CXR negative Close CBC monitoring  Dysphagia Did not pass swallow Cortrak placed; will need re-evaluation and possible PEG On tube feeding @ 50 and IV fluid @ 25 Cre 1.21-1.30-1.08-1.04-1.04  Agitation and delirium, improving Doing better overnight Off restrain On seroquel 25 Qhs Close monitoring  Other Stroke Risk Factors Advanced Age >/= 20  Remote ETOH use and drug use Obesity, Body mass index is 36.82 kg/m., BMI >/= 30 associated with increased stroke risk, recommend weight loss, diet and exercise as appropriate  Hx stroke: left MCA infact, May 2019  Other Active Problems Diabetic neuropathy  GERD: nexium 40mg  daily Hx of pulmonary embolus (11/01/2017) Splenic  infarction anxiety  Hospital day # [place], ARNP-C, ANVP-BC Pager: [phone]  Stroke Neurology 08/03/2020 3:43 PM  ATTENDING NOTE: I reviewed above note and agree with the assessment and plan. Pt was seen and examined.   No family at bedside.  Patient lying in bed, initially lethargic but with interaction, patient more perked up, and smiling, interactive, however still has global aphasia with left hemianopia and right gaze preference.  Moving all extremities.  Continue aspirin and pravastatin, pending CIR.  For detailed assessment and plan, please refer to above as I have made changes wherever appropriate.   Rosalin Hawking, MD PhD Stroke Neurology 08/03/2020 7:32 PM    To contact Stroke Continuity provider, please refer to [email or web address] After hours, contact General Neurology

## 2020-08-03 NOTE — Progress Notes (Signed)
  Speech Language Pathology Treatment: Dysphagia;Cognitive-Linquistic  Patient Details Name: Jennifer Lucero MRN: [id number] DOB: 13-Nov-1950 Today's Date: 08/03/2020 Time: 0630-1601 SLP Time Calculation (min) (ACUTE ONLY): 15 min  Assessment / Plan / Recommendation Clinical Impression  Jennifer Lucero up in chair during language and dysphagia therapy with husband present. Tongue is no longer protruding and she attempts to move on command. She was unable to adduct lips for speech production and teaspoon trials pudding did not elicit labial closure. Pudding remained on tongue and despite tactile pressure with spoon could not manipulate and was suctioned.   Therapist utilized familiar songs, rote speech segments to facilitate motor speech production without success. She did imitate varying pitch phonemes. Answered simple yes/no questions with 40% accuracy (environmental and biographical). Therapist will attempt picture board to assist with having needs met and continue receptive language and speech production.    HPI HPI: 70 yo admitted with left sided weakness, rightward gaze. CT right insular/peri-insular acute infarct, increased subarachnoid density at the right vertex, presumably diluted subarachnoid blood. Underwent thrombectomy with complete revascularization. PMH: GERD, esophagitis, stroke 2019 (no prior ST notes), A-fib, DM2, HTN, HLD      SLP Plan  Continue with current plan of care       Recommendations  Diet recommendations: NPO Medication Administration: Via alternative means                Oral Care Recommendations: Oral care QID Follow up Recommendations: Inpatient Rehab SLP Visit Diagnosis: Dysphagia, unspecified (R13.10);Aphasia (R47.01) Plan: Continue with current plan of care                       Jennifer Lucero 08/03/2020, 5:02 PM  Jennifer Lucero.Ed Risk analyst [phone] Office [phone]

## 2020-08-03 NOTE — Plan of Care (Signed)
  Problem: Clinical Measurements: Goal: Ability to maintain clinical measurements within normal limits will improve Outcome: Progressing   Problem: Clinical Measurements: Goal: Will remain free from infection Outcome: Progressing   Problem: Clinical Measurements: Goal: Diagnostic test results will improve Outcome: Progressing   Problem: Clinical Measurements: Goal: Respiratory complications will improve Outcome: Progressing   Problem: Clinical Measurements: Goal: Cardiovascular complication will be avoided Outcome: Progressing   Problem: Nutrition: Goal: Adequate nutrition will be maintained Outcome: Progressing   Problem: Elimination: Goal: Will not experience complications related to bowel motility Outcome: Progressing   Problem: Elimination: Goal: Will not experience complications related to urinary retention Outcome: Progressing   Problem: Nutrition: Goal: Risk of aspiration will decrease Outcome: Progressing   Problem: Nutrition: Goal: Dietary intake will improve Outcome: Progressing   Problem: Safety: Goal: Non-violent Restraint(s) Outcome: Not Progressing   Problem: Education: Goal: Knowledge of General Education information will improve Description: Including pain rating scale, medication(s)/side effects and non-pharmacologic comfort measures Outcome: Not Progressing   Problem: Health Behavior/Discharge Planning: Goal: Ability to manage health-related needs will improve Outcome: Not Progressing   Problem: Activity: Goal: Risk for activity intolerance will decrease Outcome: Not Progressing   Problem: Skin Integrity: Goal: Risk for impaired skin integrity will decrease Outcome: Not Progressing   Problem: Education: Goal: Knowledge of disease or condition will improve Outcome: Not Progressing   Problem: Education: Goal: Knowledge of secondary prevention will improve Outcome: Not Progressing   Problem: Coping: Goal: Will identify appropriate  support needs Outcome: Not Progressing   Problem: Health Behavior/Discharge Planning: Goal: Ability to manage health-related needs will improve Outcome: Not Progressing

## 2020-08-03 NOTE — Progress Notes (Signed)
PROGRESS NOTE    Jennifer Lucero  ZCH:[id number] DOB: March 02, 1950 DOA: 07/28/2020 PCP: Ocie Doyne., MD   Brief Narrative: Jennifer Lucero is a 70 y.o. female with a history of atrial fibrillation, diabetes mellitus, hypertension, hyperlipidemia. Patient presented secondary to left sided weakness and found to have right M2 occlusion leading to right MCA stroke. She was transferred to [facility] for IR mechanical thrombectomy with TICI 3 reperfusion. While admitted, she had an episode concerning for seizure activity and started on Keppra. Stroke has left patient with dysphagia/aphasia/left hemiplegia resulting in need for NG tube and possible need gor PEG tube placement. Plan for CIR discharge.   Assessment & Plan:   Active Problems:   Stroke (cerebrum) (HCC)   Middle cerebral artery embolism, right   Acute ischemic right MCA stroke (HCC)   Acute right MCA stroke with resultant aphasia, dysphagia, left hemiplegia Secondary to right M2 occlusion. S/p mechanical thrombectomy with TICI 3 reperfusion. Stroke is in setting of known atrial fibrillation on Eliquis. CT head was consistent with right MCA stroke and old left MCA infarct. CTA head/neck consistent with right M2 occlusion. Transthoracic Echocardiogram significant for no embolic source. LDL of 65. Hemoglobin A1C of 9.6%. PT/OT recommending inpatient rehabilitation. -Neurology recommendations -PT/OT recommendations: CIR -Tube feeds via Cortrak; may need PEG tube but this can be decided later if patient goes to CIR  Acute respiratory failure with hypoxia Occurred post-extubation from procedure. Patient required a short course of BiPAP. Currently weaned to 2 lpm of oxygen -Wean to room air  Seizures Episode of seizure activity noted on 7/7. EEG without evidence of seizure activity. Patient was started on Keppra 500 mg BID -Continue Keppra 500 mg BID  Paroxysmal atrial fibrillation Patient was previously on Eliquis which has been  discontinued secondary to large stroke with hemorrhagic transformation. Currently on Aspirin 325 mg with recommend to restart Eliquis in 10-14 days from 7/8.  Primary hypertension Patient is on Maxide and Imdur as an outpatient -Continue isosorbide dinitrate, metoprolol  Hyperlipidemia -Continue pravastatin  Diabetes mellitus, type 2 Diabetic neuropathy Hemoglobin A1C of 9.6. Patient is on metformin, Levemir and glimepiride as an outpatient. Started on Lantus and SSI in patient -Continue Lantus and SSI -Recommend to discontinue glimepiride on discharge  Leukocytosis Mildly elevated WBC which has trended down.  Delirium Started on Seroquel -Continue Seroquel qhs  History of pulmonary embolism Back from 2019. Was previously on Eliquis.   DVT prophylaxis: Heparin subq Code Status:   Code Status: Full Code Family Communication: None at bedside Disposition Plan: Per primary. Plan for discharge to CIR   Consultants:  PCCM General medicine  Procedures:  RT common carotid arteriogram followed by complete revascularization of occluded RT MCA (7/6)  Antimicrobials: None    Subjective: Patient not as responsive to questions today compared to yesterday. Alert and does try to motion at me. Per nursing, patient was awake all night and has since been more drowsy this morning.  Objective: Vitals:   08/03/20 0600 08/03/20 0700 08/03/20 0800 08/03/20 1200  BP: (!) 155/55 (!) 144/92    Pulse: 80 65    Resp: (!) 22 (!) 22    Temp:   98.1 F (36.7 C) 98.5 F (36.9 C)  TempSrc:   Axillary Axillary  SpO2: 94% 93%    Weight:        Intake/Output Summary (Last 24 hours) at 08/03/2020 1358 Last data filed at 08/03/2020 0700 Gross per 24 hour  Intake 1246.83 ml  Output  300 ml  Net 946.83 ml    Filed Weights   08/01/20 0400 08/02/20 0409 08/03/20 0500  Weight: 101.6 kg 102 kg 97.3 kg    Examination:  General exam: Appears calm and comfortable Respiratory system: Clear to  auscultation. Respiratory effort normal. Cardiovascular system: S1 & S2 heard, RRR. No murmurs, rubs, gallops or clicks. Gastrointestinal system: Abdomen is nondistended, soft and nontender. No organomegaly or masses felt. Normal bowel sounds heard. Central nervous system: Alert. Not following directions well today. No new focal deficits Musculoskeletal: No edema. No calf tenderness. Left below elbow amputation Skin: No cyanosis. No rashes    Data Reviewed: I have personally reviewed following labs and imaging studies  CBC Lab Results  Component Value Date   WBC 11.2 (H) 08/03/2020   RBC 3.50 (L) 08/03/2020   HGB 10.2 (L) 08/03/2020   HCT 32.0 (L) 08/03/2020   MCV 91.4 08/03/2020   MCH 29.1 08/03/2020   PLT 387 08/03/2020   MCHC 31.9 08/03/2020   RDW 15.2 08/03/2020   LYMPHSABS 1.9 07/29/2020   MONOABS 1.6 (H) 07/29/2020   EOSABS 0.0 07/29/2020   BASOSABS 0.0 32/35/5732     Last metabolic panel Lab Results  Component Value Date   NA 142 08/03/2020   K 4.3 08/03/2020   CL 104 08/03/2020   CO2 30 08/03/2020   BUN 27 (H) 08/03/2020   CREATININE 1.06 (H) 08/03/2020   GLUCOSE 130 (H) 08/03/2020   GFRNONAA 56 (L) 08/03/2020   GFRAA 58 (L) 10/24/2018   CALCIUM 9.7 08/03/2020   PHOS 4.4 08/03/2020   PROT 6.1 (L) 07/28/2020   ALBUMIN 3.1 (L) 07/28/2020   BILITOT 0.7 07/28/2020   ALKPHOS 38 07/28/2020   AST 41 07/28/2020   ALT 18 07/28/2020   ANIONGAP 8 08/03/2020    CBG (last 3)  Recent Labs    08/03/20 0345 08/03/20 0817 08/03/20 1134  GLUCAP 125* 171* 203*      GFR: Estimated Creatinine Clearance: 54.3 mL/min (A) (by C-G formula based on SCr of 1.06 mg/dL (H)).  Coagulation Profile: No results for input(s): INR, PROTIME in the last 168 hours.  Recent Results (from the past 240 hour(s))  Resp Panel by RT-PCR (Flu A&B, Covid) Nasal Mucosa     Status: None   Collection Time: 07/28/20  1:11 PM   Specimen: Nasal Mucosa; Nasopharyngeal(NP) swabs in vial  transport medium  Result Value Ref Range Status   SARS Coronavirus 2 by RT PCR NEGATIVE NEGATIVE Final    Comment: (NOTE) SARS-CoV-2 target nucleic acids are NOT DETECTED.  The SARS-CoV-2 RNA is generally detectable in upper respiratory specimens during the acute phase of infection. The lowest concentration of SARS-CoV-2 viral copies this assay can detect is 138 copies/mL. A negative result does not preclude SARS-Cov-2 infection and should not be used as the sole basis for treatment or other patient management decisions. A negative result may occur with  improper specimen collection/handling, submission of specimen other than nasopharyngeal swab, presence of viral mutation(s) within the areas targeted by this assay, and inadequate number of viral copies(<138 copies/mL). A negative result must be combined with clinical observations, patient history, and epidemiological information. The expected result is Negative.  Fact Sheet for Patients:  EntrepreneurPulse.com.au  Fact Sheet for Healthcare Providers:  IncredibleEmployment.be  This test is no t yet approved or cleared by the [place] FDA and  has been authorized for detection and/or diagnosis of SARS-CoV-2 by FDA under an Emergency Use Authorization (EUA). This EUA will  remain  in effect (meaning this test can be used) for the duration of the COVID-19 declaration under Section 564(b)(1) of the Act, 21 U.S.C.section 360bbb-3(b)(1), unless the authorization is terminated  or revoked sooner.       Influenza A by PCR NEGATIVE NEGATIVE Final   Influenza B by PCR NEGATIVE NEGATIVE Final    Comment: (NOTE) The Xpert Xpress SARS-CoV-2/FLU/RSV plus assay is intended as an aid in the diagnosis of influenza from Nasopharyngeal swab specimens and should not be used as a sole basis for treatment. Nasal washings and aspirates are unacceptable for Xpert Xpress SARS-CoV-2/FLU/RSV testing.  Fact  Sheet for Patients: EntrepreneurPulse.com.au  Fact Sheet for Healthcare Providers: IncredibleEmployment.be  This test is not yet approved or cleared by the [place] FDA and has been authorized for detection and/or diagnosis of SARS-CoV-2 by FDA under an Emergency Use Authorization (EUA). This EUA will remain in effect (meaning this test can be used) for the duration of the COVID-19 declaration under Section 564(b)(1) of the Act, 21 U.S.C. section 360bbb-3(b)(1), unless the authorization is terminated or revoked.  Performed at [facility], [place] [address] [place], [place] [address]   MRSA Next Gen by PCR, Nasal     Status: None   Collection Time: 07/28/20  5:08 PM   Specimen: Nasal Mucosa; Nasal Swab  Result Value Ref Range Status   MRSA by PCR Next Gen NOT DETECTED NOT DETECTED Final    Comment: (NOTE) The GeneXpert MRSA Assay (FDA approved for NASAL specimens only), is one component of a comprehensive MRSA colonization surveillance program. It is not intended to diagnose MRSA infection nor to guide or monitor treatment for MRSA infections. Test performance is not FDA approved in patients less than 35 years old. Performed at [facility], [place] [address], [place], [place] [address]          Radiology Studies: No results found.      Scheduled Meds:  aspirin  325 mg Per Tube Daily   chlorhexidine  15 mL Mouth Rinse BID   Chlorhexidine Gluconate Cloth  6 each Topical Q0600   feeding supplement (PROSource TF)  45 mL Per Tube Daily   heparin injection (subcutaneous)  5,000 Units Subcutaneous Q8H   insulin aspart  0-15 Units Subcutaneous Q4H   insulin aspart  2 Units Subcutaneous Q4H   insulin glargine  20 Units Subcutaneous Daily   isosorbide dinitrate  60 mg Per Tube BID   levETIRAcetam  500 mg Per Tube BID   lisinopril  20 mg Per Tube Daily   mouth rinse  15 mL Mouth Rinse q12n4p   metoprolol tartrate  25 mg Per  Tube BID   multivitamin with minerals  1 tablet Per Tube Daily   pneumococcal 23 valent vaccine  0.5 mL Intramuscular Tomorrow-1000   pravastatin  10 mg Per Tube Daily   QUEtiapine  25 mg Per Tube QHS   Continuous Infusions:  sodium chloride 25 mL/hr at 08/03/20 0700   feeding supplement (GLUCERNA 1.5 CAL) 50 mL/hr at 08/02/20 1900     LOS: 6 days     Cordelia Poche, MD [facility] 08/03/2020, 1:58 PM  If 7PM-7AM, please contact night-coverage [email or web address]

## 2020-08-03 NOTE — Progress Notes (Signed)
Inpatient Rehab Admissions Coordinator:   I opened a case with pt.'s insurance today and continue to follow for potential admit pending insurance auth and bed availability.  Clemens Catholic, [place], [place] Admissions Coordinator  [phone] ([place]) [phone] (office)

## 2020-08-04 DIAGNOSIS — I10 Essential (primary) hypertension: Secondary | ICD-10-CM | POA: Diagnosis not present

## 2020-08-04 DIAGNOSIS — D72829 Elevated white blood cell count, unspecified: Secondary | ICD-10-CM | POA: Diagnosis not present

## 2020-08-04 DIAGNOSIS — I6601 Occlusion and stenosis of right middle cerebral artery: Secondary | ICD-10-CM | POA: Diagnosis not present

## 2020-08-04 LAB — GLUCOSE, CAPILLARY
Glucose-Capillary: 131 mg/dL — ABNORMAL HIGH (ref 70–99)
Glucose-Capillary: 156 mg/dL — ABNORMAL HIGH (ref 70–99)
Glucose-Capillary: 179 mg/dL — ABNORMAL HIGH (ref 70–99)
Glucose-Capillary: 232 mg/dL — ABNORMAL HIGH (ref 70–99)
Glucose-Capillary: 150 mg/dL — ABNORMAL HIGH (ref 70–99)
Glucose-Capillary: 171 mg/dL — ABNORMAL HIGH (ref 70–99)
Glucose-Capillary: 252 mg/dL — ABNORMAL HIGH (ref 70–99)

## 2020-08-04 NOTE — Progress Notes (Signed)
Inpatient Rehab Admissions Coordinator:     I received insurance authorization for CIR this morning but have no bed for pt. Today. Will follow for potential admission pending bed availability.   Clemens Catholic, [place], [place] Admissions Coordinator  [phone] ([place]) [phone] (office)

## 2020-08-04 NOTE — Progress Notes (Addendum)
STROKE TEAM PROGRESS NOTE   INTERVAL HISTORY CNA and rehab team at the bedside. Now on neuro floor. She is active in rehab, but still with mixed aphasia  Vitals:   08/04/20 0737 08/04/20 1151 08/04/20 1200 08/04/20 1329  BP: (!) 162/60 (!) 194/69 (!) 188/69 (!) 161/58  Pulse: 82 72 66 72  Resp: 20 17 18 19   Temp: 99.1 F (37.3 C) 98 F (36.7 C)    TempSrc: Oral Oral    SpO2: 97% 99% 98% 99%  Weight:       CBC:  Recent Labs  Lab 07/29/20 0455 07/30/20 0512 08/02/20 0342 08/03/20 0256  WBC 14.6*   < > 10.6* 11.2*  NEUTROABS 10.9*  --   --   --   HGB 9.8*   < > 10.2* 10.2*  HCT 31.5*   < > 31.8* 32.0*  MCV 90.8   < > 89.6 91.4  PLT 332   < > 311 387   < > = values in this interval not displayed.    Basic Metabolic Panel:  Recent Labs  Lab 08/02/20 0342 08/03/20 0256  NA 141 142  K 4.4 4.3  CL 105 104  CO2 28 30  GLUCOSE 121* 130*  BUN 25* 27*  CREATININE 1.04* 1.06*  CALCIUM 9.3 9.7  MG 2.0 1.9  PHOS 5.1* 4.4    Lipid Panel:  Recent Labs  Lab 07/29/20 0455  CHOL 129  TRIG 84  HDL 47  CHOLHDL 2.7  VLDL 17  LDLCALC 65    HgbA1c:  Recent Labs  Lab 07/29/20 0455  HGBA1C 9.6*    Urine Drug Screen: No results for input(s): LABOPIA, COCAINSCRNUR, LABBENZ, AMPHETMU, THCU, LABBARB in the last 168 hours.  Alcohol Level No results for input(s): ETH in the last 168 hours.  IMAGING past 24 hours CT HEAD WO CONTRAST  Result Date: 07/29/2020 IMPRESSION:  1. Right insular and peri-insular acute infarct.  2. Increased subarachnoid density at the right vertex, presumably diluted subarachnoid blood.   CT HEAD WO CONTRAST  Result Date: 07/28/2020 IMPRESSION:  1. Expected evolution of right MCA territory infarct without hemorrhagic conversion or mass effect.  2. Old left MCA infarct.   PHYSICAL EXAM  Temp:  [97.8 F (36.6 C)-99.1 F (37.3 C)] 98 F (36.7 C) (07/13 1151) Pulse Rate:  [66-93] 72 (07/13 1329) Resp:  [17-27] 19 (07/13 1329) BP:  (116-194)/(58-97) 161/58 (07/13 1329) SpO2:  [93 %-100 %] 99 % (07/13 1329) Weight:  [96.5 kg] 96.5 kg (07/13 0500)  General - Well nourished, well developed, no acute distress or agitation Cardiovascular - irregularly irregular heart rate and rhythm, on tele sinus arrhythmia but not in afib.  Neuro - Alert, follows most commands, needs some prompting and at times she misses verbal que's. May have some receptive aphasia. Remains nonverbal. Not able to name or repeat. Eyes tracking bilaterally, no gaze palsy, blinking to visual threat on the right but not consistent on the left, PERRL. Left facial droop. Tongue protrusion not cooperative. RUEs 4/5, and LUE 3/5, left UE below elbow amputation. BLE proximal 3/5 and distal not cooperative on exam. Sensation and coordination not cooperative, gait not tested.    ASSESSMENT/PLAN [name] is a 70 y.o. female with history of A-fib on eliquis, DM2, HTN, HLD presenting with left sided weakness and rightward gaze preference.  CT head showed acute right MCA territory infarct.  CTA showed right M2 occlusion.  Now s/p thrombectomy with complete revascularization TICI 3.  Stroke - right MCA stroke secondary to right M2 occlusion s/p mechanical thrombectomy with TICI3 reperfusion in the setting of afib on Eliquis CT head acute right MCA stroke and old left MCA infarct.  CTA head & neck: right M2 occlusion S/p IR with TICI3 Repeat CT head: Right insular and peri-insular acute infarct. Increased subarachnoid density at the right vertex. MRI  large right MCA infarct with petechial HT. Old left frontal and cerebellar infarcts 2D Echo EF 55-60% LDL 65 HgbA1c 9.6 VTE prophylaxis - SCDs aspirin 81 mg daily and Eliquis (apixaban) daily prior to admission, now on ASA 325mg . Consider to resume eliquis in 10-14 days post stroke given large infarct with Hemorrhagic transformation.  Therapy recommendations: CIR Disposition:  insurance approved, she will d/c  tomorrow  Seizures Seizure activity at approximately 3 am 7/7 Loaded with Keppra 2 grams IV EEG no seizure but cortical dysfunction at right hemisphere, moderate diffuse encephalopathy Continue Keppra 500 mg bid Monitor seizure activity  PAF On eliquis PTA Now eliquis on hold given large right MCA Infarct with HT On ASA 325 now Resume elqiuis in 10-14 days from stroke onset  Respiratory difficulty Extubated but difficulty controlling secretions So far tolerating extubation well CCM on board for re-intubation if needed  Hypertension Home meds:  imdur 120mg  daily, lisinopril 20mg  daily, toprol XL 50mg  daily, Maxzide 37.5/25 Stable but on the high end Resume metoprolol and lisinopril SBP goal <160 given HT Hydralazine and labetalol prn Long-term BP goal normotensive  Hyperlipidemia Home meds:  Pravastatin 10mg  daily LDL 65, goal < 70  resumed pravastatin 10, no high intensity statin due to LDL at goal and advanced age Continue statin at discharge  Diabetes type II Uncontrolled Home meds:  levemir, metformin 500mg  bid, glimepiride 4mg  daily HgbA1c 9.6, goal < 7.0 CBGs SSI Now on lantus PCP follow up closely  Leukocytosis, improving WBC 12.6-14.6-12.1-12.6-10.4-10.6 UA negative CXR negative Close CBC monitoring  Dysphagia Did not pass swallow initially; anticipate repeat study while in CIR Cortrak placed; will need re-evaluation in CIR; may not need now that she is improving.  On tube feeding @ 50 and IV fluid @ 25 Cre 1.21-1.30-1.08-1.04-1.04  Agitation and delirium, improving Improved Off restrain On seroquel 25 Qhs, keep active in daytime with shade open; calm, dark room at night.  Close monitoring  Other Stroke Risk Factors Advanced Age >/= 76  Remote ETOH use and drug use Obesity, Body mass index is 36.52 kg/m., BMI >/= 30 associated with increased stroke risk, recommend weight loss, diet and exercise as appropriate  Hx stroke: left MCA infact, May  2019  Other Active Problems Diabetic neuropathy  GERD: nexium 40mg  daily Hx of pulmonary embolus (11/01/2017) Splenic infarction anxiety  Hospital day # [place], ARNP-C, ANVP-BC Pager: [phone]  Stroke Neurology 08/04/2020 2:20 PM  ATTENDING NOTE: I reviewed above note and agree with the assessment and plan. Pt was seen and examined.   No family at bedside.  Patient sitting in bed, still has drooling on the left side.  Able to follow simple commands with both feet and right hand.  But still nonverbal, able to write with right hand in the air for her age.  Exam still consistent with asarthria instead of aphasia.  Neuro stable, moving all extremities.  Pending CIR.  Continue current management.  For detailed assessment and plan, please refer to above as I have made changes wherever appropriate.   Rosalin Hawking, MD PhD Stroke Neurology 08/04/2020 9:00 PM  To contact Stroke Continuity provider, please refer to [email or web address] After hours, contact General Neurology

## 2020-08-04 NOTE — Progress Notes (Signed)
PROGRESS NOTE    Jennifer Lucero  NAT:[id number] DOB: 15-Aug-1950 DOA: 07/28/2020 PCP: Ocie Doyne., MD   Brief Narrative: Jennifer Lucero is a 70 y.o. female with a history of atrial fibrillation, diabetes mellitus, hypertension, hyperlipidemia. Patient presented secondary to left sided weakness and found to have right M2 occlusion leading to right MCA stroke. She was transferred to [facility] for IR mechanical thrombectomy with TICI 3 reperfusion. While admitted, she had an episode concerning for seizure activity and started on Keppra. Stroke has left patient with dysphagia/aphasia/left hemiplegia resulting in need for NG tube and possible need gor PEG tube placement. Plan for CIR discharge, although no beds available today.  Patient remains about the same, nonverbal.  Awake, follows commands but inconsistently.   Assessment & Plan:   Active Problems:   Stroke (cerebrum) (HCC)   Middle cerebral artery embolism, right   Acute ischemic right MCA stroke (HCC)   Acute right MCA stroke with resultant aphasia, dysphagia, left hemiplegia Secondary to right M2 occlusion. S/p mechanical thrombectomy with TICI 3 reperfusion. Stroke is in setting of known atrial fibrillation on Eliquis. CT head was consistent with right MCA stroke and old left MCA infarct. CTA head/neck consistent with right M2 occlusion. Transthoracic Echocardiogram significant for no embolic source. LDL of 65. Hemoglobin A1C of 9.6%. PT/OT recommending inpatient rehabilitation, bed pending.  Followed up by speech therapy continue to recommend NPO.  Acute respiratory failure with hypoxia Occurred post-extubation from procedure. Patient required a short course of BiPAP.  Weaned down to room air.  Seizures Episode of seizure activity noted on 7/7. EEG without evidence of seizure activity. Patient was started and continued on Keppra 500 mg BID  Paroxysmal atrial fibrillation Patient was previously on Eliquis which has been  discontinued secondary to large stroke with hemorrhagic transformation. Currently on Aspirin 325 mg with recommend to restart Eliquis in 10-14 days from 7/8.  Primary hypertension Patient is on Maxide and Imdur as an outpatient -Continue isosorbide dinitrate, metoprolol.  Allowing for some permissive hypertension  Hyperlipidemia -Continue pravastatin  Diabetes mellitus, type 2 Diabetic neuropathy Hemoglobin A1C of 9.6. Patient is on metformin, Levemir and glimepiride as an outpatient. Started on Lantus and SSI in patient.  Discontinue glimepiride on discharge. Following CBGs closely.  Leukocytosis Mildly elevated WBC which has trended down.  Recheck labs in the morning.  Delirium Started on Seroquel -Continue Seroquel qhs  History of pulmonary embolism Back from 2019. Was previously on Eliquis.   DVT prophylaxis: Heparin subq Code Status:   Code Status: Full Code Family Communication: None at bedside Disposition Plan: For discharge to inpatient rehab when bed available   Consultants:  PCCM Hospitalist service  Procedures:  RT common carotid arteriogram followed by complete revascularization of occluded RT MCA (7/6)  Antimicrobials: None     Objective: Vitals:   08/04/20 0500 08/04/20 0737 08/04/20 1151 08/04/20 1200  BP:  (!) 162/60 (!) 194/69 (!) 188/69  Pulse:  82 72 66  Resp:  20 17 18   Temp:  99.1 F (37.3 C) 98 F (36.7 C)   TempSrc:  Oral Oral   SpO2:  97% 99% 98%  Weight: 96.5 kg       Intake/Output Summary (Last 24 hours) at 08/04/2020 1359 Last data filed at 08/04/2020 0000 Gross per 24 hour  Intake 826.75 ml  Output 1200 ml  Net -373.25 ml    Filed Weights   08/02/20 0409 08/03/20 0500 08/04/20 0500  Weight: 102 kg 97.3 kg  96.5 kg    Examination:  General exam: Awake, no acute distress, does not follow all commands Respiratory system: Clear to auscultation.  Breathing not labored Cardiovascular system: Regular rate and rhythm,  S1-S2 Gastrointestinal system: Soft, nontender, nondistended, positive bowel sounds  Central nervous system: Alert. Not following directions well today. No new focal deficits Musculoskeletal: No edema. No calf tenderness.  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 No rashes    Data Reviewed: I have personally reviewed following labs and imaging studies  CBC Lab Results  Component Value Date   WBC 11.2 (H) 08/03/2020   RBC 3.50 (L) 08/03/2020   HGB 10.2 (L) 08/03/2020   HCT 32.0 (L) 08/03/2020   MCV 91.4 08/03/2020   MCH 29.1 08/03/2020   PLT 387 08/03/2020   MCHC 31.9 08/03/2020   RDW 15.2 08/03/2020   LYMPHSABS 1.9 07/29/2020   MONOABS 1.6 (H) 07/29/2020   EOSABS 0.0 07/29/2020   BASOSABS 0.0 32/35/5732     Last metabolic panel Lab Results  Component Value Date   NA 142 08/03/2020   K 4.3 08/03/2020   CL 104 08/03/2020   CO2 30 08/03/2020   BUN 27 (H) 08/03/2020   CREATININE 1.06 (H) 08/03/2020   GLUCOSE 130 (H) 08/03/2020   GFRNONAA 56 (L) 08/03/2020   GFRAA 58 (L) 10/24/2018   CALCIUM 9.7 08/03/2020   PHOS 4.4 08/03/2020   PROT 6.1 (L) 07/28/2020   ALBUMIN 3.1 (L) 07/28/2020   BILITOT 0.7 07/28/2020   ALKPHOS 38 07/28/2020   AST 41 07/28/2020   ALT 18 07/28/2020   ANIONGAP 8 08/03/2020    CBG (last 3)  Recent Labs    08/04/20 0357 08/04/20 0734 08/04/20 1153  GLUCAP 156* 179* 232*      GFR: Estimated Creatinine Clearance: 54.1 mL/min (A) (by C-G formula based on SCr of 1.06 mg/dL (H)).  Coagulation Profile: No results for input(s): INR, PROTIME in the last 168 hours.  Recent Results (from the past 240 hour(s))  Resp Panel by RT-PCR (Flu A&B, Covid) Nasal Mucosa     Status: None   Collection Time: 07/28/20  1:11 PM   Specimen: Nasal Mucosa; Nasopharyngeal(NP) swabs in vial transport medium  Result Value Ref Range Status   SARS Coronavirus 2 by RT PCR NEGATIVE NEGATIVE Final    Comment: (NOTE) SARS-CoV-2  target nucleic acids are NOT DETECTED.  The SARS-CoV-2 RNA is generally detectable in upper respiratory specimens during the acute phase of infection. The lowest concentration of SARS-CoV-2 viral copies this assay can detect is 138 copies/mL. A negative result does not preclude SARS-Cov-2 infection and should not be used as the sole basis for treatment or other patient management decisions. A negative result may occur with  improper specimen collection/handling, submission of specimen other than nasopharyngeal swab, presence of viral mutation(s) within the areas targeted by this assay, and inadequate number of viral copies(<138 copies/mL). A negative result must be combined with clinical observations, patient history, and epidemiological information. The expected result is Negative.  Fact Sheet for Patients:  EntrepreneurPulse.com.au  Fact Sheet for Healthcare Providers:  IncredibleEmployment.be  This test is no t yet approved or cleared by the [place] FDA and  has been authorized for detection and/or diagnosis of SARS-CoV-2 by FDA under an Emergency Use Authorization (EUA). This EUA will remain  in effect (meaning this test can be used) for the duration of the COVID-19 declaration under Section 564(b)(1) of the Act, 21 U.S.C.section 360bbb-3(b)(1), unless  the authorization is terminated  or revoked sooner.       Influenza A by PCR NEGATIVE NEGATIVE Final   Influenza B by PCR NEGATIVE NEGATIVE Final    Comment: (NOTE) The Xpert Xpress SARS-CoV-2/FLU/RSV plus assay is intended as an aid in the diagnosis of influenza from Nasopharyngeal swab specimens and should not be used as a sole basis for treatment. Nasal washings and aspirates are unacceptable for Xpert Xpress SARS-CoV-2/FLU/RSV testing.  Fact Sheet for Patients: EntrepreneurPulse.com.au  Fact Sheet for Healthcare  Providers: IncredibleEmployment.be  This test is not yet approved or cleared by the [place] FDA and has been authorized for detection and/or diagnosis of SARS-CoV-2 by FDA under an Emergency Use Authorization (EUA). This EUA will remain in effect (meaning this test can be used) for the duration of the COVID-19 declaration under Section 564(b)(1) of the Act, 21 U.S.C. section 360bbb-3(b)(1), unless the authorization is terminated or revoked.  Performed at [facility], [place] [address] [place], [place] [address]   MRSA Next Gen by PCR, Nasal     Status: None   Collection Time: 07/28/20  5:08 PM   Specimen: Nasal Mucosa; Nasal Swab  Result Value Ref Range Status   MRSA by PCR Next Gen NOT DETECTED NOT DETECTED Final    Comment: (NOTE) The GeneXpert MRSA Assay (FDA approved for NASAL specimens only), is one component of a comprehensive MRSA colonization surveillance program. It is not intended to diagnose MRSA infection nor to guide or monitor treatment for MRSA infections. Test performance is not FDA approved in patients less than 43 years old. Performed at [facility], [place] [address] [place], [place] [address]          Radiology Studies: No results found.      Scheduled Meds:  aspirin  325 mg Per Tube Daily   chlorhexidine  15 mL Mouth Rinse BID   Chlorhexidine Gluconate Cloth  6 each Topical Q0600   feeding supplement (PROSource TF)  45 mL Per Tube Daily   heparin injection (subcutaneous)  5,000 Units Subcutaneous Q8H   insulin aspart  0-15 Units Subcutaneous Q4H   insulin aspart  2 Units Subcutaneous Q4H   insulin glargine  20 Units Subcutaneous Daily   isosorbide dinitrate  60 mg Per Tube BID   levETIRAcetam  500 mg Per Tube BID   lisinopril  20 mg Per Tube Daily   mouth rinse  15 mL Mouth Rinse q12n4p   metoprolol tartrate  25 mg Per Tube BID   multivitamin with minerals  1 tablet Per Tube Daily   pneumococcal 23 valent  vaccine  0.5 mL Intramuscular Tomorrow-1000   pravastatin  10 mg Per Tube Daily   QUEtiapine  25 mg Per Tube QHS   Continuous Infusions:  sodium chloride 25 mL/hr at 08/04/20 1240   feeding supplement (GLUCERNA 1.5 CAL) 50 mL/hr at 08/04/20 0055     LOS: 7 days     Gevena Barre, MD [facility] 08/04/2020, 1:59 PM  If 7PM-7AM, please contact night-coverage [email or web address]

## 2020-08-04 NOTE — Progress Notes (Signed)
Nutrition Follow-up  DOCUMENTATION CODES:   Obesity unspecified  INTERVENTION:  Continue TF via Cortrak tube: Glucerna 1.5 at 50 ml/h (1200 ml per day) Prosource TF 45 ml daily  Provides 1840 kcal, 110 gm protein, 912 ml free water daily  MVI with minerals per tube; daily    NUTRITION DIAGNOSIS:   Inadequate oral intake related to inability to eat as evidenced by NPO status. -- ongoing  GOAL:   Patient will meet greater than or equal to 90% of their needs -- met with TF  MONITOR:   Diet advancement, TF tolerance  REASON FOR ASSESSMENT:   Consult Enteral/tube feeding initiation and management  ASSESSMENT:   Pt with PMH of Afib, uncontrolled DM, HTN, and HLD admitted with L sided weakness due to R MCA.   7/6 s/p IR thrombectomy with complete revascularization  7/8 cortrak placed; tip gastric    Pt remains nonverbal, inconsistently following commands. Tolerating TF via Cortrak per RN. If unable to pass swallow evaluation, recommend PEG placement. Pt is pending discharge to CIR at this time.   UOP: 1.9L x24 hours  Medications: Scheduled Meds:  aspirin  325 mg Per Tube Daily   chlorhexidine  15 mL Mouth Rinse BID   Chlorhexidine Gluconate Cloth  6 each Topical Q0600   feeding supplement (PROSource TF)  45 mL Per Tube Daily   heparin injection (subcutaneous)  5,000 Units Subcutaneous Q8H   insulin aspart  0-15 Units Subcutaneous Q4H   insulin aspart  2 Units Subcutaneous Q4H   insulin glargine  20 Units Subcutaneous Daily   isosorbide dinitrate  60 mg Per Tube BID   levETIRAcetam  500 mg Per Tube BID   lisinopril  20 mg Per Tube Daily   mouth rinse  15 mL Mouth Rinse q12n4p   metoprolol tartrate  25 mg Per Tube BID   multivitamin with minerals  1 tablet Per Tube Daily   pneumococcal 23 valent vaccine  0.5 mL Intramuscular Tomorrow-1000   pravastatin  10 mg Per Tube Daily   QUEtiapine  25 mg Per Tube QHS   Continuous Infusions:  sodium chloride 25 mL/hr at  08/04/20 1240   feeding supplement (GLUCERNA 1.5 CAL) 1,000 mL (08/04/20 1401)   Labs: Recent Labs  Lab 08/01/20 0126 08/02/20 0342 08/03/20 0256  NA 138 141 142  K 4.1 4.4 4.3  CL 102 105 104  CO2 27 28 30  BUN 22 25* 27*  CREATININE 1.04* 1.04* 1.06*  CALCIUM 9.0 9.3 9.7  MG 2.0 2.0 1.9  PHOS 4.2 5.1* 4.4  GLUCOSE 151* 121* 130*  CBGs 156-179-232  Diet Order:   Diet Order             Diet NPO time specified  Diet effective now                   EDUCATION NEEDS:   Not appropriate for education at this time  Skin:  Skin Assessment: Skin Integrity Issues: Skin Integrity Issues:: Incisions, Other (Comment) Incisions: incision R groin Other: MASD perineum  Last BM:  7/12 type 7  Height:   Ht Readings from Last 1 Encounters:  05/12/20 5' 4" (1.626 m)    Weight:   Wt Readings from Last 1 Encounters:  08/04/20 96.5 kg    BMI:  Body mass index is 36.52 kg/m.  Estimated Nutritional Needs:   Kcal:  1800-2000  Protein:  100-115 grams  Fluid:  >1.8 L/day     , MS, RD,   LDN (she/her/hers) RD pager number and weekend/on-call pager number located in [place].

## 2020-08-04 NOTE — Care Management Important Message (Signed)
Important Message  Patient Details  Name: Jennifer Lucero MRN: [id number] Date of Birth: 1950/08/17   Medicare Important Message Given:  Yes     Orbie Pyo 08/04/2020, 3:26 PM

## 2020-08-04 NOTE — PMR Pre-admission (Signed)
PMR Admission Coordinator Pre-Admission Assessment  Patient: Jennifer Lucero is an 70 y.o., female MRN: [id number] DOB: February 07, 1950 Height:   Weight: 96.7 kg  Insurance Information HMO:   yes   PPO:      PCP:      IPA:      80/20:      OTHER:  PRIMARY: [employer] Medicare       Policy#: [id number]      Subscriber: pt  CM Name: none       Phone#: [phone]     Fax#: [phone] I received a call from Jan granting approval 7/14-7/20 with updates due in 7 days from admission. Pre-Cert#: [id number]      Employer:  Benefits:  Phone: Online - [email or web address] Eff Date: 01/24/2020 - still active Deductible: $0 OOP Max: $7,550 ($0 met) CIR: $1480 admission copay SNF: $0.00 Copayment per day for days 1-20; $194.50 Copayment per day for days 21-100; Maximum of 100 days/benefit period Outpatient: 100% coverage Home Health:  100% coverage, 0% co-insurance; limited by medical necessity DME: 80% coverage; 20% co-insurance Providers: in network   SECONDARY:  Medicaid [place] Access  Eligible on 08/05/20 with coverage code [employer]   Policy#:  [id number]  Phone#:  [phone]  Financial Counselor:       Phone#:   The "Data Collection Information Summary" for patients in Inpatient Rehabilitation Facilities with attached "Privacy Act Wray Records" was provided and verbally reviewed with: Patient  Emergency Contact Information Contact Information     Name Relation Home Work [place] [phone]         Current Medical History  Patient Admitting Diagnosis: R CVA  History of Present Illness:  A29 year old right-handed female with history of atrial fibrillation reportedly on Eliquis, diabetes mellitus, hypertension hyperlipidemia.  Per chart review patient lives with spouse.  1 level home 4 steps to entry.  Reportedly independent prior to admission.  Presented 07/28/2020 to [facility] with acute onset of left-sided weakness and rightward gaze as well as  aphasia.  Cranial CT scan showed right insular and Peri insular acute infarct as well as old left MCA infarction.  Patient did not receive tPA.  CT angiogram demonstrated right M2 occlusion.  Patient was transferred to [facility] underwent mechanical thrombectomy per interventional radiology.  She did remain intubated for a short time.  MRI follow-up showed evolving acute to early subacute right MCA distribution infarct associated petechial hemorrhage.  On 07/29/2020 around 3 AM there was question seizure activity EEG completed showing no seizure moderate diffuse encephalopathy patient was loaded with Keppra.  Echocardiogram with ejection fraction of 55 to 32% grade 1 diastolic dysfunction.  Neurology follow-up currently maintained on aspirin 325 mg daily for CVA prophylaxis.  Her Eliquis remained on hold given large right MCA infarct with hemorrhagic transformation awaiting to resume in approximately 10 to 14 days.  She was cleared to begin subcutaneous heparin for DVT prophylaxis.  Patient currently n.p.o. with alternative means of nutritional support.  Bouts of agitation and restlessness maintained on Seroquel.  Therapy evaluations completed due to patient's left-sided weakness and decreased functional mobility was admitted for a comprehensive rehab program.  Complete NIHSS TOTAL: 17  Patient's medical record from [facility]  has been reviewed by the rehabilitation admission coordinator and physician.  Past Medical History  Past Medical History:  Diagnosis Date   Anemia    Angina pectoris ([place]) 10/24/2018   Anxiety 06/15/2017   Anxiety    Asthma 06/15/2017  Atrial fibrillation (HCC)    Chest pain 06/15/2017   Chronic anticoagulation 07/09/2014   DDD (degenerative disc disease), lumbar 01/13/2019   Last Assessment & Plan:  Formatting of this note might be different from the original. 70 year old female with chronic low back and right-sided L2-4 radicular symptoms.  Recently  updated MRI shows multilevel degeneration, level of greatest pathology at L3-4 where there is moderate broad-based disc osteophyte complex indents the thecal sac and combines with mild facet and ligamentum flavum hypertro   Diabetes (HCC)    Diabetic neuropathy (HCC) 06/15/2017   Esophagitis, reflux 06/15/2017   Essential hypertension 10/24/2018   GERD (gastroesophageal reflux disease)    History of pulmonary embolus (PE) 11/01/2017   Hyperlipidemia 06/15/2017   Insomnia 02/05/2019   Last Assessment & Plan:  Formatting of this note might be different from the original. Will trial trazodone 50-100 mg q.h.s..  Try 50 for 1 week and increase to 100 if no response, as tolerated.   Knee pain    Lumbago    Lumbar radiculopathy 01/13/2019   Last Assessment & Plan:  Formatting of this note might be different from the original. Discontinue gabapentin due to drowsiness and ataxia.  Continue with physical therapy as directed.   Osteoporosis 06/15/2017   Pulmonary embolism ([place]) 06/15/2017   Splenic infarction    Stroke [facility]) 06/15/2017   Vitamin D deficiency     Family History   family history includes Diabetes in her brother; Heart disease in her mother.  Prior Rehab/Hospitalizations Has the patient had prior rehab or hospitalizations prior to admission? No  Has the patient had major surgery during 100 days prior to admission? Yes   Current Medications  Current Facility-Administered Medications:    0.9 %  sodium chloride infusion, , Intravenous, Continuous, Rosalin Hawking, MD, Last Rate: 25 mL/hr at 08/04/20 1240, New Bag at 08/04/20 1240   acetaminophen (TYLENOL) tablet 650 mg, 650 mg, Oral, Q4H PRN **OR** acetaminophen (TYLENOL) 160 MG/5ML solution 650 mg, 650 mg, Per Tube, Q4H PRN, 650 mg at 08/01/20 1833 **OR** acetaminophen (TYLENOL) suppository 650 mg, 650 mg, Rectal, Q4H PRN, Greta Doom, MD   aspirin tablet 325 mg, 325 mg, Per Tube, Daily, Rosalin Hawking, MD, 325 mg at 08/05/20 0848    chlorhexidine (PERIDEX) 0.12 % solution 15 mL, 15 mL, Mouth Rinse, BID, Greta Doom, MD, 15 mL at 08/05/20 0847   Chlorhexidine Gluconate Cloth 2 % PADS 6 each, 6 each, Topical, Q0600, Luanne Bras, MD, 6 each at 08/05/20 0603   feeding supplement (GLUCERNA 1.5 CAL) liquid 1,000 mL, 1,000 mL, Per Tube, Continuous, Rosalin Hawking, MD, Last Rate: 50 mL/hr at 08/04/20 1401, 1,000 mL at 08/04/20 1401   feeding supplement (PROSource TF) liquid 45 mL, 45 mL, Per Tube, Daily, Rosalin Hawking, MD, 45 mL at 08/05/20 0848   heparin injection 5,000 Units, 5,000 Units, Subcutaneous, Q8H, Rosalin Hawking, MD, 5,000 Units at 08/05/20 0601   insulin aspart (novoLOG) injection 0-15 Units, 0-15 Units, Subcutaneous, Q4H, Greta Doom, MD, 3 Units at 08/05/20 0849   insulin aspart (novoLOG) injection 2 Units, 2 Units, Subcutaneous, Q4H, ClarkVenita Sheffield, DO, 2 Units at 08/05/20 0849   insulin glargine (LANTUS) injection 20 Units, 20 Units, Subcutaneous, Daily, Jacky Kindle, MD, 20 Units at 08/05/20 0849   isosorbide dinitrate (ISORDIL) tablet 60 mg, 60 mg, Per Tube, BID, Noemi Chapel P, DO, 60 mg at 08/05/20 0850   labetalol (NORMODYNE) injection 10-20 mg, 10-20 mg, Intravenous, Q6H PRN, Chand, Sudham,  MD, 20 mg at 08/04/20 1239   levETIRAcetam (KEPPRA) 100 MG/ML solution 500 mg, 500 mg, Per Tube, BID, Rosalin Hawking, MD, 500 mg at 08/05/20 0848   lisinopril (ZESTRIL) tablet 20 mg, 20 mg, Per Tube, Daily, Rosalin Hawking, MD, 20 mg at 08/05/20 0848   MEDLINE mouth rinse, 15 mL, Mouth Rinse, q12n4p, Greta Doom, MD, 15 mL at 08/04/20 1603   metoprolol tartrate (LOPRESSOR) 25 mg/10 mL oral suspension 25 mg, 25 mg, Per Tube, BID, Donnetta Simpers, MD, 25 mg at 08/05/20 0850   multivitamin with minerals tablet 1 tablet, 1 tablet, Per Tube, Daily, Garvin Fila, MD, 1 tablet at 08/05/20 0848   pneumococcal 23 valent vaccine (PNEUMOVAX-23) injection 0.5 mL, 0.5 mL, Intramuscular, Tomorrow-1000,  Kirkpatrick, Vida Roller, MD   pravastatin (PRAVACHOL) tablet 10 mg, 10 mg, Per Tube, Daily, Rosalin Hawking, MD, 10 mg at 08/05/20 0849   QUEtiapine (SEROQUEL) tablet 25 mg, 25 mg, Per Tube, QHS, Rosalin Hawking, MD, 25 mg at 08/04/20 2141  Patients Current Diet:  Diet Order             Diet NPO time specified  Diet effective now                   Precautions / Restrictions Precautions Precautions: Fall Precaution Comments: Cortrak, posey, mitten, L below elbow amputation hx, L inattention Restrictions Weight Bearing Restrictions: No   Has the patient had 2 or more falls or a fall with injury in the past year? Yes  Prior Activity Level Community (5-7x/wk): Pt. was active in the community PTA  Prior Functional Level Self Care: Did the patient need help bathing, dressing, using the toilet or eating? Independent  Indoor Mobility: Did the patient need assistance with walking from room to room (with or without device)? Independent  Stairs: Did the patient need assistance with internal or external stairs (with or without device)? Independent  Functional Cognition: Did the patient need help planning regular tasks such as shopping or remembering to take medications? Independent  Home Assistive Devices / Equipment Home Assistive Devices/Equipment: Cane (specify quad or straight), CBG Meter, Blood pressure cuff, Contact lenses Home Equipment: Cane - quad, Cane - single point, Shower seat  Prior Device Use: Indicate devices/aids used by the patient prior to current illness, exacerbation or injury? None of the above  Current Functional Level Cognition  Arousal/Alertness: Awake/alert Overall Cognitive Status: Difficult to assess Difficult to assess due to: Impaired communication Current Attention Level: Sustained Orientation Level: Oriented to person Following Commands: Follows one step commands inconsistently Safety/Judgement: Decreased awareness of safety, Decreased awareness of  deficits General Comments: Patient non verbal. Follows up to 75% of simple 1 step commands throughout sesion and improved accuracy with tactile cues Attention: Sustained Sustained Attention: Impaired Sustained Attention Impairment: Functional basic Memory:  (will assess as able) Awareness: Impaired Awareness Impairment: Emergent impairment Problem Solving: Impaired Problem Solving Impairment: Functional basic Safety/Judgment: Impaired    Extremity Assessment (includes Sensation/Coordination)  Upper Extremity Assessment: LUE deficits/detail LUE Deficits / Details: residual limb (has prothesis but husband reports she does not usually use it), tightness, neglect, moving today, but stiffness with elbow ext +30* LUE Coordination: decreased gross motor  Lower Extremity Assessment: Defer to PT evaluation LLE Deficits / Details: Decreased strength noted with mobility; slow initiation, coordination deficits LLE Coordination: decreased gross motor    ADLs  Overall ADL's : Needs assistance/impaired Eating/Feeding: NPO Eating/Feeding Details (indicate cue type and reason): total A Grooming: Moderate assistance, Sitting, Minimal  assistance, Standing Grooming Details (indicate cue type and reason): combing hair mostly R side; washing face mostly R side; cues required to attend to L side of face. stnading at sink for light grooming, mildly perseverative for washing R hand Upper Body Bathing: Total assistance, Sitting Lower Body Bathing: Total assistance Lower Body Bathing Details (indicate cue type and reason): Min A +2 sit<>stand Upper Body Dressing : Total assistance, Sitting Lower Body Dressing: Total assistance Lower Body Dressing Details (indicate cue type and reason): Min A +2 sit<>stand Toilet Transfer: Minimal assistance, +2 for physical assistance, Ambulation Toilet Transfer Details (indicate cue type and reason): Bil HHA Toileting- Clothing Manipulation and Hygiene: Moderate assistance,  Sit to/from stand Toileting - Clothing Manipulation Details (indicate cue type and reason): Pt using RUE to attempt in standing at commode Functional mobility during ADLs: Minimal assistance, +2 for physical assistance, Cueing for safety, Cueing for sequencing General ADL Comments: Pt showing improvements today and requires multimodal cues to complete task, following commands 75% of time and using L side more than upon OT eval. Pt's vision appears to be improving with direct eye contact and use of compensatory strategies to look to L with cues 50% of the time.    Mobility  Overal bed mobility: Needs Assistance Bed Mobility: Supine to Sit, Sit to Supine, Rolling Rolling: Min assist Supine to sit: Min assist, +2 for physical assistance Sit to supine: Min assist General bed mobility comments: Patient with bowel movement on arrival requiring rolling L/R with minA. TotalA for pericare. minA+2 for OOB for trunk elevation and repositioning hips towards EOB. MinA to return to supine with assist for L LE    Transfers  Overall transfer level: Needs assistance Equipment used: 2 person hand held assist Transfers: Sit to/from Stand Sit to Stand: Min assist, +2 safety/equipment General transfer comment: minA to rise from low bed surface and steady    Ambulation / Gait / Stairs / Wheelchair Mobility  Ambulation/Gait Ambulation/Gait assistance: Mod assist, +2 safety/equipment Gait Distance (Feet): 20 Feet Assistive device: 2 person hand held assist Gait Pattern/deviations: Step-through pattern, Decreased stride length General Gait Details: modA for balance and consistent cueing for direction. L inattention at times with close navigation to objects in room on L . Gait velocity: reduced Gait velocity interpretation: <1.8 ft/sec, indicate of risk for recurrent falls    Posture / Balance Balance Overall balance assessment: Needs assistance Sitting-balance support: No upper extremity supported, Feet  supported Sitting balance-Leahy Scale: Fair Standing balance support: Bilateral upper extremity supported, During functional activity Standing balance-Leahy Scale: Poor Standing balance comment: requires UE support and external assist for ambulation    Special needs/care consideration Has been using restraints to keep tubes from being pulled out.  Has a coretrak.  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   Previous Home Environment (from acute therapy documentation) Living Arrangements: Spouse/significant other  Lives With: Spouse Available Help at Discharge: Family, Available 24 hours/day Type of Home: Mobile home Home Layout: One level Home Access: Stairs to enter Entrance Stairs-Rails: Right, Left, Can reach both Entrance Stairs-Number of Steps: 4 Bathroom Shower/Tub: Tub/shower unit, Multimedia programmer: Handicapped height [place]: No  Discharge Living Setting Plans for Discharge Living Setting: Patient's home Type of Home at Discharge: Mobile home Discharge Home Layout: One level Discharge Home Access: Stairs to enter Entrance Stairs-Rails: Right, Left, Can reach both Entrance Stairs-Number of Steps: 4 Discharge Bathroom Shower/Tub: Tub/shower unit Discharge Bathroom Toilet: Handicapped height Discharge Bathroom Accessibility: Yes Does the patient  have any problems obtaining your medications?: No  Social/Family/Support Systems Patient Roles: Spouse Contact Information: [phone] Anticipated Caregiver: Dorothia Passmore Anticipated Caregiver's Contact Information: [phone] Ability/Limitations of Caregiver: Can provide min-mod A Caregiver Availability: 24/7 Discharge Plan Discussed with Primary Caregiver: Yes Is Caregiver In Agreement with Plan?: Yes  Goals Patient/Family Goal for Rehab: PT/OT/SLP Supervision Pt/Family Agrees to Admission and willing to participate: Yes Program Orientation Provided & Reviewed with Pt/Caregiver Including Roles  &  Responsibilities: Yes  Decrease burden of Care through IP rehab admission: Specialzed equipment needs, Decrease number of caregivers, Bowel and bladder program, and Patient/family education  Possible need for SNF placement upon discharge: not stated   Patient Condition: I have reviewed medical records from [facility], spoken with CM, and patient and spouse. I met with patient at the bedside for inpatient rehabilitation assessment.  Patient will benefit from ongoing PT, OT, and SLP, can actively participate in 3 hours of therapy a day 5 days of the week, and can make measurable gains during the admission.  Patient will also benefit from the coordinated team approach during an Inpatient Acute Rehabilitation admission.  The patient will receive intensive therapy as well as Rehabilitation physician, nursing, social worker, and care management interventions.  Due to bladder management, bowel management, safety, skin/wound care, disease management, medication administration, pain management, and patient education the patient requires 24 hour a day rehabilitation nursing.  The patient is currently Min to mod assist with mobility and basic ADLs.  Discharge setting and therapy post discharge at home with home health is anticipated.  Patient has agreed to participate in the Acute Inpatient Rehabilitation Program and will admit today.  Preadmission Screen Completed By:  Retta Diones, 08/05/2020 12:02 PM ______________________________________________________________________   Discussed status with Dr. Dagoberto Ligas on 08/05/20 at 0930 and received approval for admission today.  Admission Coordinator:  Retta Diones, RN, time 1210/Date 08/05/20   Assessment/Plan: Diagnosis: Does the need for close, 24 hr/day Medical supervision in concert with the patient's rehab needs make it unreasonable for this patient to be served in a less intensive setting? Yes Co-Morbidities requiring supervision/potential  complications: encephalopathy, L weakness; aphasia; R brain stroke s/p thrombectomy; seizures?; NPO/dysphagia- Cortrak; agitation Due to bladder management, bowel management, safety, skin/wound care, disease management, medication administration, pain management, and patient education, does the patient require 24 hr/day rehab nursing? Yes Does the patient require coordinated care of a physician, rehab nurse, PT, OT, and SLP to address physical and functional deficits in the context of the above medical diagnosis(es)? Yes Addressing deficits in the following areas: balance, endurance, locomotion, strength, transferring, bowel/bladder control, bathing, dressing, feeding, grooming, toileting, cognition, speech, language, and swallowing Can the patient actively participate in an intensive therapy program of at least 3 hrs of therapy 5 days a week? Yes The potential for patient to make measurable gains while on inpatient rehab is fair Anticipated functional outcomes upon discharge from inpatient rehab: supervision and min assist PT, supervision and min assist OT, min assist SLP Estimated rehab length of stay to reach the above functional goals is: 20-24 days  Anticipated discharge destination: Home 10. Overall Rehab/Functional Prognosis: fair   MD Signature:

## 2020-08-04 NOTE — Progress Notes (Signed)
Physical Therapy Treatment Patient Details Name: Jennifer Lucero MRN: [id number] DOB: 04-28-1950 Today's Date: 08/04/2020    History of Present Illness Jennifer Lucero is a 70 y.o. female who presented 7/6 with left-sided weakness that started abruptly and sudden onset of rightward gaze.CT/CT angiogram demonstrated right M2 occlusion and previous left-sided stroke. MRI: Evolving acute to early subacute right MCA distribution infarct.  Pt s/p RT common carotid arteriogram followed by complete revascularization of occluded RT MCA. PHMx: atrial fibrillation, diabetes, hypertension, hyperlipidemia, left below elbow amputation.    PT Comments    Patient following simple 1 step commands up to 75% of session. Attempted verbalizations this session with utterance of "Rod Holler" noted. Patient shaking head yes/no to questions 50% appropriately. Patient with bowel movement on arrival requiring minA to roll and totalA to perform pericare. Patient ambulated 59' with modA+2 and HHAx2, cues for sequencing at beginning and L inattention noted with walking closely to objects on L with no awareness until drawing her attention to L side. Continue to recommend comprehensive inpatient rehab (CIR) for post-acute therapy needs.     Follow Up Recommendations  CIR;Supervision/Assistance - 24 hour     Equipment Recommendations  3in1 (PT);Wheelchair (measurements PT);Wheelchair cushion (measurements PT)    Recommendations for Other Services       Precautions / Restrictions Precautions Precautions: Fall Precaution Comments: Cortrak, posey, mitten, L below elbow amputation hx, L inattention Restrictions Weight Bearing Restrictions: No    Mobility  Bed Mobility Overal bed mobility: Needs Assistance Bed Mobility: Supine to Sit;Sit to Supine;Rolling Rolling: Min assist   Supine to sit: Min assist;+2 for physical assistance Sit to supine: Min assist   General bed mobility comments: Patient with bowel movement on  arrival requiring rolling L/R with minA. TotalA for pericare. minA+2 for OOB for trunk elevation and repositioning hips towards EOB. MinA to return to supine with assist for L LE    Transfers Overall transfer level: Needs assistance Equipment used: 2 person hand held assist Transfers: Sit to/from Stand Sit to Stand: Min assist;+2 safety/equipment         General transfer comment: minA to rise from low bed surface and steady  Ambulation/Gait Ambulation/Gait assistance: Mod assist;+2 safety/equipment Gait Distance (Feet): 20 Feet Assistive device: 2 person hand held assist Gait Pattern/deviations: Step-through pattern;Decreased stride length Gait velocity: reduced   General Gait Details: modA for balance and consistent cueing for direction. L inattention at times with close navigation to objects in room on L .   Stairs             Wheelchair Mobility    Modified Rankin (Stroke Patients Only) Modified Rankin (Stroke Patients Only) Pre-Morbid Rankin Score: No symptoms Modified Rankin: Moderately severe disability     Balance Overall balance assessment: Needs assistance Sitting-balance support: No upper extremity supported;Feet supported Sitting balance-Leahy Scale: Fair     Standing balance support: Bilateral upper extremity supported;During functional activity Standing balance-Leahy Scale: Poor Standing balance comment: requires UE support and external assist for ambulation                            Cognition Arousal/Alertness: Awake/alert Behavior During Therapy: Flat affect Overall Cognitive Status: Difficult to assess Area of Impairment: Safety/judgement;Attention;Following commands;Awareness;Memory;Problem solving                   Current Attention Level: Sustained Memory: Decreased short-term memory Following Commands: Follows one step commands inconsistently Safety/Judgement: Decreased awareness of  safety;Decreased awareness of  deficits Awareness: Intellectual Problem Solving: Slow processing;Decreased initiation;Requires verbal cues General Comments: Patient non verbal. Follows up to 75% of simple 1 step commands throughout sesion and improved accuracy with tactile cues      Exercises      General Comments        Pertinent Vitals/Pain Pain Assessment: Faces Faces Pain Scale: No hurt Pain Intervention(s): Monitored during session    Home Living                      Prior Function            PT Goals (current goals can now be found in the care plan section) Acute Rehab PT Goals Patient Stated Goal: unable to state (non-verbal) PT Goal Formulation: With patient/family Time For Goal Achievement: 08/13/20 Potential to Achieve Goals: Good Progress towards PT goals: Progressing toward goals    Frequency    Min 4X/week      PT Plan Current plan remains appropriate    Co-evaluation              AM-PAC PT "6 Clicks" Mobility   Outcome Measure  Help needed turning from your back to your side while in a flat bed without using bedrails?: A Little Help needed moving from lying on your back to sitting on the side of a flat bed without using bedrails?: A Little Help needed moving to and from a bed to a chair (including a wheelchair)?: Total Help needed standing up from a chair using your arms (e.g., wheelchair or bedside chair)?: Total Help needed to walk in hospital room?: Total Help needed climbing 3-5 steps with a railing? : Total 6 Click Score: 10    End of Session Equipment Utilized During Treatment: Gait belt Activity Tolerance: Patient tolerated treatment well Patient left: in bed;with call bell/phone within reach;with bed alarm set Nurse Communication: Mobility status PT Visit Diagnosis: Unsteadiness on feet (R26.81);Other abnormalities of gait and mobility (R26.89);Muscle weakness (generalized) (M62.81);Difficulty in walking, not elsewhere classified (R26.2);Other  symptoms and signs involving the nervous system (R29.898);Hemiplegia and hemiparesis Hemiplegia - Right/Left: Left Hemiplegia - dominant/non-dominant: Non-dominant Hemiplegia - caused by: Cerebral infarction     Time: 1325-1401 PT Time Calculation (min) (ACUTE ONLY): 36 min  Charges:  $Gait Training: 8-22 mins $Therapeutic Activity: 8-22 mins                     Rochelle Larue A. Gilford Rile PT, DPT Acute Rehabilitation Services Pager [phone] Office [phone]    Linna Hoff 08/04/2020, 5:26 PM

## 2020-08-05 ENCOUNTER — Inpatient Hospital Stay (HOSPITAL_COMMUNITY)
Admission: RE | Admit: 2020-08-05 | Discharge: 2020-08-20 | DRG: 057 | Disposition: A | Discharge status: Home Health | Payer: Medicare Other | Source: Intra-hospital | Attending: Physical Medicine & Rehabilitation | Admitting: Physical Medicine & Rehabilitation

## 2020-08-05 DIAGNOSIS — Z794 Long term (current) use of insulin: Secondary | ICD-10-CM | POA: Diagnosis not present

## 2020-08-05 DIAGNOSIS — R32 Unspecified urinary incontinence: Secondary | ICD-10-CM | POA: Diagnosis present

## 2020-08-05 DIAGNOSIS — G8194 Hemiplegia, unspecified affecting left nondominant side: Secondary | ICD-10-CM

## 2020-08-05 DIAGNOSIS — I69319 Unspecified symptoms and signs involving cognitive functions following cerebral infarction: Secondary | ICD-10-CM | POA: Diagnosis not present

## 2020-08-05 DIAGNOSIS — I69354 Hemiplegia and hemiparesis following cerebral infarction affecting left non-dominant side: Principal | ICD-10-CM

## 2020-08-05 DIAGNOSIS — R4701 Aphasia: Secondary | ICD-10-CM | POA: Diagnosis present

## 2020-08-05 DIAGNOSIS — I69391 Dysphagia following cerebral infarction: Secondary | ICD-10-CM

## 2020-08-05 DIAGNOSIS — M5116 Intervertebral disc disorders with radiculopathy, lumbar region: Secondary | ICD-10-CM | POA: Diagnosis present

## 2020-08-05 DIAGNOSIS — Z8249 Family history of ischemic heart disease and other diseases of the circulatory system: Secondary | ICD-10-CM

## 2020-08-05 DIAGNOSIS — Z79899 Other long term (current) drug therapy: Secondary | ICD-10-CM | POA: Diagnosis not present

## 2020-08-05 DIAGNOSIS — F419 Anxiety disorder, unspecified: Secondary | ICD-10-CM | POA: Diagnosis present

## 2020-08-05 DIAGNOSIS — N3941 Urge incontinence: Secondary | ICD-10-CM | POA: Diagnosis present

## 2020-08-05 DIAGNOSIS — I4891 Unspecified atrial fibrillation: Secondary | ICD-10-CM | POA: Diagnosis present

## 2020-08-05 DIAGNOSIS — E785 Hyperlipidemia, unspecified: Secondary | ICD-10-CM | POA: Diagnosis present

## 2020-08-05 DIAGNOSIS — Z7901 Long term (current) use of anticoagulants: Secondary | ICD-10-CM | POA: Diagnosis not present

## 2020-08-05 DIAGNOSIS — I69322 Dysarthria following cerebral infarction: Secondary | ICD-10-CM | POA: Diagnosis not present

## 2020-08-05 DIAGNOSIS — Z885 Allergy status to narcotic agent status: Secondary | ICD-10-CM | POA: Diagnosis not present

## 2020-08-05 DIAGNOSIS — Z89122 Acquired absence of left wrist: Secondary | ICD-10-CM

## 2020-08-05 DIAGNOSIS — I69320 Aphasia following cerebral infarction: Secondary | ICD-10-CM | POA: Diagnosis not present

## 2020-08-05 DIAGNOSIS — E1140 Type 2 diabetes mellitus with diabetic neuropathy, unspecified: Secondary | ICD-10-CM | POA: Diagnosis present

## 2020-08-05 DIAGNOSIS — Z86711 Personal history of pulmonary embolism: Secondary | ICD-10-CM

## 2020-08-05 DIAGNOSIS — Z833 Family history of diabetes mellitus: Secondary | ICD-10-CM

## 2020-08-05 DIAGNOSIS — Z7984 Long term (current) use of oral hypoglycemic drugs: Secondary | ICD-10-CM

## 2020-08-05 DIAGNOSIS — J45909 Unspecified asthma, uncomplicated: Secondary | ICD-10-CM | POA: Diagnosis present

## 2020-08-05 DIAGNOSIS — R195 Other fecal abnormalities: Secondary | ICD-10-CM | POA: Diagnosis not present

## 2020-08-05 DIAGNOSIS — M810 Age-related osteoporosis without current pathological fracture: Secondary | ICD-10-CM | POA: Diagnosis present

## 2020-08-05 DIAGNOSIS — I6601 Occlusion and stenosis of right middle cerebral artery: Secondary | ICD-10-CM | POA: Diagnosis not present

## 2020-08-05 DIAGNOSIS — Z781 Physical restraint status: Secondary | ICD-10-CM

## 2020-08-05 DIAGNOSIS — I63511 Cerebral infarction due to unspecified occlusion or stenosis of right middle cerebral artery: Secondary | ICD-10-CM

## 2020-08-05 DIAGNOSIS — Z4659 Encounter for fitting and adjustment of other gastrointestinal appliance and device: Secondary | ICD-10-CM

## 2020-08-05 DIAGNOSIS — Z888 Allergy status to other drugs, medicaments and biological substances status: Secondary | ICD-10-CM

## 2020-08-05 DIAGNOSIS — S41111A Laceration without foreign body of right upper arm, initial encounter: Secondary | ICD-10-CM | POA: Diagnosis not present

## 2020-08-05 DIAGNOSIS — E559 Vitamin D deficiency, unspecified: Secondary | ICD-10-CM | POA: Diagnosis present

## 2020-08-05 DIAGNOSIS — Z881 Allergy status to other antibiotic agents status: Secondary | ICD-10-CM

## 2020-08-05 DIAGNOSIS — R569 Unspecified convulsions: Secondary | ICD-10-CM | POA: Diagnosis present

## 2020-08-05 DIAGNOSIS — I10 Essential (primary) hypertension: Secondary | ICD-10-CM | POA: Diagnosis present

## 2020-08-05 DIAGNOSIS — R1310 Dysphagia, unspecified: Secondary | ICD-10-CM

## 2020-08-05 DIAGNOSIS — R1312 Dysphagia, oropharyngeal phase: Secondary | ICD-10-CM | POA: Diagnosis not present

## 2020-08-05 HISTORY — DX: Hemiplegia, unspecified affecting left nondominant side: G81.94

## 2020-08-05 HISTORY — DX: Dysphagia, unspecified: R13.10

## 2020-08-05 HISTORY — DX: Cerebral infarction due to unspecified occlusion or stenosis of right middle cerebral artery: I63.511

## 2020-08-05 HISTORY — DX: Aphasia: R47.01

## 2020-08-05 LAB — GLUCOSE, CAPILLARY
Glucose-Capillary: 131 mg/dL — ABNORMAL HIGH (ref 70–99)
Glucose-Capillary: 177 mg/dL — ABNORMAL HIGH (ref 70–99)
Glucose-Capillary: 238 mg/dL — ABNORMAL HIGH (ref 70–99)
Glucose-Capillary: 146 mg/dL — ABNORMAL HIGH (ref 70–99)
Glucose-Capillary: 208 mg/dL — ABNORMAL HIGH (ref 70–99)

## 2020-08-05 LAB — CBC
HCT: 33 % — ABNORMAL LOW (ref 36.0–46.0)
Hemoglobin: 10.1 g/dL — ABNORMAL LOW (ref 12.0–15.0)
MCH: 28.4 pg (ref 26.0–34.0)
MCHC: 30.6 g/dL (ref 30.0–36.0)
MCV: 92.7 fL (ref 80.0–100.0)
Platelets: 448 10*3/uL — ABNORMAL HIGH (ref 150–400)
RBC: 3.56 MIL/uL — ABNORMAL LOW (ref 3.87–5.11)
RDW: 15.2 % (ref 11.5–15.5)
WBC: 10.4 10*3/uL (ref 4.0–10.5)
nRBC: 0.2 % (ref 0.0–0.2)

## 2020-08-05 LAB — CREATININE, SERUM
Creatinine, Ser: 1.28 mg/dL — ABNORMAL HIGH (ref 0.44–1.00)
GFR, Estimated: 45 mL/min — ABNORMAL LOW (ref >60)

## 2020-08-05 MED ORDER — FLUCONAZOLE 100 MG PO TABS: 100.0000 mg | ORAL_TABLET | Freq: Every day | ORAL | Status: DC

## 2020-08-05 MED ORDER — ACETAMINOPHEN 325 MG PO TABS
650.0000 mg | ORAL_TABLET | ORAL | Status: DC | PRN
  Administered 2020-08-09 – 2020-08-18 (×7): 650 mg via ORAL
  Filled 2020-08-05 (×8): qty 2

## 2020-08-05 MED ORDER — INSULIN ASPART 100 UNIT/ML IJ SOLN
2.0000 [IU] | INTRAMUSCULAR | Status: DC
  Administered 2020-08-05 – 2020-08-09 (×18): 2 [IU] via SUBCUTANEOUS

## 2020-08-05 MED ORDER — ACETAMINOPHEN 325 MG PO TABS: 650.0000 mg | ORAL_TABLET | ORAL | Status: DC | PRN

## 2020-08-05 MED ORDER — BLOOD PRESSURE CONTROL BOOK
Freq: Once | Status: AC
  Filled 2020-08-05: qty 1

## 2020-08-05 MED ORDER — NYSTATIN 100000 UNIT/GM EX POWD
1.0000 "application " | Freq: Two times a day (BID) | CUTANEOUS | Status: DC
  Administered 2020-08-06 – 2020-08-20 (×28): 1 via TOPICAL
  Filled 2020-08-05: qty 15

## 2020-08-05 MED ORDER — INSULIN ASPART 100 UNIT/ML IJ SOLN
0.0000 [IU] | INTRAMUSCULAR | Status: DC
  Administered 2020-08-05: 2 [IU] via SUBCUTANEOUS
  Administered 2020-08-06: 5 [IU] via SUBCUTANEOUS
  Administered 2020-08-06 – 2020-08-07 (×2): 3 [IU] via SUBCUTANEOUS
  Administered 2020-08-07 (×2): 5 [IU] via SUBCUTANEOUS
  Administered 2020-08-07 – 2020-08-08 (×7): 3 [IU] via SUBCUTANEOUS
  Administered 2020-08-08: 5 [IU] via SUBCUTANEOUS
  Administered 2020-08-08 – 2020-08-09 (×2): 3 [IU] via SUBCUTANEOUS
  Administered 2020-08-09: 0 [IU] via SUBCUTANEOUS
  Administered 2020-08-09 (×2): 3 [IU] via SUBCUTANEOUS
  Administered 2020-08-09: 8 [IU] via SUBCUTANEOUS
  Administered 2020-08-09 – 2020-08-10 (×2): 3 [IU] via SUBCUTANEOUS
  Administered 2020-08-10: 2 [IU] via SUBCUTANEOUS
  Administered 2020-08-10: 5 [IU] via SUBCUTANEOUS
  Administered 2020-08-10: 3 [IU] via SUBCUTANEOUS
  Administered 2020-08-10: 2 [IU] via SUBCUTANEOUS
  Administered 2020-08-10: 3 [IU] via SUBCUTANEOUS
  Administered 2020-08-11: 5 [IU] via SUBCUTANEOUS
  Administered 2020-08-11 (×2): 3 [IU] via SUBCUTANEOUS
  Administered 2020-08-11 (×3): 2 [IU] via SUBCUTANEOUS
  Administered 2020-08-12: 5 [IU] via SUBCUTANEOUS
  Administered 2020-08-12: 3 [IU] via SUBCUTANEOUS
  Administered 2020-08-12: 5 [IU] via SUBCUTANEOUS
  Administered 2020-08-12 (×3): 3 [IU] via SUBCUTANEOUS
  Administered 2020-08-13: 2 [IU] via SUBCUTANEOUS
  Administered 2020-08-13: 5 [IU] via SUBCUTANEOUS
  Administered 2020-08-13: 3 [IU] via SUBCUTANEOUS
  Administered 2020-08-14: 8 [IU] via SUBCUTANEOUS
  Administered 2020-08-14: 5 [IU] via SUBCUTANEOUS
  Administered 2020-08-14: 2 [IU] via SUBCUTANEOUS
  Administered 2020-08-14: 5 [IU] via SUBCUTANEOUS
  Administered 2020-08-14 – 2020-08-15 (×2): 8 [IU] via SUBCUTANEOUS
  Administered 2020-08-15: 11 [IU] via SUBCUTANEOUS
  Administered 2020-08-15: 8 [IU] via SUBCUTANEOUS
  Administered 2020-08-15: 3 [IU] via SUBCUTANEOUS
  Administered 2020-08-15: 11 [IU] via SUBCUTANEOUS
  Administered 2020-08-15: 5 [IU] via SUBCUTANEOUS
  Administered 2020-08-16 (×2): 3 [IU] via SUBCUTANEOUS
  Administered 2020-08-16 (×2): 15 [IU] via SUBCUTANEOUS
  Administered 2020-08-16 – 2020-08-17 (×3): 3 [IU] via SUBCUTANEOUS
  Administered 2020-08-17 (×2): 2 [IU] via SUBCUTANEOUS
  Administered 2020-08-17: 3 [IU] via SUBCUTANEOUS
  Administered 2020-08-17 (×2): 5 [IU] via SUBCUTANEOUS
  Administered 2020-08-18: 15 [IU] via SUBCUTANEOUS
  Administered 2020-08-18 (×2): 2 [IU] via SUBCUTANEOUS
  Administered 2020-08-18: 15 [IU] via SUBCUTANEOUS
  Administered 2020-08-18 – 2020-08-19 (×4): 8 [IU] via SUBCUTANEOUS
  Administered 2020-08-19 (×2): 5 [IU] via SUBCUTANEOUS
  Administered 2020-08-19: 15 [IU] via SUBCUTANEOUS
  Administered 2020-08-20: 5 [IU] via SUBCUTANEOUS
  Administered 2020-08-20: 2 [IU] via SUBCUTANEOUS
  Administered 2020-08-20: 5 [IU] via SUBCUTANEOUS

## 2020-08-05 MED ORDER — ISOSORBIDE DINITRATE 30 MG PO TABS
60.0000 mg | ORAL_TABLET | Freq: Two times a day (BID) | ORAL | 0 refills | Status: DC
Start: 2020-08-05 — End: 2020-08-20

## 2020-08-05 MED ORDER — ACETAMINOPHEN 650 MG RE SUPP
650.0000 mg | RECTAL | Status: DC | PRN
  Administered 2020-08-06: 650 mg via RECTAL
  Filled 2020-08-05: qty 1

## 2020-08-05 MED ORDER — QUETIAPINE FUMARATE 25 MG PO TABS: 25.0000 mg | ORAL_TABLET | Freq: Every day | ORAL | 0 refills | Status: DC

## 2020-08-05 MED ORDER — HEPARIN SODIUM (PORCINE) 5000 UNIT/ML IJ SOLN: 5000.0000 [IU] | Freq: Three times a day (TID) | INTRAMUSCULAR | 0 refills | Status: DC

## 2020-08-05 MED ORDER — EXERCISE FOR HEART AND HEALTH BOOK
Freq: Once | Status: AC
  Filled 2020-08-05: qty 1

## 2020-08-05 MED ORDER — ACETAMINOPHEN 160 MG/5ML PO SOLN
650.0000 mg | ORAL | Status: DC | PRN
  Administered 2020-08-13 – 2020-08-16 (×7): 650 mg
  Filled 2020-08-05 (×8): qty 20.3

## 2020-08-05 MED ORDER — INSULIN GLARGINE 100 UNIT/ML ~~LOC~~ SOLN
20.0000 [IU] | Freq: Every day | SUBCUTANEOUS | Status: DC
  Administered 2020-08-07 – 2020-08-08 (×2): 20 [IU] via SUBCUTANEOUS
  Filled 2020-08-05 (×3): qty 0.2

## 2020-08-05 MED ORDER — LISINOPRIL 20 MG PO TABS
20.0000 mg | ORAL_TABLET | Freq: Every day | ORAL | Status: DC
  Administered 2020-08-06 – 2020-08-20 (×14): 20 mg
  Filled 2020-08-05 (×14): qty 1

## 2020-08-05 MED ORDER — ADULT MULTIVITAMIN W/MINERALS CH: 1.0000 | ORAL_TABLET | Freq: Every day | ORAL | Status: DC

## 2020-08-05 MED ORDER — METOPROLOL TARTRATE 25 MG/10 ML ORAL SUSPENSION
25.0000 mg | Freq: Two times a day (BID) | ORAL | Status: DC
  Administered 2020-08-06 – 2020-08-20 (×26): 25 mg
  Filled 2020-08-05 (×30): qty 10

## 2020-08-05 MED ORDER — METOPROLOL TARTRATE 25 MG/10 ML ORAL SUSPENSION: 25.0000 mg | Freq: Two times a day (BID) | ORAL | 0 refills | Status: DC

## 2020-08-05 MED ORDER — QUETIAPINE FUMARATE 25 MG PO TABS
25.0000 mg | ORAL_TABLET | Freq: Every day | ORAL | Status: DC
  Administered 2020-08-06 – 2020-08-16 (×11): 25 mg
  Filled 2020-08-05 (×12): qty 1

## 2020-08-05 MED ORDER — PRAVASTATIN SODIUM 10 MG PO TABS
10.0000 mg | ORAL_TABLET | Freq: Every day | ORAL | Status: DC
  Administered 2020-08-06 – 2020-08-20 (×14): 10 mg
  Filled 2020-08-05 (×14): qty 1

## 2020-08-05 MED ORDER — GLUCERNA 1.5 CAL PO LIQD: 1000.0000 mL | ORAL | 0 refills | Status: DC

## 2020-08-05 MED ORDER — ADULT MULTIVITAMIN W/MINERALS CH
1.0000 | ORAL_TABLET | Freq: Every day | ORAL | Status: DC
  Administered 2020-08-07 – 2020-08-20 (×13): 1
  Filled 2020-08-05 (×13): qty 1

## 2020-08-05 MED ORDER — HEPARIN SODIUM (PORCINE) 5000 UNIT/ML IJ SOLN: 5000.0000 [IU] | Freq: Three times a day (TID) | INTRAMUSCULAR | Status: DC

## 2020-08-05 MED ORDER — FLUCONAZOLE 100MG IVPB
100.0000 mg | INTRAVENOUS | Status: DC
  Administered 2020-08-06: 100 mg via INTRAVENOUS
  Filled 2020-08-05 (×2): qty 50

## 2020-08-05 MED ORDER — LIVING WELL WITH DIABETES BOOK
Freq: Once | Status: AC
  Filled 2020-08-05: qty 1

## 2020-08-05 MED ORDER — ASPIRIN 325 MG PO TABS
325.0000 mg | ORAL_TABLET | Freq: Every day | ORAL | Status: DC
  Administered 2020-08-06 – 2020-08-12 (×7): 325 mg
  Filled 2020-08-05 (×7): qty 1

## 2020-08-05 MED ORDER — ASPIRIN 325 MG PO TABS: 325.0000 mg | ORAL_TABLET | Freq: Every day | ORAL | Status: DC

## 2020-08-05 MED ORDER — HEPARIN SODIUM (PORCINE) 5000 UNIT/ML IJ SOLN
5000.0000 [IU] | Freq: Three times a day (TID) | INTRAMUSCULAR | Status: DC
  Administered 2020-08-06 – 2020-08-17 (×33): 5000 [IU] via SUBCUTANEOUS
  Filled 2020-08-05 (×34): qty 1

## 2020-08-05 MED ORDER — LEVETIRACETAM IN NACL 500 MG/100ML IV SOLN
500.0000 mg | Freq: Two times a day (BID) | INTRAVENOUS | Status: DC
  Administered 2020-08-06 – 2020-08-16 (×22): 500 mg via INTRAVENOUS
  Filled 2020-08-05 (×27): qty 100

## 2020-08-05 MED ORDER — LEVETIRACETAM 100 MG/ML PO SOLN
500.0000 mg | Freq: Two times a day (BID) | ORAL | Status: DC
  Filled 2020-08-05: qty 5

## 2020-08-05 MED ORDER — PROSOURCE TF PO LIQD
45.0000 mL | Freq: Every day | ORAL | Status: DC
  Administered 2020-08-07 – 2020-08-12 (×6): 45 mL
  Filled 2020-08-05 (×6): qty 45

## 2020-08-05 MED ORDER — LEVETIRACETAM 100 MG/ML PO SOLN: 500.0000 mg | Freq: Two times a day (BID) | ORAL | 12 refills | Status: DC

## 2020-08-05 MED ORDER — GLUCERNA 1.5 CAL PO LIQD
1000.0000 mL | ORAL | Status: DC
  Administered 2020-08-05: 1000 mL
  Filled 2020-08-05 (×2): qty 1000

## 2020-08-05 MED ORDER — PRAVASTATIN SODIUM 10 MG PO TABS: 10.0000 mg | ORAL_TABLET | Freq: Every day | ORAL | 0 refills | Status: DC

## 2020-08-05 MED ORDER — ISOSORBIDE DINITRATE 30 MG PO TABS
60.0000 mg | ORAL_TABLET | Freq: Two times a day (BID) | ORAL | Status: DC
  Administered 2020-08-06 – 2020-08-20 (×27): 60 mg
  Filled 2020-08-05 (×30): qty 2

## 2020-08-05 NOTE — Progress Notes (Signed)
Patient has an area of raw, excoriated skin on both sides of the perineum. Each area is approximately 10 cm x 4.5 cm. Patient incontinent of stool and these areas bled when cleaned gently. Patient expressed pain at contact. Barrier cream applied and PA notified.

## 2020-08-05 NOTE — Progress Notes (Signed)
IP rehab admissions - I met with husband and patient at the bedside.  They are in agreement to CIR today.  We do have approval from insurance carrier.  Bed available and will admit to CIR today.  Call for questions.  [phone]

## 2020-08-05 NOTE — Progress Notes (Signed)
Charges reviewed and placed.    08/05/2020  Rich, OTR/L  Acute Rehabilitation Services  Office:  [phone]

## 2020-08-05 NOTE — Progress Notes (Signed)
  Speech Language Pathology Treatment: Dysphagia;Cognitive-Linquistic  Patient Details Name: Jennifer Lucero MRN: [id number] DOB: 04-06-1950 Today's Date: 08/05/2020 Time: 8295-6213 SLP Time Calculation (min) (ACUTE ONLY): 22 min  Assessment / Plan / Recommendation Clinical Impression  SLP followed up for continued dysphagia and cognitive linguistic intervention. Pt remained alert throughout session. She was able to follow simple commands with multimodal cues with ~40 percent accuracy. She did utilize some nonverbal communication with head shake or nod to to simple questions, though persists with mixed expressive receptive aphasia.   Provided diligent oral care. Pt with some lingual coating, reducing with use of toothbrush and scrubbing. Pt noted to have some reduced management of saliva with thicker saliva pooling near posterior oral cavity, suctioned and removed. Baseline respiration noted to have somewhat wetness upon exhalation of breath sounds, suspect integrity of laryngeal sensation is reduced or poor following CVA and wetness appreciated. Trialed single ice chip via tsp and 1/4 tsp puree in anterior oral cavity. Pt continues to exhibit severe oral apraxia with disorganized and minimal bolus propulsion. Followed deep oral suction, pt with palpable reflexive swallows. Spontaneous exhalation continued to ellicit some wetness with suspected reduced airway protection. Given pts significant oral and suspected pharyngeal dysphagia involvement, pt unfortunately not exhibiting readiness for instrumental assessment yet. Likelihood for long term means of alternative nutrition remains high per family goals of care. SLP will continue efforts for dysphagia and cognitive linguistic rehabilitation.    HPI HPI: 70 yo admitted with left sided weakness, rightward gaze. CT right insular/peri-insular acute infarct, increased subarachnoid density at the right vertex, presumably diluted subarachnoid blood. Underwent  thrombectomy with complete revascularization. PMH: GERD, esophagitis, stroke 2019 (no prior ST notes; though L MCA region reported), A-fib, DM2, HTN, HLD      SLP Plan  Continue with current plan of care       Recommendations  Diet recommendations: NPO Medication Administration: Via alternative means                Oral Care Recommendations: Oral care QID Follow up Recommendations: Inpatient Rehab SLP Visit Diagnosis: Dysphagia, unspecified (R13.10);Aphasia (R47.01) Plan: Continue with current plan of care       GO                Hayden Rasmussen MA, CCC-SLP Acute Rehabilitation Services   08/05/2020, 9:35 AM

## 2020-08-05 NOTE — Progress Notes (Signed)
Occupational Therapy Treatment Patient Details Name: Jennifer Lucero MRN: [id number] DOB: 04-Sep-1950 Today's Date: 08/05/2020    History of present illness 70 y.o. female who presented 7/6 with left-sided weakness that started abruptly and sudden onset of rightward gaze.CT/CT angiogram demonstrated right M2 occlusion and previous left-sided stroke. MRI: Evolving acute to early subacute right MCA distribution infarct.  Pt s/p RT common carotid arteriogram followed by complete revascularization of occluded RT MCA. PHMx: atrial fibrillation, diabetes, hypertension, hyperlipidemia, left below elbow amputation.   OT comments  Pt progressing towards established OT goals. Pt performing IADL focusing on BUE coordination and L attention. Pt performing reaching within and outside of BOS. Pt requiring moderate verbal and visual cues throughout session to attend to L side during laundry, cleaning tasks, and reaching tasks. Performing functional mobility with Min A +2 for safety. Due to support and significant change in functional status, continue to recommend discharge to CIR and will continue to follow acutely to optimize safety and independence in ADLs.     Follow Up Recommendations  CIR;Supervision/Assistance - 24 hour    Equipment Recommendations  3 in 1 bedside commode    Recommendations for Other Services      Precautions / Restrictions Precautions Precautions: Fall Precaution Comments: Cortrak, posey, L below elbow amputation hx, L inattention Restrictions Weight Bearing Restrictions: No       Mobility Bed Mobility Overal bed mobility: Needs Assistance Bed Mobility: Supine to Sit;Sit to Supine     Supine to sit: Min assist Sit to supine: Min guard   General bed mobility comments: minA for initiation and handheld assist to achieve upright sitting at EOB. Min guard to return to bed with no physical assistance required. Increased time and verbal cues to follow commands.     Transfers Overall transfer level: Needs assistance Equipment used: 2 person hand held assist Transfers: Sit to/from Stand Sit to Stand: Min assist;+2 safety/equipment         General transfer comment: minA to rise from low bed surface and steady    Balance Overall balance assessment: Needs assistance Sitting-balance support: No upper extremity supported;Feet supported Sitting balance-Leahy Scale: Fair Sitting balance - Comments: Performing MMT sitting EOB and reaching outside of BOS to touch therapist hand.   Standing balance support: During functional activity;No upper extremity supported Standing balance-Leahy Scale: Fair Standing balance comment: min guard for safety during standing reaching tasks                           ADL either performed or assessed with clinical judgement   ADL Overall ADL's : Needs assistance/impaired                         Toilet Transfer: Minimal assistance;+2 for physical assistance;Ambulation Toilet Transfer Details (indicate cue type and reason): Bil HHA         Functional mobility during ADLs: Minimal assistance;+2 for physical assistance;Cueing for safety;Cueing for sequencing General ADL Comments: Pt performing IADL laundry task. Folding wash cloths and towels. Pt requiring mod verbal cues to fold into a square. Pt requiring cues to scan L to bring corners of towel together. Cleaning table with LUE, cues required for visual scanning all the way to the edge of table. Using BUE to open no rinse cleaning spray.     Vision   Vision Assessment?: Vision impaired- to be further tested in functional context Additional Comments: L inattention. Pt with inconsistencies  regarding attention to L side of environment, but will spontaneously scan to L.   Perception     Praxis      Cognition Arousal/Alertness: Awake/alert Behavior During Therapy: Flat affect Overall Cognitive Status: Difficult to assess Area of Impairment:  Safety/judgement;Following commands;Attention;Awareness;Problem solving;Memory                   Current Attention Level: Sustained Memory: Decreased short-term memory Following Commands: Follows one step commands with increased time;Follows one step commands inconsistently Safety/Judgement: Decreased awareness of safety;Decreased awareness of deficits Awareness: Intellectual Problem Solving: Slow processing;Requires verbal cues;Requires tactile cues;Difficulty sequencing General Comments: Following 75% of simple 1 step commands. Pt slightly impulsive. Performing IADL laundry task and requiring cues to slow down and use BUE. Pt nonverbal but responding to yes/no questions with nodding yes/shaking head no. Decreased attention to L requiring cues for attention.        Exercises Exercises: Other exercises Other Exercises Other Exercises: reaching within and outside of BOS with no UE support and Min guard A. Crossing midline with BUE to reach across body. Tactile and verbal cues to scan and then reach to L side of body. Pt with undershooting to reach therapists hand. Other Exercises: Performing IADL. Cleaning rolling tray. Using BUE to open cleaner and requiring cues to spray and wipe L side of tray. Spraying tray with RUE and wiping tray with LUE. Other Exercises: folding wash cloths and towels. Cues to slowing pace and focus on precision. Cues for scanning L and lining up corners of towel on L side.   Shoulder Instructions       General Comments Pt husband present. Pt pleasant throughout session.    Pertinent Vitals/ Pain       Pain Assessment: Faces Faces Pain Scale: No hurt Pain Intervention(s): Monitored during session  Home Living                                          Prior Functioning/Environment              Frequency  Min 2X/week        Progress Toward Goals  OT Goals(current goals can now be found in the care plan section)     Acute  Rehab OT Goals Patient Stated Goal: unable to state (non-verbal) OT Goal Formulation: Patient unable to participate in goal setting Time For Goal Achievement: 08/13/20 Potential to Achieve Goals: Good ADL Goals Pt Will Perform Grooming: with min assist;sitting Pt Will Perform Upper Body Bathing: with min assist;sitting Pt Will Perform Lower Body Bathing: with min assist Pt Will Transfer to Toilet: with supervision;ambulating;regular height toilet;grab bars Pt Will Perform Toileting - Clothing Manipulation and hygiene: with min assist Additional ADL Goal #1: Pt will follow 75% of one step commands without added gestures Additional ADL Goal #2: Pt will attend to left 25% of session Additional ADL Goal #3: Pt will bed S in and OOB for basic ADLs  Plan Discharge plan remains appropriate    Co-evaluation    PT/OT/SLP Co-Evaluation/Treatment: Yes Reason for Co-Treatment: Complexity of the patient's impairments (multi-system involvement);Necessary to address cognition/behavior during functional activity;For patient/therapist safety;To address functional/ADL transfers PT goals addressed during session: Mobility/safety with mobility OT goals addressed during session: ADL's and self-care      AM-PAC OT "6 Clicks" Daily Activity     Outcome Measure   Help from another person  eating meals?: Total Help from another person taking care of personal grooming?: A Lot Help from another person toileting, which includes using toliet, bedpan, or urinal?: A Lot Help from another person bathing (including washing, rinsing, drying)?: A Lot Help from another person to put on and taking off regular upper body clothing?: A Little Help from another person to put on and taking off regular lower body clothing?: A Lot 6 Click Score: 12    End of Session Equipment Utilized During Treatment: Gait belt  OT Visit Diagnosis: Unsteadiness on feet (R26.81);Other abnormalities of gait and mobility (R26.89);Muscle  weakness (generalized) (M62.81);Other symptoms and signs involving cognitive function;Cognitive communication deficit (R41.841) Symptoms and signs involving cognitive functions: Nontraumatic intracerebral hemorrhage   Activity Tolerance Patient tolerated treatment well   Patient Left in bed;with call bell/phone within reach;with bed alarm set;with family/visitor present   Nurse Communication Mobility status        Time: 0347-4259 OT Time Calculation (min): 30 min  Charges: OT General Charges $OT Visit: 1 Visit OT Treatments $Self Care/Home Management : 8-22 mins  Shanda Howells, OTDS    Shanda Howells 08/05/2020, 3:27 PM

## 2020-08-05 NOTE — Progress Notes (Signed)
Physical Therapy Treatment Patient Details Name: Jennifer Lucero MRN: [id number] DOB: [birth year] Today's Date: 08/05/2020    History of Present Illness Jennifer Lucero is a 70 y.o. female who presented 7/6 with left-sided weakness that started abruptly and sudden onset of rightward gaze.CT/CT angiogram demonstrated right M2 occlusion and previous left-sided stroke. MRI: Evolving acute to early subacute right MCA distribution infarct.  Pt s/p RT common carotid arteriogram followed by complete revascularization of occluded RT MCA. PHMx: atrial fibrillation, diabetes, hypertension, hyperlipidemia, left below elbow amputation.    PT Comments    Patient progressing towards physical therapy goals. Patient performed reaching tasks within and outside BOS and across midline. Patient ambulated 72' with minA+2 and HHAx1 progressing to no AD. Requires verbal and tactile cueing at times due to decreased initiation. Patient able to follow 75% of simple one step commands. Continue to recommend comprehensive inpatient rehab (CIR) for post-acute therapy needs.     Follow Up Recommendations  CIR;Supervision/Assistance - 24 hour     Equipment Recommendations  3in1 (PT);Wheelchair (measurements PT);Wheelchair cushion (measurements PT)    Recommendations for Other Services       Precautions / Restrictions Precautions Precautions: Fall Precaution Comments: Cortrak, posey, L below elbow amputation hx, L inattention Restrictions Weight Bearing Restrictions: No    Mobility  Bed Mobility Overal bed mobility: Needs Assistance Bed Mobility: Supine to Sit;Sit to Supine     Supine to sit: Min assist Sit to supine: Min guard   General bed mobility comments: minA for initiation and handheld assist to achieve upright sitting at EOB. Min guard to return to bed with no physical assistance required    Transfers Overall transfer level: Needs assistance Equipment used: 2 person hand held assist Transfers: Sit  to/from Stand Sit to Stand: Min assist;+2 safety/equipment         General transfer comment: minA to rise from low bed surface and steady  Ambulation/Gait Ambulation/Gait assistance: Min assist;+2 safety/equipment Gait Distance (Feet): 60 Feet Assistive device: 2 person hand held assist;None Gait Pattern/deviations: Step-through pattern;Decreased stride length Gait velocity: reduced   General Gait Details: minA+2 for balance and directional cueing. Patient tuning towards L during ambulation. Able to ambulate 40 with no AD and required minA+2 for balance.   Stairs             Wheelchair Mobility    Modified Rankin (Stroke Patients Only) Modified Rankin (Stroke Patients Only) Pre-Morbid Rankin Score: No symptoms Modified Rankin: Moderately severe disability     Balance Overall balance assessment: Needs assistance Sitting-balance support: No upper extremity supported;Feet supported Sitting balance-Leahy Scale: Fair     Standing balance support: During functional activity;No upper extremity supported Standing balance-Leahy Scale: Fair Standing balance comment: min guard for safety during standing reaching tasks                            Cognition Arousal/Alertness: Awake/alert Behavior During Therapy: Flat affect Overall Cognitive Status: Difficult to assess Area of Impairment: Safety/judgement;Attention;Following commands;Awareness;Memory;Problem solving                   Current Attention Level: Sustained Memory: Decreased short-term memory Following Commands: Follows one step commands with increased time;Follows one step commands inconsistently Safety/Judgement: Decreased awareness of safety;Decreased awareness of deficits Awareness: Intellectual Problem Solving: Slow processing;Requires verbal cues;Requires tactile cues General Comments: Requires encouragement for OOB mobility. Following 75% of simple 1 step commands. L inattention noted  during activities and ambulation.  Non verbal but able to shake yes/no to orientation questions with 75% accuracy.      Exercises Other Exercises Other Exercises: Reaching within and outside BOS with no UE support and min guard. Requires tactile and verbal cueing to attend to L and use L UE Other Exercises: Sit to stand x 4 throughout session    General Comments        Pertinent Vitals/Pain Pain Assessment: Faces Faces Pain Scale: No hurt Pain Intervention(s): Monitored during session    Home Living                      Prior Function            PT Goals (current goals can now be found in the care plan section) Acute Rehab PT Goals Patient Stated Goal: unable to state (non-verbal) PT Goal Formulation: With patient/family Time For Goal Achievement: 08/13/20 Potential to Achieve Goals: Good Progress towards PT goals: Progressing toward goals    Frequency    Min 4X/week      PT Plan Current plan remains appropriate    Co-evaluation PT/OT/SLP Co-Evaluation/Treatment: Yes Reason for Co-Treatment: For patient/therapist safety;To address functional/ADL transfers;Necessary to address cognition/behavior during functional activity PT goals addressed during session: Balance;Mobility/safety with mobility;Strengthening/ROM        AM-PAC PT "6 Clicks" Mobility   Outcome Measure  Help needed turning from your back to your side while in a flat bed without using bedrails?: A Little Help needed moving from lying on your back to sitting on the side of a flat bed without using bedrails?: A Little Help needed moving to and from a bed to a chair (including a wheelchair)?: Total Help needed standing up from a chair using your arms (e.g., wheelchair or bedside chair)?: Total Help needed to walk in hospital room?: Total Help needed climbing 3-5 steps with a railing? : Total 6 Click Score: 10    End of Session Equipment Utilized During Treatment: Gait belt Activity  Tolerance: Patient tolerated treatment well Patient left: in bed;with call bell/phone within reach;with bed alarm set Nurse Communication: Mobility status PT Visit Diagnosis: Unsteadiness on feet (R26.81);Other abnormalities of gait and mobility (R26.89);Muscle weakness (generalized) (M62.81);Difficulty in walking, not elsewhere classified (R26.2);Other symptoms and signs involving the nervous system (R29.898);Hemiplegia and hemiparesis Hemiplegia - Right/Left: Left Hemiplegia - dominant/non-dominant: Non-dominant Hemiplegia - caused by: Cerebral infarction     Time: 1325-1359 PT Time Calculation (min) (ACUTE ONLY): 34 min  Charges:  $Gait Training: 8-22 mins                     Masai Kidd A. Gilford Rile PT, DPT Acute Rehabilitation Services Pager [phone] Office [phone]    Linna Hoff 08/05/2020, 3:00 PM

## 2020-08-05 NOTE — Progress Notes (Signed)
Courtney Heys, MD   Physician  Nursing  PMR Pre-admission      Signed  Date of Service:  08/04/2020  3:54 PM       Related encounter: ED to Hosp-Admission (Current) from 07/28/2020 in [facility]       Signed                                                                                                                                                                                                                                                                                                                                                                                                                                                                                             PMR Admission Coordinator Pre-Admission Assessment   Patient: Jennifer Lucero is an 70 y.o., female MRN: [id number] DOB: [birth year] Height:   Weight: 96.7 kg   Insurance Information HMO:   yes   PPO:      PCP:      IPA:      80/20:      OTHER: PRIMARY: U[employer] Medicare       Policy#:  [id number]      Subscriber: pt  CM Name: none       Phone#: [phone]     Fax#: [phone] I received a call from Jan granting approval 7/14-7/20 with updates due in 7 days from admission. Pre-Cert#: [id number]      Employer: Benefits:  Phone: Online - [email or web address] Eff Date: 01/24/2020 - still active Deductible: $0 OOP Max: $7,550 ($0 met) CIR: $1480 admission copay SNF: $0.00 Copayment per day for days 1-20; $194.50 Copayment per day for days 21-100; Maximum of 100 days/benefit period Outpatient: 100% coverage Home Health:  100% coverage, 0% co-insurance; limited by medical necessity DME: 80% coverage; 20% co-insurance Providers: in network   SECONDARY:  Medicaid [place] Access  Eligible on 08/05/20 with coverage code [facility]   Policy#:  [id number]   Phone#:  [phone]   Financial Counselor:       Phone#:   The "Data Collection Information Summary" for patients in Inpatient Rehabilitation Facilities with attached "Privacy Act [place] Records" was provided and verbally reviewed with: Patient   Emergency Contact Information Contact Information       Name Relation Home Work Odem [phone]               Current Medical History  Patient Admitting Diagnosis: R CVA   History of Present Illness:  A59 year old right-handed female with history of atrial fibrillation reportedly on Eliquis, diabetes mellitus, hypertension hyperlipidemia.  Per chart review patient lives with spouse.  1 level home 4 steps to entry.  Reportedly independent prior to admission.  Presented 07/28/2020 to [facility] with acute onset of left-sided weakness and rightward gaze as well as aphasia.  Cranial CT scan showed right insular and Peri insular acute infarct as well as old left MCA infarction.  Patient did not receive tPA.  CT angiogram demonstrated right M2 occlusion.  Patient was transferred to [facility] underwent mechanical thrombectomy per interventional radiology.  She did remain intubated for a short time.  MRI follow-up showed evolving acute to early subacute right MCA distribution infarct associated petechial hemorrhage.  On 07/29/2020 around 3 AM there was question seizure activity EEG completed showing no seizure moderate diffuse encephalopathy patient was loaded with Keppra.  Echocardiogram with ejection fraction of 55 to 03% grade 1 diastolic dysfunction.  Neurology follow-up currently maintained on aspirin 325 mg daily for CVA prophylaxis.  Her Eliquis remained on hold given large right MCA infarct with hemorrhagic transformation awaiting to resume in approximately 10 to 14 days.  She was cleared to begin subcutaneous heparin for DVT prophylaxis.  Patient currently n.p.o. with alternative means of  nutritional support.  Bouts of agitation and restlessness maintained on Seroquel.  Therapy evaluations completed due to patient's left-sided weakness and decreased functional mobility was admitted for a comprehensive rehab program.   Complete NIHSS TOTAL: 17   Patient's medical record from [facility]  has been reviewed by the rehabilitation admission coordinator and physician.   Past Medical History      Past Medical History:  Diagnosis Date   Anemia     Angina pectoris ([place]) 10/24/2018   Anxiety 06/15/2017   Anxiety     Asthma 06/15/2017   Atrial fibrillation (HCC)     Chest pain 06/15/2017   Chronic anticoagulation 07/09/2014   DDD (degenerative disc disease), lumbar 01/13/2019    Last Assessment & Plan:  Formatting of this note might be different from the original. 70 year old female with chronic low back  and right-sided L2-4 radicular symptoms.  Recently updated MRI shows multilevel degeneration, level of greatest pathology at L3-4 where there is moderate broad-based disc osteophyte complex indents the thecal sac and combines with mild facet and ligamentum flavum hypertro   Diabetes (HCC)     Diabetic neuropathy (HCC) 06/15/2017   Esophagitis, reflux 06/15/2017   Essential hypertension 10/24/2018   GERD (gastroesophageal reflux disease)     History of pulmonary embolus (PE) 11/01/2017   Hyperlipidemia 06/15/2017   Insomnia 02/05/2019    Last Assessment & Plan:  Formatting of this note might be different from the original. Will trial trazodone 50-100 mg q.h.s..  Try 50 for 1 week and increase to 100 if no response, as tolerated.   Knee pain     Lumbago     Lumbar radiculopathy 01/13/2019    Last Assessment & Plan:  Formatting of this note might be different from the original. Discontinue gabapentin due to drowsiness and ataxia.  Continue with physical therapy as directed.   Osteoporosis 06/15/2017   Pulmonary embolism ([place]) 06/15/2017   Splenic infarction     Stroke [facility])  06/15/2017   Vitamin D deficiency        Family History   family history includes Diabetes in her brother; Heart disease in her mother.   Prior Rehab/Hospitalizations Has the patient had prior rehab or hospitalizations prior to admission? No   Has the patient had major surgery during 100 days prior to admission? Yes              Current Medications   Current Facility-Administered Medications:   0.9 %  sodium chloride infusion, , Intravenous, Continuous, Rosalin Hawking, MD, Last Rate: 25 mL/hr at 08/04/20 1240, New Bag at 08/04/20 1240   acetaminophen (TYLENOL) tablet 650 mg, 650 mg, Oral, Q4H PRN **OR** acetaminophen (TYLENOL) 160 MG/5ML solution 650 mg, 650 mg, Per Tube, Q4H PRN, 650 mg at 08/01/20 1833 **OR** acetaminophen (TYLENOL) suppository 650 mg, 650 mg, Rectal, Q4H PRN, Greta Doom, MD   aspirin tablet 325 mg, 325 mg, Per Tube, Daily, Rosalin Hawking, MD, 325 mg at 08/05/20 0848   chlorhexidine (PERIDEX) 0.12 % solution 15 mL, 15 mL, Mouth Rinse, BID, Greta Doom, MD, 15 mL at 08/05/20 0847   Chlorhexidine Gluconate Cloth 2 % PADS 6 each, 6 each, Topical, Q0600, Luanne Bras, MD, 6 each at 08/05/20 0603   feeding supplement (GLUCERNA 1.5 CAL) liquid 1,000 mL, 1,000 mL, Per Tube, Continuous, Rosalin Hawking, MD, Last Rate: 50 mL/hr at 08/04/20 1401, 1,000 mL at 08/04/20 1401   feeding supplement (PROSource TF) liquid 45 mL, 45 mL, Per Tube, Daily, Rosalin Hawking, MD, 45 mL at 08/05/20 0848   heparin injection 5,000 Units, 5,000 Units, Subcutaneous, Q8H, Rosalin Hawking, MD, 5,000 Units at 08/05/20 0601   insulin aspart (novoLOG) injection 0-15 Units, 0-15 Units, Subcutaneous, Q4H, Greta Doom, MD, 3 Units at 08/05/20 0849   insulin aspart (novoLOG) injection 2 Units, 2 Units, Subcutaneous, Q4H, ClarkVenita Sheffield, DO, 2 Units at 08/05/20 0849   insulin glargine (LANTUS) injection 20 Units, 20 Units, Subcutaneous, Daily, Chand, Currie Paris, MD, 20 Units at 08/05/20 0849    isosorbide dinitrate (ISORDIL) tablet 60 mg, 60 mg, Per Tube, BID, Noemi Chapel P, DO, 60 mg at 08/05/20 0850   labetalol (NORMODYNE) injection 10-20 mg, 10-20 mg, Intravenous, Q6H PRN, Jacky Kindle, MD, 20 mg at 08/04/20 1239   levETIRAcetam (KEPPRA) 100 MG/ML solution 500 mg, 500 mg, Per Tube, BID, Rosalin Hawking,  MD, 500 mg at 08/05/20 0848   lisinopril (ZESTRIL) tablet 20 mg, 20 mg, Per Tube, Daily, Rosalin Hawking, MD, 20 mg at 08/05/20 0848   MEDLINE mouth rinse, 15 mL, Mouth Rinse, q12n4p, Greta Doom, MD, 15 mL at 08/04/20 1603   metoprolol tartrate (LOPRESSOR) 25 mg/10 mL oral suspension 25 mg, 25 mg, Per Tube, BID, Donnetta Simpers, MD, 25 mg at 08/05/20 0850   multivitamin with minerals tablet 1 tablet, 1 tablet, Per Tube, Daily, Garvin Fila, MD, 1 tablet at 08/05/20 0848   pneumococcal 23 valent vaccine (PNEUMOVAX-23) injection 0.5 mL, 0.5 mL, Intramuscular, Tomorrow-1000, Kirkpatrick, Vida Roller, MD   pravastatin (PRAVACHOL) tablet 10 mg, 10 mg, Per Tube, Daily, Rosalin Hawking, MD, 10 mg at 08/05/20 0849   QUEtiapine (SEROQUEL) tablet 25 mg, 25 mg, Per Tube, QHS, Rosalin Hawking, MD, 25 mg at 08/04/20 2141   Patients Current Diet:  Diet Order                  Diet NPO time specified  Diet effective now                         Precautions / Restrictions Precautions Precautions: Fall Precaution Comments: Cortrak, posey, mitten, L below elbow amputation hx, L inattention Restrictions Weight Bearing Restrictions: No    Has the patient had 2 or more falls or a fall with injury in the past year? Yes   Prior Activity Level Community (5-7x/wk): Pt. was active in the community PTA   Prior Functional Level Self Care: Did the patient need help bathing, dressing, using the toilet or eating? Independent   Indoor Mobility: Did the patient need assistance with walking from room to room (with or without device)? Independent   Stairs: Did the patient need assistance with  internal or external stairs (with or without device)? Independent   Functional Cognition: Did the patient need help planning regular tasks such as shopping or remembering to take medications? Independent   Home Assistive Devices / Equipment Home Assistive Devices/Equipment: Cane (specify quad or straight), CBG Meter, Blood pressure cuff, Contact lenses Home Equipment: Cane - quad, Cane - single point, Shower seat   Prior Device Use: Indicate devices/aids used by the patient prior to current illness, exacerbation or injury? None of the above   Current Functional Level Cognition   Arousal/Alertness: Awake/alert Overall Cognitive Status: Difficult to assess Difficult to assess due to: Impaired communication Current Attention Level: Sustained Orientation Level: Oriented to person Following Commands: Follows one step commands inconsistently Safety/Judgement: Decreased awareness of safety, Decreased awareness of deficits General Comments: Patient non verbal. Follows up to 75% of simple 1 step commands throughout sesion and improved accuracy with tactile cues Attention: Sustained Sustained Attention: Impaired Sustained Attention Impairment: Functional basic Memory:  (will assess as able) Awareness: Impaired Awareness Impairment: Emergent impairment Problem Solving: Impaired Problem Solving Impairment: Functional basic Safety/Judgment: Impaired    Extremity Assessment (includes Sensation/Coordination)   Upper Extremity Assessment: LUE deficits/detail LUE Deficits / Details: residual limb (has prothesis but husband reports she does not usually use it), tightness, neglect, moving today, but stiffness with elbow ext +30* LUE Coordination: decreased gross motor  Lower Extremity Assessment: Defer to PT evaluation LLE Deficits / Details: Decreased strength noted with mobility; slow initiation, coordination deficits LLE Coordination: decreased gross motor     ADLs   Overall ADL's : Needs  assistance/impaired Eating/Feeding: NPO Eating/Feeding Details (indicate cue type and reason): total A Grooming: Moderate  assistance, Sitting, Minimal assistance, Standing Grooming Details (indicate cue type and reason): combing hair mostly R side; washing face mostly R side; cues required to attend to L side of face. stnading at sink for light grooming, mildly perseverative for washing R hand Upper Body Bathing: Total assistance, Sitting Lower Body Bathing: Total assistance Lower Body Bathing Details (indicate cue type and reason): Min A +2 sit<>stand Upper Body Dressing : Total assistance, Sitting Lower Body Dressing: Total assistance Lower Body Dressing Details (indicate cue type and reason): Min A +2 sit<>stand Toilet Transfer: Minimal assistance, +2 for physical assistance, Ambulation Toilet Transfer Details (indicate cue type and reason): Bil HHA Toileting- Clothing Manipulation and Hygiene: Moderate assistance, Sit to/from stand Toileting - Clothing Manipulation Details (indicate cue type and reason): Pt using RUE to attempt in standing at commode Functional mobility during ADLs: Minimal assistance, +2 for physical assistance, Cueing for safety, Cueing for sequencing General ADL Comments: Pt showing improvements today and requires multimodal cues to complete task, following commands 75% of time and using L side more than upon OT eval. Pt's vision appears to be improving with direct eye contact and use of compensatory strategies to look to L with cues 50% of the time.     Mobility   Overal bed mobility: Needs Assistance Bed Mobility: Supine to Sit, Sit to Supine, Rolling Rolling: Min assist Supine to sit: Min assist, +2 for physical assistance Sit to supine: Min assist General bed mobility comments: Patient with bowel movement on arrival requiring rolling L/R with minA. TotalA for pericare. minA+2 for OOB for trunk elevation and repositioning hips towards EOB. MinA to return to supine  with assist for L LE     Transfers   Overall transfer level: Needs assistance Equipment used: 2 person hand held assist Transfers: Sit to/from Stand Sit to Stand: Min assist, +2 safety/equipment General transfer comment: minA to rise from low bed surface and steady     Ambulation / Gait / Stairs / Wheelchair Mobility   Ambulation/Gait Ambulation/Gait assistance: Mod assist, +2 safety/equipment Gait Distance (Feet): 20 Feet Assistive device: 2 person hand held assist Gait Pattern/deviations: Step-through pattern, Decreased stride length General Gait Details: modA for balance and consistent cueing for direction. L inattention at times with close navigation to objects in room on L . Gait velocity: reduced Gait velocity interpretation: <1.8 ft/sec, indicate of risk for recurrent falls     Posture / Balance Balance Overall balance assessment: Needs assistance Sitting-balance support: No upper extremity supported, Feet supported Sitting balance-Leahy Scale: Fair Standing balance support: Bilateral upper extremity supported, During functional activity Standing balance-Leahy Scale: Poor Standing balance comment: requires UE support and external assist for ambulation     Special needs/care consideration Has been using restraints to keep tubes from being pulled out.  Has a coretrak.  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    Previous Home Environment (from acute therapy documentation) Living Arrangements: Spouse/significant other  Lives With: Spouse Available Help at Discharge: Family, Available 24 hours/day Type of Home: Mobile home Home Layout: One level Home Access: Stairs to enter Entrance Stairs-Rails: Right, Left, Can reach both Entrance Stairs-Number of Steps: 4 Bathroom Shower/Tub: Tub/shower unit, Multimedia programmer: Handicapped height [place]: No   Discharge Living Setting Plans for Discharge Living Setting: Patient's home Type of Home at Discharge:  Mobile home Discharge Home Layout: One level Discharge Home Access: Stairs to enter Entrance Stairs-Rails: Right, Left, Can reach both Entrance Stairs-Number of Steps: 4 Discharge Bathroom Shower/Tub: Tub/shower  unit Discharge Bathroom Toilet: Handicapped height Discharge Bathroom Accessibility: Yes Does the patient have any problems obtaining your medications?: No   Social/Family/Support Systems Patient Roles: Spouse Contact Information: [phone] Anticipated Caregiver: Xochilth Standish Anticipated Caregiver's Contact Information: [phone] Ability/Limitations of Caregiver: Can provide min-mod A Caregiver Availability: 24/7 Discharge Plan Discussed with Primary Caregiver: Yes Is Caregiver In Agreement with Plan?: Yes   Goals Patient/Family Goal for Rehab: PT/OT/SLP Supervision Pt/Family Agrees to Admission and willing to participate: Yes Program Orientation Provided & Reviewed with Pt/Caregiver Including Roles  & Responsibilities: Yes   Decrease burden of Care through IP rehab admission: Specialzed equipment needs, Decrease number of caregivers, Bowel and bladder program, and Patient/family education   Possible need for SNF placement upon discharge: not stated    Patient Condition: I have reviewed medical records from [facility], spoken with CM, and patient and spouse. I met with patient at the bedside for inpatient rehabilitation assessment.  Patient will benefit from ongoing PT, OT, and SLP, can actively participate in 3 hours of therapy a day 5 days of the week, and can make measurable gains during the admission.  Patient will also benefit from the coordinated team approach during an Inpatient Acute Rehabilitation admission.  The patient will receive intensive therapy as well as Rehabilitation physician, nursing, social worker, and care management interventions.  Due to bladder management, bowel management, safety, skin/wound care, disease management, medication  administration, pain management, and patient education the patient requires 24 hour a day rehabilitation nursing.  The patient is currently Min to mod assist with mobility and basic ADLs.  Discharge setting and therapy post discharge at home with home health is anticipated.  Patient has agreed to participate in the Acute Inpatient Rehabilitation Program and will admit today.   Preadmission Screen Completed By:  Retta Diones, 08/05/2020 12:02 PM ______________________________________________________________________   Discussed status with Dr. Dagoberto Ligas on 08/05/20 at 0930 and received approval for admission today.   Admission Coordinator:  Retta Diones, RN, time 1210/Date 08/05/20    Assessment/Plan: Diagnosis: Does the need for close, 24 hr/day Medical supervision in concert with the patient's rehab needs make it unreasonable for this patient to be served in a less intensive setting? Yes Co-Morbidities requiring supervision/potential complications: encephalopathy, L weakness; aphasia; R brain stroke s/p thrombectomy; seizures?; NPO/dysphagia- Cortrak; agitation Due to bladder management, bowel management, safety, skin/wound care, disease management, medication administration, pain management, and patient education, does the patient require 24 hr/day rehab nursing? Yes Does the patient require coordinated care of a physician, rehab nurse, PT, OT, and SLP to address physical and functional deficits in the context of the above medical diagnosis(es)? Yes Addressing deficits in the following areas: balance, endurance, locomotion, strength, transferring, bowel/bladder control, bathing, dressing, feeding, grooming, toileting, cognition, speech, language, and swallowing Can the patient actively participate in an intensive therapy program of at least 3 hrs of therapy 5 days a week? Yes The potential for patient to make measurable gains while on inpatient rehab is fair Anticipated functional outcomes upon  discharge from inpatient rehab: supervision and min assist PT, supervision and min assist OT, min assist SLP Estimated rehab length of stay to reach the above functional goals is: 20-24 days  Anticipated discharge destination: Home 10. Overall Rehab/Functional Prognosis: fair     MD Signature:            Revision History  Note Details  Jan Fireman, MD File Time 08/05/2020 12:56 PM  Author Type Physician Status Signed  Last Editor Courtney Heys, MD Service Nursing

## 2020-08-05 NOTE — Progress Notes (Signed)
Walked into pt room and she was standing up at the sink. I got her back in bed. Pt had an order for a non-violent restraint. I applied a soft restraint.  Around 9:40pm I was informed by sandra that pt pulled out cortrak. Pam was notified. New order was to give Diflucan and Keppra through IV. Hold other meds and insuline.

## 2020-08-05 NOTE — Progress Notes (Signed)
Inpatient Rehabilitation Medication Review by a Pharmacist  A complete drug regimen review was completed for this patient to identify any potential clinically significant medication issues.  Clinically significant medication issues were identified:  no  Check AMION for pharmacist assigned to patient if future medication questions/issues arise during this admission.  Pharmacist comments:   Time spent performing this drug regimen review (minutes):  20   Jennifer Lucero 08/05/2020 5:19 PM

## 2020-08-05 NOTE — Discharge Summary (Addendum)
Physician Discharge Summary  Patient ID: Jennifer Lucero MRN: [id number] DOB/AGE: 72/21/1952 72 y.o.  Admit date: 07/28/2020 Discharge date: 08/05/2020  Admission Diagnoses:  Discharge Diagnoses:  Active Problems:   Stroke (cerebrum) (HCC)   Middle cerebral artery embolism, right   Acute ischemic right MCA stroke [facility])   Discharged Condition: good  Hospital Course: Jennifer Lucero is a 70 y.o. female with history of A-fib on eliquis, DM2, HTN, HLD presenting with left sided weakness and rightward gaze preference.  CT head showed acute right MCA territory infarct.  CTA showed right M2 occlusion.  Now s/p thrombectomy with complete revascularization TICI 3.   Stroke - right MCA stroke secondary to right M2 occlusion s/p mechanical thrombectomy with TICI3 reperfusion in the setting of afib on Eliquis CT head acute right MCA stroke and old left MCA infarct. CTA head & neck: right M2 occlusion S/p IR with TICI3 Repeat CT head: Right insular and peri-insular acute infarct. Increased subarachnoid density at the right vertex. MRI  large right MCA infarct with petechial HT. Old left frontal and cerebellar infarcts 2D Echo EF 55-60% LDL 65 HgbA1c 9.6 VTE prophylaxis - SCDs aspirin 81 mg daily and Eliquis (apixaban) daily prior to admission, now on ASA 325mg . Consider to resume eliquis in 10-14 days post stroke given large infarct with Hemorrhagic transformation. Therapy recommendations: CIR Disposition:  insurance approved, she will d/c today   Seizures Seizure activity at approximately 3 am 7/7 Loaded with Keppra 2 grams IV EEG no seizure but cortical dysfunction at right hemisphere, moderate diffuse encephalopathy Continue Keppra 500 mg bid Monitor seizure activity   PAF On eliquis PTA Now eliquis on hold given large right MCA Infarct with HT On ASA 325 now Resume elqiuis in 10-14 days from stroke onset   Respiratory difficulty Extubated but difficulty controlling secretions  initially,now much improved CCM has s/o   Hypertension Home meds:  imdur 120mg  daily, lisinopril 20mg  daily, toprol XL 50mg  daily, Maxzide 37.5/25 Stable but on the high end Resume metoprolol and lisinopril SBP goal <160 given HT Hydralazine and labetalol prn Long-term BP goal normotensive   Hyperlipidemia Home meds:  Pravastatin 10mg  daily LDL 65, goal < 70 resumed pravastatin 10, no high intensity statin due to LDL at goal and advanced age Continue statin at discharge   Diabetes type II Uncontrolled Home meds:  levemir, metformin 500mg  bid, glimepiride 4mg  daily HgbA1c 9.6, goal < 7.0 CBGs SSI while inpt Resume home meds upon d/c to CIR; Rehab team can re-assess need for SSI or not PCP follow up closely   Leukocytosis, resolved WBC 12.6-14.6-12.1-12.6-10.4-10.6 UA negative CXR negative Close CBC monitoring   Dysphagia Did not pass swallow initially; anticipate repeat study while in CIR Cortrak placed; will need re-evaluation in CIR; may not need now that she is improving.  On tube feeding  Cre 1.21-1.30-1.08-1.04-1.04   Agitation and delirium, improving Improved Off restraints On seroquel 25 Qhs, keep active in daytime with shade open; calm, dark room at night.  Close monitoring   Other Stroke Risk Factors Advanced Age >/= 43 Remote ETOH use and drug use Obesity, Body mass index is 36.52 kg/m., BMI >/= 30 associated with increased stroke risk, recommend weight loss, diet and exercise as appropriate Hx stroke: left MCA infact, May 2019   Other Active Problems Diabetic neuropathy GERD: nexium 40mg  daily Hx of pulmonary embolus (11/01/2017) Splenic infarction anxiety  Consults: rehabilitation medicine and NIR  Significant Diagnostic Studies: labs: , microbiology: , radiology: , cardiac  graphics: , and angiography:   Treatments: IV hydration and Emergent thrombectomy  Discharge Exam: Blood pressure (!) 118/47, pulse 71, temperature 98.4 F (36.9 C),  temperature source Oral, resp. rate 20, weight 96.7 kg, SpO2 94 %. General - Well nourished, well developed, no acute distress or agitation Cardiovascular - irregularly irregular heart rate and rhythm, on tele sinus arrhythmia but not in afib.   Neuro - Alert, follows most commands, needs some prompting and at times she misses verbal que's. May have some receptive aphasia. Remains nonverbal. Not able to name or repeat. Eyes tracking bilaterally, no gaze palsy, blinking to visual threat on the right but not consistent on the left, PERRL. Left facial droop. Tongue protrusion not cooperative. RUEs 4/5, and LUE 3/5, left UE below elbow amputation. BLE proximal 3/5 and distal not cooperative on exam. Sensation and coordination not cooperative, gait not tested.  Disposition: Discharge disposition: [facility]-Rehab Facility        Allergies as of 08/05/2020       Reactions   Buspirone Other (See Comments)   "doesn't like the way it makes me feel" "doesn't like the way it makes me feel" Other reaction(s): Other (See Comments) "doesn't like the way it makes me feel"   Ciprofloxacin Nausea Only   Ciprofloxacin-fluocinolone Pf Nausea Only   Codeine Other (See Comments)   Shaking Anxiety Other reaction(s): Other (See Comments) Shaking Anxiety        Medication List     STOP taking these medications    ALPRAZolam 0.5 MG tablet Commonly known as: XANAX   apixaban 5 MG Tabs tablet Commonly known as: ELIQUIS   Centratex 106-1 MG Caps   esomeprazole 40 MG capsule Commonly known as: NEXIUM   fluticasone 50 MCG/ACT nasal spray Commonly known as: FLONASE   isosorbide mononitrate 120 MG 24 hr tablet Commonly known as: IMDUR   metoprolol succinate 50 MG 24 hr tablet Commonly known as: TOPROL-XL   nitroGLYCERIN 0.4 MG SL tablet Commonly known as: NITROSTAT   triamterene-hydrochlorothiazide 37.5-25 MG tablet Commonly known as: MAXZIDE-25       TAKE these medications     acetaminophen 325 MG tablet Commonly known as: TYLENOL Take 2 tablets (650 mg total) by mouth every 4 (four) hours as needed for mild pain (or temp > 37.5 C (99.5 F)).   albuterol 108 (90 Base) MCG/ACT inhaler Commonly known as: VENTOLIN HFA Inhale 2 puffs into the lungs every 4 (four) hours as needed for wheezing or shortness of breath.   aspirin 325 MG tablet Place 1 tablet (325 mg total) into feeding tube daily. Start taking on: August 06, 2020   feeding supplement (GLUCERNA 1.5 CAL) Liqd Place 1,000 mLs into feeding tube continuous.   gabapentin 300 MG capsule Commonly known as: NEURONTIN Take 300 mg by mouth at bedtime.   glimepiride 4 MG tablet Commonly known as: AMARYL Take 4 mg by mouth daily.   heparin 5000 UNIT/ML injection Inject 1 mL (5,000 Units total) into the skin every 8 (eight) hours.   insulin detemir 100 UNIT/ML FlexPen Commonly known as: LEVEMIR Inject 70 Units into the skin at bedtime.   isosorbide dinitrate 30 MG tablet Commonly known as: ISORDIL Place 2 tablets (60 mg total) into feeding tube 2 (two) times daily.   levETIRAcetam 100 MG/ML solution Commonly known as: KEPPRA Place 5 mLs (500 mg total) into feeding tube 2 (two) times daily.   lisinopril 20 MG tablet Commonly known as: ZESTRIL Take 20 mg by mouth daily.  loratadine 10 MG tablet Commonly known as: CLARITIN Take 1 tablet by mouth daily as needed for allergies.   metFORMIN 500 MG tablet Commonly known as: GLUCOPHAGE Take 500 mg by mouth 2 (two) times daily with a meal.   metoprolol tartrate 25 mg/10 mL Susp Commonly known as: LOPRESSOR Place 10 mLs (25 mg total) into feeding tube 2 (two) times daily.   multivitamin with minerals Tabs tablet Place 1 tablet into feeding tube daily. Start taking on: August 06, 2020   omeprazole 20 MG capsule Commonly known as: PRILOSEC Take 20 mg by mouth daily as needed for indigestion. Do not use at same time with Esomeprazole    pravastatin 10 MG tablet Commonly known as: PRAVACHOL Place 1 tablet (10 mg total) into feeding tube daily. Start taking on: August 06, 2020 What changed:  medication strength how to take this   QUEtiapine 25 MG tablet Commonly known as: SEROQUEL Place 1 tablet (25 mg total) into feeding tube at bedtime.   sucralfate 1 g tablet Commonly known as: CARAFATE Take 1 g by mouth 3 (three) times daily as needed (for stomach ache with meals).   tiZANidine 4 MG tablet Commonly known as: ZANAFLEX Take 2-4 mg by mouth 3 (three) times daily as needed for muscle spasms.   traMADol 50 MG tablet Commonly known as: ULTRAM Take 1 tablet by mouth daily as needed for moderate pain.   Vitamin D (Ergocalciferol) 1.25 MG (50000 UNIT) Caps capsule Commonly known as: DRISDOL Take 50,000 Units by mouth every Sunday.        Follow-up Information     Gage, John F., MD Follow up in 1 month(s).   Specialty: Family Medicine Contact information: [address] [place] [address] [phone]         Revankar, Rajan R, MD .   Specialty: Cardiology Contact information: [address] [place] [address] [phone]         [facility] Follow up.   Specialty: Neurology Contact information: [address] [place] [address] [phone]              35 min dc time spent  Signed: [place] 08/05/2020, 2:11 PM  ATTENDING NOTE: I reviewed above note and agree with the assessment and plan. Pt was seen and examined.   No acute event overnight, neuro stable, still has ansarthria, follow most simple commands on the right hand and bilateral foot.  Still nonverbal.  On tube feeding.  Stable for CIR discharge.  Continue aspirin for now, consider resume anticoagulation in 10 to 14 days post stroke for stroke prevention due to afib.  Continue pravastatin.  Patient will follow up with stroke clinic at [facility] in 4 weeks.  For detailed  assessment and plan, please refer to above as I have made changes wherever appropriate.   Rosalin Hawking, MD PhD Stroke Neurology 08/05/2020 6:54 PM
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
## 2020-08-05 NOTE — Progress Notes (Signed)
Patient arrived on unit, oriented to unit. Reviewed medications, therapy schedule, rehab routine and plan of care. Nods an understanding of information reviewed. No complications noted at this time. Patient s/s of pain and is Alert to self, expressive aphasia  Jennifer Lucero.

## 2020-08-06 ENCOUNTER — Inpatient Hospital Stay (HOSPITAL_COMMUNITY): Payer: Medicare Other

## 2020-08-06 DIAGNOSIS — I63511 Cerebral infarction due to unspecified occlusion or stenosis of right middle cerebral artery: Secondary | ICD-10-CM | POA: Diagnosis not present

## 2020-08-06 LAB — CBC WITH DIFFERENTIAL/PLATELET
Abs Immature Granulocytes: 0.13 10*3/uL — ABNORMAL HIGH (ref 0.00–0.07)
Basophils Absolute: 0.1 10*3/uL (ref 0.0–0.1)
Basophils Relative: 1 %
Eosinophils Absolute: 0.5 10*3/uL (ref 0.0–0.5)
Eosinophils Relative: 5 %
HCT: 32.3 % — ABNORMAL LOW (ref 36.0–46.0)
Hemoglobin: 10 g/dL — ABNORMAL LOW (ref 12.0–15.0)
Immature Granulocytes: 2 %
Lymphocytes Relative: 35 %
Lymphs Abs: 3.1 10*3/uL (ref 0.7–4.0)
MCH: 28.7 pg (ref 26.0–34.0)
MCHC: 31 g/dL (ref 30.0–36.0)
MCV: 92.6 fL (ref 80.0–100.0)
Monocytes Absolute: 1 10*3/uL (ref 0.1–1.0)
Monocytes Relative: 11 %
Neutro Abs: 4.2 10*3/uL (ref 1.7–7.7)
Neutrophils Relative %: 46 %
Platelets: 472 10*3/uL — ABNORMAL HIGH (ref 150–400)
RBC: 3.49 MIL/uL — ABNORMAL LOW (ref 3.87–5.11)
RDW: 15.2 % (ref 11.5–15.5)
WBC: 8.9 10*3/uL (ref 4.0–10.5)
nRBC: 0 % (ref 0.0–0.2)

## 2020-08-06 LAB — COMPREHENSIVE METABOLIC PANEL
ALT: 26 U/L (ref 0–44)
AST: 27 U/L (ref 15–41)
Albumin: 2.8 g/dL — ABNORMAL LOW (ref 3.5–5.0)
Alkaline Phosphatase: 58 U/L (ref 38–126)
Anion gap: 8 (ref 5–15)
BUN: 32 mg/dL — ABNORMAL HIGH (ref 8–23)
CO2: 28 mmol/L (ref 22–32)
Calcium: 9.3 mg/dL (ref 8.9–10.3)
Chloride: 105 mmol/L (ref 98–111)
Creatinine, Ser: 1.18 mg/dL — ABNORMAL HIGH (ref 0.44–1.00)
GFR, Estimated: 49 mL/min — ABNORMAL LOW (ref >60)
Glucose, Bld: 198 mg/dL — ABNORMAL HIGH (ref 70–99)
Potassium: 4.4 mmol/L (ref 3.5–5.1)
Sodium: 141 mmol/L (ref 135–145)
Total Bilirubin: 0.5 mg/dL (ref 0.3–1.2)
Total Protein: 6.5 g/dL (ref 6.5–8.1)

## 2020-08-06 LAB — GLUCOSE, CAPILLARY
Glucose-Capillary: 163 mg/dL — ABNORMAL HIGH (ref 70–99)
Glucose-Capillary: 164 mg/dL — ABNORMAL HIGH (ref 70–99)
Glucose-Capillary: 171 mg/dL — ABNORMAL HIGH (ref 70–99)
Glucose-Capillary: 173 mg/dL — ABNORMAL HIGH (ref 70–99)
Glucose-Capillary: 187 mg/dL — ABNORMAL HIGH (ref 70–99)
Glucose-Capillary: 206 mg/dL — ABNORMAL HIGH (ref 70–99)
Glucose-Capillary: 229 mg/dL — ABNORMAL HIGH (ref 70–99)

## 2020-08-06 MED ORDER — FREE WATER
200.0000 mL | Freq: Three times a day (TID) | Status: DC
  Administered 2020-08-06 – 2020-08-18 (×34): 200 mL

## 2020-08-06 MED ORDER — FLUCONAZOLE IN SODIUM CHLORIDE 100-0.9 MG/50ML-% IV SOLN
100.0000 mg | INTRAVENOUS | Status: DC
  Filled 2020-08-06 (×2): qty 50

## 2020-08-06 MED ORDER — GLUCERNA 1.5 CAL PO LIQD
1000.0000 mL | ORAL | Status: DC
  Administered 2020-08-06 – 2020-08-12 (×4): 1000 mL
  Filled 2020-08-06 (×11): qty 1000

## 2020-08-06 NOTE — IPOC Note (Signed)
Overall Plan of Care [facility]) Patient Details Name: Jennifer Lucero MRN: [id number] DOB: 01-03-1951  Admitting Diagnosis: Right middle cerebral artery stroke [facility])  Hospital Problems: Principal Problem:   Right middle cerebral artery stroke [facility]) Active Problems:   Aphasia   Dysphagia   Left hemiparesis ([place])     Functional Problem List: Nursing Bladder, Bowel, Nutrition, Safety, Medication Management, Endurance, Skin Integrity  PT Balance, Behavior, Edema, Endurance, Motor, Nutrition, Perception, Safety, Sensory  OT Cognition, Balance, Behavior, Perception, Safety, Endurance, Skin Integrity, Motor  SLP    TR         Basic ADL's: OT Eating, Grooming, Bathing, Dressing, Toileting     Advanced  ADL's: OT       Transfers: PT Bed Mobility, Bed to Chair, Car, Manufacturing systems engineer, Metallurgist: PT Ambulation, Emergency planning/management officer, Stairs     Additional Impairments: OT Fuctional Use of Upper Extremity  SLP        TR      Anticipated Outcomes Item Anticipated Outcome  Self Feeding S  Swallowing      Basic self-care  S  Toileting  S   Bathroom Transfers S  Bowel/Bladder  manage bowel with mod I assist and bladder with min assist  Transfers  CGA  Locomotion  CGA  Communication     Cognition     Pain     Safety/Judgment  maintain safety with cures/reminders   Therapy Plan: PT Intensity: Minimum of 1-2 x/day ,45 to 90 minutes PT Frequency: 5 out of 7 days PT Duration Estimated Length of Stay: 2.5-3 weeks OT Intensity: Minimum of 1-2 x/day, 45 to 90 minutes OT Frequency: 5 out of 7 days OT Duration/Estimated Length of Stay: 2 to 2.5 weeks     Due to the current state of emergency, patients may not be receiving their 3-hours of Medicare-mandated therapy.   Team Interventions: Nursing Interventions Patient/Family Education, Bowel Management, Skin Care/Wound Management, Medication Management, Disease Management/Prevention, Bladder  Management, Discharge Planning, Dysphagia/Aspiration Precaution Training  PT interventions Ambulation/gait training, Discharge planning, Functional mobility training, Psychosocial support, Therapeutic Activities, Visual/perceptual remediation/compensation, Balance/vestibular training, Disease management/prevention, Neuromuscular re-education, Skin care/wound management, Therapeutic Exercise, Wheelchair propulsion/positioning, Cognitive remediation/compensation, DME/adaptive equipment instruction, Pain management, Splinting/orthotics, UE/LE Strength taining/ROM, Community reintegration, Barrister's clerk education, Technical sales engineer stimulation, IT trainer, UE/LE Coordination activities  OT Interventions Training and development officer, Community reintegration, Disease mangement/prevention, Brewing technologist, Barrister's clerk education, Self Care/advanced ADL retraining, Therapeutic Exercise, UE/LE Coordination activities, Wheelchair propulsion/positioning, UE/LE Strength taining/ROM, Visual/perceptual remediation/compensation, Therapeutic Activities, Skin care/wound managment, Psychosocial support, Pain management, Functional mobility training, DME/adaptive equipment instruction, Discharge planning, Cognitive remediation/compensation  SLP Interventions    TR Interventions    SW/CM Interventions Psychosocial Support, Discharge Planning, Patient/Family Education, Disease Management/Prevention   Barriers to Discharge MD  Medical stability  Nursing Decreased caregiver support, Nutrition means, Incontinence 1 level 4 ste bil rails with spouse; has QC and SPC  PT Home environment access/layout, Decreased caregiver support, Incontinence, Behavior, Nutrition means    OT Other (comments) severity of expressive/receptive language deficits  SLP      SW       Team Discharge Planning: Destination: PT-Home ,OT- Home , SLP-  Projected Follow-up: PT-Home health PT, OT-  Home health OT, SLP-  Projected  Equipment Needs: PT-To be determined, OT- To be determined, SLP-  Equipment Details: PT- , OT-  Patient/family involved in discharge planning: PT- Patient,  OT-Patient unable/family or caregiver not available, SLP-   MD ELOS: 17 to 21 days  Medical Rehab Prognosis:  Good Assessment: 70 year old right-handed female with history of atrial fibrillation reportedly on Eliquis, diabetes mellitus, hypertension hyperlipidemia.  Per chart review patient lives with spouse.  1 level home 4 steps to entry.  Reportedly independent prior to admission.  Presented 07/28/2020 to [facility] with acute onset of left-sided weakness and rightward gaze as well as aphasia.  Cranial CT scan showed right insular and Peri insular acute infarct as well as old left MCA infarction.  Patient did not receive tPA.  CT angiogram demonstrated right M2 occlusion.  Patient was transferred to [facility] underwent mechanical thrombectomy per interventional radiology.  She did remain intubated for a short time.  MRI follow-up showed evolving acute to early subacute right MCA distribution infarct associated petechial hemorrhage.  On 07/29/2020 around 3 AM there was question seizure activity EEG completed showing no seizure moderate diffuse encephalopathy patient was loaded with Keppra.  Echocardiogram with ejection fraction of 55 to 73% grade 1 diastolic dysfunction.  Neurology follow-up currently maintained on aspirin 325 mg daily for CVA prophylaxis.  Her Eliquis remained on hold given large right MCA infarct with hemorrhagic transformation awaiting to resume in approximately 10 to 14 days.  She was cleared to begin subcutaneous heparin for DVT prophylaxis.  Patient currently n.p.o. with alternative means of nutritional support.  Bouts of agitation and restlessness maintained on Seroquel.  Therapy evaluations completed due to patient's left-sided weakness and decreased functional mobility was admitted for a comprehensive rehab  program.    See Team Conference Notes for weekly updates to the plan of care

## 2020-08-06 NOTE — Progress Notes (Signed)
Physical Therapy Session Note  Patient Details  Name: Jennifer Lucero MRN: [id number] Date of Birth: 08/20/1950  Today's Date: 08/06/2020 PT Individual Time: 1517-6160 PT Individual Time Calculation (min): 31 min   Short Term Goals: Week 1:  PT Short Term Goal 1 (Week 1): Pt will consistently complete rolling ModA PT Short Term Goal 2 (Week 1): Pt will consistently complete supine <> sit ModA PT Short Term Goal 3 (Week 1): Pt will ambulate 152ft MinA PT Short Term Goal 4 (Week 1): Pt will improve attention towards Left environment requiring cuing for attention <75% of the time PT Short Term Goal 5 (Week 1): Pt will complete stairs with MinA  Skilled Therapeutic Interventions/Progress Updates:  Patient sidelying in bed on entrance to room. Two RNs present to apply MD ordered interdry microbial fabric to skinfolds at skin irritation noted on pt's bottom. However, since pt is incontinent, fabric deemed inappropriate for MD intended use. Patient alert and agreeable to PT session through nodding of head when therapist introduces self. Patient relates through hand gestures that she is having headache pain. Related to pt that RN to be notified and will most likely bring medication with pt nodding.   Therapeutic Activity: Bed Mobility: Patient requires extensive multimodal cueing to initiate supine <> sit this session. Supine-->sit performed with pt sitting straight up from supine positioning and bringing BLE over EOB with supervision/ CGA. VC/ tc required for attempt to correct technique. Pt shaking head. Sitting balance challenged with reaching task and able to grasp tissue box from floor with R hand and CGA from EOB. Return to supine at end of session requiring Mod/ Max A with pt initiating UB to bed surface and then freezing requiring physical assist to complete. Upon reaching sidelying, pt requires max cueing to roll to supine with Mod A. Moved toward [facility] with Max A and use of bed controls.   Transfers: Patient performed STS and SPVT transfers mostly impulsively throughout session with CGA/ supervision. Some STS from EOB performed on verbal instruction to perform. Provided verbal cues for technique in forward lean.  Neuromuscular Re-ed: NMR facilitated during session with focus on performance of aspects of Berg Balance Test. Pt is able to follow verbal/ visual instructions ~80% of completed tasks. Pt completes static stance, EC static stance, NBOS stance, Tandem stance, 360 deg turns, picking up item from floor, forward reach within treatment time of session. Requires multimodal cues at times to perform correctly with few impulsive movements, mostly to sit EOB for rest. RW provided for as needed UE support to RUE. Pt guided in forward/ backward stepping covering 8 feet with minimal cueing required for technique.   To complete Berg Balance, guide pt through looking over each shoulder, toe tapping to step, and single leg stance at next session.  NMR performed for improvements in motor control and coordination, balance, sequencing, judgement, and self confidence/ efficacy in performing all aspects of mobility at highest level of independence.   Patient supine  in bed at end of session with brakes locked, bed alarm set, security belt attached, and all needs within reach.     Therapy Documentation Precautions:  Precautions Precautions: Fall Precaution Comments: Cortrak, global aphasia, L below elbow amputation hx, L inattention Restrictions Weight Bearing Restrictions: No  Therapy/Group: Individual Therapy  Alger Simons PT, DPT  08/06/2020, 6:30 PM

## 2020-08-06 NOTE — Progress Notes (Signed)
Patient ID: Jennifer Lucero, female   DOB: [birth year], 71 y.o.   MRN: [id number] Met with the patient to introduce self and the role of the nurse CM. Patient is alert with aphasia. Nods to questions. Staff noted patient was restless last PM and got out of bed, not redirectable and unsafe to be at the end of the hall, so they have moved her to a room closer to the nursing station. Patient pulled out feeding tube, awaiting replacement. Patient able to spell name with hand and acknowledge she is not doing too well this morning but unable to give specifics. Denies pain. Continue to follow along to discharge to review questions, medications and facilitate preparation for discharge. Will need to review information with spouse due to patient's cognitive and communication status. Margarito Liner

## 2020-08-06 NOTE — Discharge Instructions (Addendum)
Inpatient Rehab Discharge Instructions  Jennifer Lucero Discharge date and time: No discharge date for patient encounter.   Activities/Precautions/ Functional Status: Activity: activity as tolerated Diet: Osmolite 237 mL 3 times daily by tube.  Free water 200 mL 3 times daily by tube Wound Care: Routine skin checks Functional status:  ___ No restrictions     ___ Walk up steps independently ___ 24/7 supervision/assistance   ___ Walk up steps with assistance ___ Intermittent supervision/assistance  ___ Bathe/dress independently ___ Walk with walker     _x__ Bathe/dress with assistance ___ Walk Independently    ___ Shower independently ___ Walk with assistance    ___ Shower with assistance ___ No alcohol     ___ Return to work/school ________  COMMUNITY REFERRALS UPON DISCHARGE:    Home Health:   PT     OT     ST    RN                   Agency: [facility] Phone:  [phone]   Medical Equipment/Items Ordered: Hospital Bed, Suction Package, Bedside Commode, Producer, television/film/video, Wheelchair                                                 Agency/Supplier: [employer]   Special Instructions:  Abdominal binder to protect PEG tube  No driving smoking or alcoholSTROKE/TIA DISCHARGE INSTRUCTIONS SMOKING Cigarette smoking nearly doubles your risk of having a stroke & is the single most alterable risk factor  If you smoke or have smoked in the last 12 months, you are advised to quit smoking for your health. Most of the excess cardiovascular risk related to smoking disappears within a year of stopping. Ask you doctor about anti-smoking medications [place] Quit Line: 1-800-QUIT NOW Free Smoking Cessation Classes [phone]  CHOLESTEROL Know your levels; limit fat & cholesterol in your diet  Lipid Panel     Component Value Date/Time   CHOL 129 07/29/2020 0455   TRIG 84 07/29/2020 0455   HDL 47 07/29/2020 0455   CHOLHDL 2.7 07/29/2020 0455   VLDL 17 07/29/2020 0455   LDLCALC  65 07/29/2020 0455     Many patients benefit from treatment even if their cholesterol is at goal. Goal: Total Cholesterol (CHOL) less than 160 Goal:  Triglycerides (TRIG) less than 150 Goal:  HDL greater than 40 Goal:  LDL (LDLCALC) less than 100   BLOOD PRESSURE American Stroke Association blood pressure target is less that 120/80 mm/Hg  Your discharge blood pressure is:  BP: (!) 143/58 Monitor your blood pressure Limit your salt and alcohol intake Many individuals will require more than one medication for high blood pressure  DIABETES (A1c is a blood sugar average for last 3 months) Goal HGBA1c is under 7% (HBGA1c is blood sugar average for last 3 months)  Diabetes:    Lab Results  Component Value Date   HGBA1C 9.6 (H) 07/29/2020    Your HGBA1c can be lowered with medications, healthy diet, and exercise. Check your blood sugar as directed by your physician Call your physician if you experience unexplained or low blood sugars.  PHYSICAL ACTIVITY/REHABILITATION Goal is 30 minutes at least 4 days per week  Activity: Increase activity slowly, Therapies: Physical Therapy: Home Health Return to work:  Activity decreases your risk of heart attack and stroke and  makes your heart stronger.  It helps control your weight and blood pressure; helps you relax and can improve your mood. Participate in a regular exercise program. Talk with your doctor about the best form of exercise for you (dancing, walking, swimming, cycling).  DIET/WEIGHT Goal is to maintain a healthy weight  Your discharge diet is:  Diet Order             Diet NPO time specified  Diet effective now                   liquids Your height is:    Your current weight is:   Your Body Mass Index (BMI) is:    Following the type of diet specifically designed for you will help prevent another stroke. Your goal weight range is:   Your goal Body Mass Index (BMI) is 19-24. Healthy food habits can help reduce 3 risk factors  for stroke:  High cholesterol, hypertension, and excess weight.  RESOURCES Stroke/Support Group:  Call [phone]   STROKE EDUCATION PROVIDED/REVIEWED AND GIVEN TO PATIENT Stroke warning signs and symptoms How to activate emergency medical system (call 911). Medications prescribed at discharge. Need for follow-up after discharge. Personal risk factors for stroke. Pneumonia vaccine given: No Flu vaccine given: No My questions have been answered, the writing is legible, and I understand these instructions.  I will adhere to these goals & educational materials that have been provided to me after my discharge from the hospital.       My questions have been answered and I understand these instructions. I will adhere to these goals and the provided educational materials after my discharge from the hospital.  Patient/Caregiver Signature _______________________________ Date __________  Clinician Signature _______________________________________ Date __________  Please bring this form and your medication list with you to all your follow-up doctor's appointments.

## 2020-08-06 NOTE — Plan of Care (Signed)
  Problem: RH Functional Use of Upper Extremity Goal: LTG Patient will use RT/LT upper extremity as a (OT) Description: LTG: Patient will use right/left upper extremity as a stabilizer/gross assist/diminished/nondominant/dominant level with assist, with/without cues during functional activity (OT) Flowsheets (Taken 08/06/2020 1459) LTG: Use of upper extremity in functional activities: LUE as gross assist level LTG: Pt will use upper extremity in functional activity with assistance level of: Supervision/Verbal cueing
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
## 2020-08-06 NOTE — Procedures (Signed)
Cortrak  Person Inserting Tube:  Jennifer Lucero, Jennifer Lucero, RD Tube Type:  Cortrak - 43 inches Tube Size:  10 Tube Location:  Right nare Initial Placement:  Stomach Secured by: Bridle Technique Used to Measure Tube Placement:  Marking at nare/corner of mouth Cortrak Secured At:  64 cm Cortrak Tube Team Note:  Consult received to place a Cortrak feeding tube.   X-ray is required, abdominal x-ray has been ordered by the Cortrak team. Please confirm tube placement before using the Cortrak tube.   If the tube becomes dislodged please keep the tube and contact the Cortrak team at [email or web address] (password [email or web address]) for replacement.  If after hours and replacement cannot be delayed, place a NG tube and confirm placement with an abdominal x-ray.    [name], MS, RD, LDN (she/her/hers) RD pager number and weekend/on-call pager number located in [place].

## 2020-08-06 NOTE — Progress Notes (Signed)
Inpatient Rehabilitation  Patient information reviewed and entered into eRehab system by Vijay Durflinger Amariyon Maynes, OTR/L.   Information including medical coding, functional ability and quality indicators will be reviewed and updated through discharge.    

## 2020-08-06 NOTE — Plan of Care (Signed)
  Problem: RH Swallowing Goal: LTG Patient will participate in dysphagia therapy to increase swallow function with assistance (SLP) Description: LTG:  Patient will participate in dysphagia therapy to increase swallow function with assistance (SLP) Flowsheets (Taken 08/06/2020 1816) LTG: Pt will participate in dysphagia therapy to increase swallow function with assistance of (SLP): Moderate Assistance - Patient 50 - 74% Goal: LTG Pt will demonstrate functional change in swallow as evidenced by bedside/clinical objective assessment (SLP) Description: LTG: Patient will demonstrate functional change in swallow as evidenced by bedside/clinical objective assessment (SLP) Flowsheets (Taken 08/06/2020 1816) LTG: Patient will demonstrate functional change in swallow as evidenced by bedside/clinical objective assessment: Oropharyngeal swallow   Problem: RH Comprehension Communication Goal: LTG Patient will comprehend basic/complex auditory (SLP) Description: LTG: Patient will comprehend basic/complex auditory information with cues (SLP). Flowsheets (Taken 08/06/2020 1816) LTG: Patient will comprehend: Basic auditory information LTG: Patient will comprehend auditory information with cueing (SLP): Moderate Assistance - Patient 50 - 74%   Problem: RH Expression Communication Goal: LTG Patient will express needs/wants via multi-modal(SLP) Description: LTG:  Patient will express needs/wants via multi-modal communication (gestures/written, etc) with cues (SLP) Flowsheets (Taken 08/06/2020 1816) LTG: Patient will express needs/wants via multimodal communication (gestures/written, etc) with cueing (SLP): Moderate Assistance - Patient 50 - 74%

## 2020-08-06 NOTE — Plan of Care (Signed)
  Problem: RH Balance Goal: LTG: Patient will maintain dynamic sitting balance (OT) Description: LTG:  Patient will maintain dynamic sitting balance with assistance during activities of daily living (OT) Flowsheets (Taken 08/06/2020 1234) LTG: Pt will maintain dynamic sitting balance during ADLs with: Supervision/Verbal cueing Goal: LTG Patient will maintain dynamic standing with ADLs (OT) Description: LTG:  Patient will maintain dynamic standing balance with assist during activities of daily living (OT)  Flowsheets (Taken 08/06/2020 1234) LTG: Pt will maintain dynamic standing balance during ADLs with: Supervision/Verbal cueing   Problem: Sit to Stand Goal: LTG:  Patient will perform sit to stand in prep for activites of daily living with assistance level (OT) Description: LTG:  Patient will perform sit to stand in prep for activites of daily living with assistance level (OT) Flowsheets (Taken 08/06/2020 1234) LTG: PT will perform sit to stand in prep for activites of daily living with assistance level: Supervision/Verbal cueing   Problem: RH Eating Goal: LTG Patient will perform eating w/assist, cues/equip (OT) Description: LTG: Patient will perform eating with assist, with/without cues using equipment (OT) Flowsheets (Taken 08/06/2020 1234) LTG: Pt will perform eating with assistance level of: Supervision/Verbal cueing   Problem: RH Grooming Goal: LTG Patient will perform grooming w/assist,cues/equip (OT) Description: LTG: Patient will perform grooming with assist, with/without cues using equipment (OT) Flowsheets (Taken 08/06/2020 1234) LTG: Pt will perform grooming with assistance level of: Supervision/Verbal cueing   Problem: RH Bathing Goal: LTG Patient will bathe all body parts with assist levels (OT) Description: LTG: Patient will bathe all body parts with assist levels (OT) Flowsheets (Taken 08/06/2020 1234) LTG: Pt will perform bathing with assistance level/cueing:  Supervision/Verbal cueing   Problem: RH Dressing Goal: LTG Patient will perform upper body dressing (OT) Description: LTG Patient will perform upper body dressing with assist, with/without cues (OT). Flowsheets (Taken 08/06/2020 1234) LTG: Pt will perform upper body dressing with assistance level of: Supervision/Verbal cueing Goal: LTG Patient will perform lower body dressing w/assist (OT) Description: LTG: Patient will perform lower body dressing with assist, with/without cues in positioning using equipment (OT) Flowsheets (Taken 08/06/2020 1234) LTG: Pt will perform lower body dressing with assistance level of: Supervision/Verbal cueing   Problem: RH Toileting Goal: LTG Patient will perform toileting task (3/3 steps) with assistance level (OT) Description: LTG: Patient will perform toileting task (3/3 steps) with assistance level (OT)  Flowsheets (Taken 08/06/2020 1234) LTG: Pt will perform toileting task (3/3 steps) with assistance level: Supervision/Verbal cueing   Problem: RH Toilet Transfers Goal: LTG Patient will perform toilet transfers w/assist (OT) Description: LTG: Patient will perform toilet transfers with assist, with/without cues using equipment (OT) Flowsheets (Taken 08/06/2020 1234) LTG: Pt will perform toilet transfers with assistance level of: Supervision/Verbal cueing   Problem: RH Tub/Shower Transfers Goal: LTG Patient will perform tub/shower transfers w/assist (OT) Description: LTG: Patient will perform tub/shower transfers with assist, with/without cues using equipment (OT) Flowsheets (Taken 08/06/2020 1234) LTG: Pt will perform tub/shower stall transfers with assistance level of: Supervision/Verbal cueing

## 2020-08-06 NOTE — Progress Notes (Signed)
Inpatient [facility] Individual Statement of Services  Patient Name:  Jennifer Lucero  Date:  08/06/2020  Welcome to the [facility].  Our goal is to provide you with an individualized program based on your diagnosis and situation, designed to meet your specific needs.  With this comprehensive rehabilitation program, you will be expected to participate in at least 3 hours of rehabilitation therapies Monday-Friday, with modified therapy programming on the weekends.  Your rehabilitation program will include the following services:  Physical Therapy (PT), Occupational Therapy (OT), Speech Therapy (ST), 24 hour per day rehabilitation nursing, Therapeutic Recreaction (TR), Neuropsychology, Care Coordinator, Rehabilitation Medicine, Nutrition Services, Pharmacy Services, and Other  Weekly team conferences will be held on Wednesdays to discuss your progress.  Your Inpatient Rehabilitation Care Coordinator will talk with you frequently to get your input and to update you on team discussions.  Team conferences with you and your family in attendance may also be held.  Expected length of stay: 2.5-3 Weeks  Overall anticipated outcome: Supervision to Min A  Depending on your progress and recovery, your program may change. Your Inpatient Rehabilitation Care Coordinator will coordinate services and will keep you informed of any changes. Your Inpatient Rehabilitation Care Coordinator's name and contact numbers are listed  below.  The following services may also be recommended but are not provided by the [facility]:   [place] will be made to provide these services after discharge if needed.  Arrangements include referral to agencies that provide these services.  Your insurance has been verified to be:  [facility] Medicare Your primary doctor is:  Wende Neighbors, MD  Pertinent information  will be shared with your doctor and your insurance company.  Inpatient Rehabilitation Care Coordinator:  Erlene Quan, [place] or [phone]  Information discussed with and copy given to patient by: [name], 08/06/2020, 11:33 AM

## 2020-08-06 NOTE — Progress Notes (Signed)
PROGRESS NOTE   Subjective/Complaints: Patient pulled out core track also was out of bed yesterday. Patient with severe expressive aphasia as well as dysarthria.  She is able to follow simple commands Review of systems limited by aphasia, dysarthria  Objective:   No results found. Recent Labs    08/05/20 1620 08/06/20 0530  WBC 10.4 8.9  HGB 10.1* 10.0*  HCT 33.0* 32.3*  PLT 448* 472*   Recent Labs    08/05/20 1620 08/06/20 0530  NA  --  141  K  --  4.4  CL  --  105  CO2  --  28  GLUCOSE  --  198*  BUN  --  32*  CREATININE 1.28* 1.18*  CALCIUM  --  9.3    Intake/Output Summary (Last 24 hours) at 08/06/2020 0858 Last data filed at 08/06/2020 0300 Gross per 24 hour  Intake 0 ml  Output --  Net 0 ml        Physical Exam: Vital Signs Blood pressure (!) 143/58, pulse 71, temperature 98.4 F (36.9 C), temperature source Oral, resp. rate 20, SpO2 94 %.  General: No acute distress Mood and affect are appropriate Heart: Regular rate and rhythm no rubs murmurs or extra sounds Lungs: Clear to auscultation, breathing unlabored, no rales or wheezes Abdomen: Positive bowel sounds, soft nontender to palpation, nondistended Extremities: No clubbing, cyanosis, or edema Skin: No evidence of breakdown, no evidence of rash Neurologic: Cranial nerves II through XII intact, motor strength is 5/5 in right and 4/5 left deltoid, bicep, tricep, grip, hip flexor, knee extensors, ankle dorsiflexor and plantar flexor  Musculoskeletal: Full range of motion in all 4 extremities, no pain with upper extremity or lower extremity range of motion.. No joint swelling Status post left hand amputation, chronic   Assessment/Plan: 1. Functional deficits which require 3+ hours per day of interdisciplinary therapy in a comprehensive inpatient rehab setting. Physiatrist is providing close team supervision and 24 hour management of active  medical problems listed below. Physiatrist and rehab team continue to assess barriers to discharge/monitor patient progress toward functional and medical goals  Care Tool:  Bathing              Bathing assist       Upper Body Dressing/Undressing Upper body dressing        Upper body assist      Lower Body Dressing/Undressing Lower body dressing            Lower body assist       Toileting Toileting    Toileting assist       Transfers Chair/bed transfer  Transfers assist           Locomotion Ambulation   Ambulation assist              Walk 10 feet activity   Assist           Walk 50 feet activity   Assist           Walk 150 feet activity   Assist           Walk 10 feet on uneven surface  activity   Assist  Wheelchair     Assist               Wheelchair 50 feet with 2 turns activity    Assist            Wheelchair 150 feet activity     Assist          Blood pressure (!) 143/58, pulse 71, temperature 98.4 F (36.9 C), temperature source Oral, resp. rate 20, SpO2 94 %.   Medical Problem List and Plan: 1.  Side weakness with aphasia/dysphagia secondary to right MCA infarction secondary to right M2 occlusion status post mechanical thrombectomy             -patient may shower once Tfs aren't continuous             -ELOS/Goals: 2.5-3 weeks supervision to min A 2.  Antithrombotics: -DVT/anticoagulation: Subcutaneous heparin             -antiplatelet therapy: Aspirin 325 mg daily.  Consider to resume Eliquis 10 to 14 days given large infarct with hemorrhagic transformation as noted per neurology services. 3. Pain Management: Tylenol as needed 4. Mood: Provide emotional support             -antipsychotic agents: Seroquel 25 mg nightly 5. Neuropsych: This patient is not capable of making decisions on her own behalf. 6. Skin/Wound Care: Routine skin checks 7.  Fluids/Electrolytes/Nutrition: Routine in and outs with follow-up chemistries 8.  Seizure prophylaxis.  EEG negative.  Keppra 500 mg twice daily. 9.  Dysphagia.  NPO.  Alternative means of nutritional support.  Dietary follow-up- con't Cortrak for now- if doesn't improve, might need PEG.  Poor oral movement, tongue protruding poor prognosis for p.o. feeds thus far Patient pulled feeding tube, this was reinserted on 08/06/2020, will need wrist restraints on the right side 10.  Atrial fibrillation.  Lopressor 25 mg twice daily.  Await plans to resume Eliquis 11.  Hypertension.  Isordil 60 mg twice daily, lisinopril 20 mg daily.  Monitor with increased mobility 12.  Diabetes mellitus.  Hemoglobin A1c 9.6.  NovoLog 2 units every 4 hours, Lantus insulin 20 units daily.  Check blood sugars before meals and at bedtime CBG (last 3)  Recent Labs    08/06/20 0406 08/06/20 0811 08/06/20 1204  GLUCAP 171* 187* 206*  Will increase Lantus to 25 units. 13.  Hyperlipidemia.  Pravachol 14. Expressive aphasia- needs communication board. 15. Urinary incontinence- having since stroke- might benefit initially from [place] at NIGHT- explained working on toileting program during day.  16.  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

## 2020-08-06 NOTE — Progress Notes (Signed)
Initial Nutrition Assessment  DOCUMENTATION CODES:   Obesity unspecified  INTERVENTION:  Once Cortrak NGT ready for nutrition use, Initiate Glucerna 1.5 cal formula at new goal rate of 60 ml/hr x 20 hours (may hold TF for up to 4 hours for therapy)  Provide 45 ml Prosource TF once daily per tube.   Provide free water flushes of 200 ml TID per tube.   Tube feeding regimen provides 1840 kcal, 110 grams of protein, and 1512 ml free water.   NUTRITION DIAGNOSIS:   Inadequate oral intake related to inability to eat as evidenced by NPO status.  GOAL:   Patient will meet greater than or equal to 90% of their needs  MONITOR:   TF tolerance, Skin, Weight trends, Labs, I & O's  REASON FOR ASSESSMENT:   Consult Enteral/tube feeding initiation and management  ASSESSMENT:   70 year old female with history of atrial fibrillation, diabetes mellitus, hypertension hyperlipidemia presents with acute onset of left-sided weakness and rightward gaze as well as aphasia.  Cranial CT scan showed right insular and Peri insular acute infarct as well as old left MCA infarction. CT angiogram demonstrated right M2 occlusion. Underwent mechanical thrombectomy. MRI follow-up showed evolving acute to early subacute right MCA distribution infarct associated petechial hemorrhage. Patient currently n.p.o. with alternative means of nutritional support. Pt with left-sided weakness and decreased functional mobility was admitted for a comprehensive rehab program.  Pt continues on NPO status. Pt with aphasia however able to nod yes or no. Pt has been tolerating her tube feeds. Cortrak pulled out last night. Cortrak to be replaced today. Tube feeds to be restarted once Cortrak NGT ready for nutrition use. RD to modify tube feeding orders to allow feedings to be infused over 20 hours to allow for TF to held for up to 4 hours for therapy.   NUTRITION - FOCUSED PHYSICAL EXAM:  Flowsheet Row Most Recent Value  Orbital  Region No depletion  Upper Arm Region No depletion  Thoracic and Lumbar Region No depletion  Buccal Region No depletion  Temple Region No depletion  Clavicle Bone Region No depletion  Clavicle and Acromion Bone Region No depletion  Scapular Bone Region No depletion  Dorsal Hand No depletion  Patellar Region No depletion  Anterior Thigh Region No depletion  Posterior Calf Region No depletion  Edema (RD Assessment) None  Hair Reviewed  Eyes Reviewed  Mouth Reviewed  Skin Reviewed  Nails Reviewed      Labs and medications reviewed.   Diet Order:   Diet Order             Diet NPO time specified  Diet effective now                   EDUCATION NEEDS:   Not appropriate for education at this time  Skin:  Skin Assessment: Reviewed RN Assessment  Last BM:  7/14  Height:   Ht Readings from Last 1 Encounters:  05/12/20 5\' 4"  (1.626 m)    Weight:   Wt Readings from Last 1 Encounters:  08/05/20 96.7 kg   BMI:  There is no height or weight on file to calculate BMI.  Estimated Nutritional Needs:   Kcal:  1800-2000  Protein:  95-110 grams  Fluid:  >/=1.8 L/day  Corrin Parker, MS, RD, LDN RD pager number/after hours weekend pager number on Amion.
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
## 2020-08-07 DIAGNOSIS — I63511 Cerebral infarction due to unspecified occlusion or stenosis of right middle cerebral artery: Secondary | ICD-10-CM | POA: Diagnosis not present

## 2020-08-07 LAB — GLUCOSE, CAPILLARY
Glucose-Capillary: 236 mg/dL — ABNORMAL HIGH (ref 70–99)
Glucose-Capillary: 176 mg/dL — ABNORMAL HIGH (ref 70–99)
Glucose-Capillary: 196 mg/dL — ABNORMAL HIGH (ref 70–99)
Glucose-Capillary: 205 mg/dL — ABNORMAL HIGH (ref 70–99)
Glucose-Capillary: 171 mg/dL — ABNORMAL HIGH (ref 70–99)

## 2020-08-07 MED ORDER — FLUCONAZOLE 100MG IVPB
100.0000 mg | INTRAVENOUS | Status: DC
  Administered 2020-08-07 – 2020-08-10 (×4): 100 mg via INTRAVENOUS
  Filled 2020-08-07 (×6): qty 50

## 2020-08-07 NOTE — Progress Notes (Signed)
Pt was found with skin tear last night. Unclear of source. Wound cleansed NS pat dried and steri strips applied.

## 2020-08-07 NOTE — Progress Notes (Signed)
Physical Therapy Session Note  Patient Details  Name: Jennifer Lucero MRN: [id number] Date of Birth: 05-03-1950  Today's Date: 08/07/2020 PT Individual Time: 9357-0177 ; 9390-3009 PT Individual Time Calculation (min): 38 min, 56 min  Short Term Goals: Week 1:  PT Short Term Goal 1 (Week 1): Pt will consistently complete rolling ModA PT Short Term Goal 2 (Week 1): Pt will consistently complete supine <> sit ModA PT Short Term Goal 3 (Week 1): Pt will ambulate 131ft MinA PT Short Term Goal 4 (Week 1): Pt will improve attention towards Left environment requiring cuing for attention <75% of the time PT Short Term Goal 5 (Week 1): Pt will complete stairs with MinA   Skilled Therapeutic Interventions/Progress Updates:    First session: Pt asleep supine in bed at start of therapy. Pt awakes to name and arm rub and nodded "yes" to therapy. Pt supine to sit ModA with multimodal cuing. Pt demonstrated good sitting balance while therapist donned pants.   Transfers <> wc MinA for balance and large visual gestures for target surface. Pt requires multimodal cuing to attend to LLE with placement on leg rest consistently throughout therapy.   Seated balance with feet unsupported reaching for 8 cones. Pt demonstrates good weight shifting with reaching cross body/ipsilaterally with varying heights and angles. Multimodal cuing for attention to objects placed on the left. Rounded shoulders, forward head, and posterior pelvic tilt in sitting. Multimodal cuing for upright posture in sitting that can be moderately improved, however, quickly needs repeat cuing.   Pt ambulated 54ft HHA on IV pole MinA for safety with verbal/visual cuing for environment navigation and scanning to left in busy hall before needed to sit. Pt wheeled back to room for time management.    Pt supine HOB elevated with waist restraint, RUE mit restraint, bed alarm on, and cotrack/IV batteries plugged back in. MD bedside.   Second Session:   Pt asleep supine in bed at start of therapy. Pt awakes to name and arm rub and nodded "yes" to therapy.  Verbal and large visual cue for supine >sit towards left with MinA for terminal trunk transition to midline secondary to decreased leverage with LUE post wrist amputation. Pt demonstrated ability to doff pants and donn brief/pants in standing requiring modA for setup/management of left side.  STS throughout session with CGA for safety. Transfers <> wc MinA for balance and large visual gestures for target surface. Pt requires multimodal cuing to attend to LLE with placement on leg rest consistently throughout therapy.   BITS visual scanning task completed for increased attention towards left environment. Pt required multimodal cuing for attending to the left ~90% of the time. One round completed in sitting with reaching outside BOS hand over hand for increased success secondary to possible double vision.  Attempted second round in standing, pt stood for ~30 seconds at a time before impulsively sitting and closing eyes. Completed in sitting.  Pt completed step up activity to further attend to left environment/LLE while challenging balance. 14x RLE leading and 6x LLE leading. Pt demonstrates decreased attention to left side of body requiring heavy multimodal cuing for leading with LLE. Frequent rest breaks during activity secondary to fatigue.   Pt ambulated ~45 ft HHA on IV pole MinA for safety with verbal/visual cuing for environment navigation and scanning to left.    Pt supine HOB elevated with waist restraint, RUE mit restraint, bed alarm on, and cotrack/IV batteries plugged back in. RN notified of non-forceful cortrak disconnection several  times in session secondary to missing piece. RN bedside at the end of session.   Therapy Documentation Precautions:  Precautions Precautions: Fall Precaution Comments: Cortrak, global aphasia, L below elbow amputation hx, L  inattention Restrictions Weight Bearing Restrictions: No  Pain: Pain Assessment Pain Scale: Faces Pain Score: 0-No pain   Therapy/Group: Individual Therapy  Pollie Poma, SPT  08/07/2020, 10:15 AM

## 2020-08-07 NOTE — Progress Notes (Signed)
PROGRESS NOTE   Subjective/Complaints:  Pt nodded head to no issues Denied pain   Review of systems limited by aphasia Objective:   DG Abd Portable 1V  Result Date: 08/06/2020 CLINICAL DATA:  Enteric catheter placement EXAM: PORTABLE ABDOMEN - 1 VIEW COMPARISON:  07/30/2020 FINDINGS: Semi-erect frontal view of the lower chest and upper abdomen demonstrates enteric catheter passing below diaphragm, weighted tip overlying the gastric body. Cardiac silhouette is unremarkable. Lungs are clear. IMPRESSION: 1. Enteric catheter tip projecting over gastric body. Electronically Signed   By: Randa Ngo M.D.   On: 08/06/2020 15:46   Recent Labs    08/05/20 1620 08/06/20 0530  WBC 10.4 8.9  HGB 10.1* 10.0*  HCT 33.0* 32.3*  PLT 448* 472*   Recent Labs    08/05/20 1620 08/06/20 0530  NA  --  141  K  --  4.4  CL  --  105  CO2  --  28  GLUCOSE  --  198*  BUN  --  32*  CREATININE 1.28* 1.18*  CALCIUM  --  9.3    Intake/Output Summary (Last 24 hours) at 08/07/2020 1821 Last data filed at 08/07/2020 1500 Gross per 24 hour  Intake 350.5 ml  Output --  Net 350.5 ml        Physical Exam: Vital Signs Blood pressure (!) 128/40, pulse 95, temperature 98.4 F (36.9 C), temperature source Oral, resp. rate 17, weight 94.5 kg, SpO2 96 %.   General: awake, alert, appropriate, waist restrains in place and RUE mitten; alone; NAD HENT: conjugate gaze; oropharynx moist; cortrak in place CV: regular rate; no JVD Pulmonary: CTA B/L; no W/R/R- good air movement GI: soft, NT, ND, (+)BS Psychiatric: flat affect Neurological: aphasic  Extremities: No clubbing, cyanosis, or edema Skin: No evidence of breakdown, no evidence of rash Neurologic: Cranial nerves II through XII intact, motor strength is 5/5 in right and 4/5 left deltoid, bicep, tricep, grip, hip flexor, knee extensors, ankle dorsiflexor and plantar  flexor  Musculoskeletal: Full range of motion in all 4 extremities, no pain with upper extremity or lower extremity range of motion.. No joint swelling Status post left hand amputation, chronic   Assessment/Plan: 1. Functional deficits which require 3+ hours per day of interdisciplinary therapy in a comprehensive inpatient rehab setting. Physiatrist is providing close team supervision and 24 hour management of active medical problems listed below. Physiatrist and rehab team continue to assess barriers to discharge/monitor patient progress toward functional and medical goals  Care Tool:  Bathing    Body parts bathed by patient: Right arm, Left arm, Chest, Abdomen, Front perineal area, Right upper leg, Left upper leg   Body parts bathed by helper: Buttocks, Right lower leg, Left lower leg     Bathing assist Assist Level: Minimal Assistance - Patient > 75%     Upper Body Dressing/Undressing Upper body dressing   What is the patient wearing?: Pull over shirt    Upper body assist Assist Level: Maximal Assistance - Patient 25 - 49%    Lower Body Dressing/Undressing Lower body dressing      What is the patient wearing?: Pants     Lower body assist  Assist for lower body dressing: Minimal Assistance - Patient > 75%     Toileting Toileting    Toileting assist Assist for toileting: Minimal Assistance - Patient > 75%     Transfers Chair/bed transfer  Transfers assist     Chair/bed transfer assist level: Contact Guard/Touching assist     Locomotion Ambulation   Ambulation assist      Assist level: Moderate Assistance - Patient 50 - 74% Assistive device: Hand held assist Max distance: 54ft   Walk 10 feet activity   Assist     Assist level: Moderate Assistance - Patient - 50 - 74% (+2 wc follow) Assistive device: Hand held assist   Walk 50 feet activity   Assist    Assist level: Moderate Assistance - Patient - 50 - 74% Assistive device: Hand held  assist    Walk 150 feet activity   Assist Walk 150 feet activity did not occur: Safety/medical concerns         Walk 10 feet on uneven surface  activity   Assist Walk 10 feet on uneven surfaces activity did not occur: Safety/medical concerns         Wheelchair     Assist Will patient use wheelchair at discharge?: No      Wheelchair assist level: Moderate Assistance - Patient 50 - 74% Max wheelchair distance: 32ft    Wheelchair 50 feet with 2 turns activity    Assist    Wheelchair 50 feet with 2 turns activity did not occur: Safety/medical concerns   Assist Level: Maximal Assistance - Patient 25 - 49% (maxA for two turns)   Wheelchair 150 feet activity     Assist  Wheelchair 150 feet activity did not occur: Safety/medical concerns       Blood pressure (!) 128/40, pulse 95, temperature 98.4 F (36.9 C), temperature source Oral, resp. rate 17, weight 94.5 kg, SpO2 96 %.   Medical Problem List and Plan: 1.  Side weakness with aphasia/dysphagia secondary to right MCA infarction secondary to right M2 occlusion status post mechanical thrombectomy             -patient may shower once Tfs aren't continuous             -ELOS/Goals: 2.5-3 weeks supervision to min A  -con't CIR- PT, OT, and SLP- restraints required since pulled Feeding tube out- getting OOB which is very unsafe.  2.  Antithrombotics: -DVT/anticoagulation: Subcutaneous heparin             -antiplatelet therapy: Aspirin 325 mg daily.  Consider to resume Eliquis 10 to 14 days given large infarct with hemorrhagic transformation as noted per neurology services. 3. Pain Management: Tylenol as needed 4. Mood: Provide emotional support             -antipsychotic agents: Seroquel 25 mg nightly 5. Neuropsych: This patient is not capable of making decisions on her own behalf. 6. Skin/Wound Care: Routine skin checks 7. Fluids/Electrolytes/Nutrition: Routine in and outs with follow-up chemistries 8.   Seizure prophylaxis.  EEG negative.  Keppra 500 mg twice daily. 9.  Dysphagia.  NPO.  Alternative means of nutritional support.  Dietary follow-up- con't Cortrak for now- if doesn't improve, might need PEG.  Poor oral movement, tongue protruding poor prognosis for p.o. feeds thus far Patient pulled feeding tube, this was reinserted on 08/06/2020, will need wrist restraints on the right side  7/16- pt requiring mitten on RUE and waist restraints- found standing OOB as well as pulled  Tube out for feeding- con't restraints.  10.  Atrial fibrillation.  Lopressor 25 mg twice daily.  Await plans to resume Eliquis 11.  Hypertension.  Isordil 60 mg twice daily, lisinopril 20 mg daily.  Monitor with increased mobility 12.  Diabetes mellitus.  Hemoglobin A1c 9.6.  NovoLog 2 units every 4 hours, Lantus insulin 20 units daily.  Check blood sugars before meals and at bedtime CBG (last 3)  Recent Labs    08/07/20 0434 08/07/20 1243 08/07/20 1619  GLUCAP 176* 196* 205*  Will increase Lantus to 25 units.  7/16- Bgs somewhat high- 170s-210- however just got Lantus increased- will wait 1-2 days before changing meds again.  13.  Hyperlipidemia.  Pravachol 14. Expressive aphasia- needs communication board. 15. Urinary incontinence- having since stroke- might benefit initially from [place] at NIGHT- explained working on toileting program during day.  16.  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 RUE skin tear- skin care being done- locally  LOS: 2 days A FACE TO FACE EVALUATION WAS PERFORMED  Jennifer Lucero 08/07/2020, 6:21 PM

## 2020-08-07 NOTE — Progress Notes (Signed)
Was notified by nurse tech pt had a skin tear on right forearm while she was changing pt.

## 2020-08-07 NOTE — Progress Notes (Signed)
Occupational Therapy Session Note  Patient Details  Name: Jennifer Lucero MRN: [id number] Date of Birth: 28-Dec-1950  Today's Date: 08/07/2020 OT Individual Time: 0937-1100 OT Individual Time Calculation (min): 83 min    Short Term Goals: Week 1:  OT Short Term Goal 1 (Week 1): Pt will don shirt with mod A using hemi-technique. OT Short Term Goal 2 (Week 1): Pt will don pants with min A. OT Short Term Goal 3 (Week 1): Pt will stand for > 5 min with CGA in prep for standing ADL. OT Short Term Goal 4 (Week 1): Pt will complete toilet transfer with CGA and LRAD.  Skilled Therapeutic Interventions/Progress Updates:    Session 1 [phone]) : Pt received semi-reclined in bed, appears agreeable to therapy. Session focus on self-care retraining, activity tolerance, command follow, L inattention, and functional cognition in prep for improved ADL/IADL/func mobility performance + decreased caregiver burden. Pt came sitting EOB close S. Donned pants with mod A to thread BLE and to pull up in back. CGA for STS. RN present to disconnect NG/IV. Donned new shirt with min A to thread LUE.  Stand-pivot no AD > w/c >  and later toilet CGA. Pt gesturing towards abdomen and grimacing, nodded yes if in pain and if wanted rx. Then nodded no when asked if need to use bathroom, but attempting to stand. Completed toilet transfer as noted above, mod A for LB clothing management, and close S for standing pericare poster cont void of bladder. Req A to rip off toilet paper. Washed hair at sink with hair washing tray - req max Vcs to keep head back and to hold tray strap tight. Pt noted to be falling asleep, but would open eyes on command. Seated at table, presented with simple block design and told to build design with blocks. Pt able to retrieve blocks placed on her L side and attempted to place on top of design card, but unable to to accurately complete design despite scaffolding from therapist. Presented simpler design and pt  able to loosely stack blocks. Amb transfer back to bed in room with CGA. Called husband at bedside, given permission to cut hair if needed 2/2 large matted knot in back.    Pt left with HOB at 44, RN present, soft lap belt and mitten donned with bed alarm engaged, call bell in reach, telesitter on, 4 bed rails up, and all immediate needs met.      Therapy Documentation Precautions:  Precautions Precautions: Fall Precaution Comments: Cortrak, global aphasia, L below elbow amputation hx, L inattention Restrictions Weight Bearing Restrictions: No  Pain: see session note   ADL: See Care Tool for more details.  Therapy/Group: Individual Therapy  [name] MS, OTR/L  08/07/2020, 6:43 AM

## 2020-08-08 DIAGNOSIS — I63511 Cerebral infarction due to unspecified occlusion or stenosis of right middle cerebral artery: Secondary | ICD-10-CM | POA: Diagnosis not present

## 2020-08-08 LAB — GLUCOSE, CAPILLARY
Glucose-Capillary: 154 mg/dL — ABNORMAL HIGH (ref 70–99)
Glucose-Capillary: 165 mg/dL — ABNORMAL HIGH (ref 70–99)
Glucose-Capillary: 169 mg/dL — ABNORMAL HIGH (ref 70–99)
Glucose-Capillary: 181 mg/dL — ABNORMAL HIGH (ref 70–99)
Glucose-Capillary: 186 mg/dL — ABNORMAL HIGH (ref 70–99)
Glucose-Capillary: 235 mg/dL — ABNORMAL HIGH (ref 70–99)

## 2020-08-08 MED ORDER — INSULIN GLARGINE 100 UNIT/ML ~~LOC~~ SOLN
22.0000 [IU] | Freq: Every day | SUBCUTANEOUS | Status: DC
  Administered 2020-08-09 – 2020-08-14 (×5): 22 [IU] via SUBCUTANEOUS
  Filled 2020-08-08 (×7): qty 0.22

## 2020-08-08 NOTE — Progress Notes (Addendum)
PROGRESS NOTE   Subjective/Complaints:  Pt  is raw with MASD on inner buttocks/rectum area and in vaginal area- Per staff, think creams- Gerhardt's cream etc will be helpful- don't think she needs Diflucan- area affected not large enough.  Pt reports/by nodding head she has no issues.     Review of systems limited by aphasia Objective:   DG Abd Portable 1V  Result Date: 08/06/2020 CLINICAL DATA:  Enteric catheter placement EXAM: PORTABLE ABDOMEN - 1 VIEW COMPARISON:  07/30/2020 FINDINGS: Semi-erect frontal view of the lower chest and upper abdomen demonstrates enteric catheter passing below diaphragm, weighted tip overlying the gastric body. Cardiac silhouette is unremarkable. Lungs are clear. IMPRESSION: 1. Enteric catheter tip projecting over gastric body. Electronically Signed   By: Randa Ngo M.D.   On: 08/06/2020 15:46   Recent Labs    08/05/20 1620 08/06/20 0530  WBC 10.4 8.9  HGB 10.1* 10.0*  HCT 33.0* 32.3*  PLT 448* 472*   Recent Labs    08/05/20 1620 08/06/20 0530  NA  --  141  K  --  4.4  CL  --  105  CO2  --  28  GLUCOSE  --  198*  BUN  --  32*  CREATININE 1.28* 1.18*  CALCIUM  --  9.3    Intake/Output Summary (Last 24 hours) at 08/08/2020 1330 Last data filed at 08/08/2020 0300 Gross per 24 hour  Intake 3718.19 ml  Output --  Net 3718.19 ml        Physical Exam: Vital Signs Blood pressure (!) 152/68, pulse 87, temperature 98.2 F (36.8 C), temperature source Oral, resp. rate 19, weight 94.5 kg, SpO2 94 %.    General: awake, alert, appropriate, laying on L side; Nurse in room and NT; NAD HENT: conjugate gaze; oropharynx dry- sticking tongue out- dry- needs oral care- hasn't been done yet (it's 8:30am); cortrak in place CV: regular rate; no JVD Pulmonary: CTA B/L; no W/R/R- good air movement GI: soft, NT, ND, (+)BS Psychiatric: flat; nonverbal- but shaking head yes and no  appropriately Neurological: aphasic/nonverbal- but cognition appears OK   Extremities: No clubbing, cyanosis, or edema Skin: Rough MASD in between buttocks- near rectum and in front on side of mons; R forearm small skin tear covered C/D/I and R triceps area bruise and dreid skin tear- small.  Neurologic: Cranial nerves II through XII intact, motor strength is 5/5 in right and 4/5 left deltoid, bicep, tricep, grip, hip flexor, knee extensors, ankle dorsiflexor and plantar flexor  Musculoskeletal: Full range of motion in all 4 extremities, no pain with upper extremity or lower extremity range of motion.. No joint swelling Status post left hand amputation, chronic   Assessment/Plan: 1. Functional deficits which require 3+ hours per day of interdisciplinary therapy in a comprehensive inpatient rehab setting. Physiatrist is providing close team supervision and 24 hour management of active medical problems listed below. Physiatrist and rehab team continue to assess barriers to discharge/monitor patient progress toward functional and medical goals  Care Tool:  Bathing    Body parts bathed by patient: Right arm, Left arm, Chest, Abdomen, Front perineal area, Right upper leg, Left upper leg  Body parts bathed by helper: Buttocks, Right lower leg, Left lower leg     Bathing assist Assist Level: Minimal Assistance - Patient > 75%     Upper Body Dressing/Undressing Upper body dressing   What is the patient wearing?: Pull over shirt    Upper body assist Assist Level: Maximal Assistance - Patient 25 - 49%    Lower Body Dressing/Undressing Lower body dressing      What is the patient wearing?: Pants     Lower body assist Assist for lower body dressing: Minimal Assistance - Patient > 75%     Toileting Toileting    Toileting assist Assist for toileting: Minimal Assistance - Patient > 75%     Transfers Chair/bed transfer  Transfers assist     Chair/bed transfer assist level:  Contact Guard/Touching assist     Locomotion Ambulation   Ambulation assist      Assist level: Moderate Assistance - Patient 50 - 74% Assistive device: Hand held assist Max distance: 26ft   Walk 10 feet activity   Assist     Assist level: Moderate Assistance - Patient - 50 - 74% (+2 wc follow) Assistive device: Hand held assist   Walk 50 feet activity   Assist    Assist level: Moderate Assistance - Patient - 50 - 74% Assistive device: Hand held assist    Walk 150 feet activity   Assist Walk 150 feet activity did not occur: Safety/medical concerns         Walk 10 feet on uneven surface  activity   Assist Walk 10 feet on uneven surfaces activity did not occur: Safety/medical concerns         Wheelchair     Assist Will patient use wheelchair at discharge?: No      Wheelchair assist level: Moderate Assistance - Patient 50 - 74% Max wheelchair distance: 89ft    Wheelchair 50 feet with 2 turns activity    Assist    Wheelchair 50 feet with 2 turns activity did not occur: Safety/medical concerns   Assist Level: Maximal Assistance - Patient 25 - 49% (maxA for two turns)   Wheelchair 150 feet activity     Assist  Wheelchair 150 feet activity did not occur: Safety/medical concerns       Blood pressure (!) 152/68, pulse 87, temperature 98.2 F (36.8 C), temperature source Oral, resp. rate 19, weight 94.5 kg, SpO2 94 %.   Medical Problem List and Plan: 1.  Side weakness with aphasia/dysphagia secondary to right MCA infarction secondary to right M2 occlusion status post mechanical thrombectomy             -patient may shower once Tfs aren't continuous             -ELOS/Goals: 2.5-3 weeks supervision to min A  -con't CIR- PT, OT, and SLP- restraints required since pulled Feeding tube out- getting OOB which is very unsafe.   -con't PT, OT and SLP- will stop waist restraint and use Posey and mitten if needed- and telesitter to stop  restraints.  2.  Antithrombotics: -DVT/anticoagulation: Subcutaneous heparin             -antiplatelet therapy: Aspirin 325 mg daily.  Consider to resume Eliquis 10 to 14 days given large infarct with hemorrhagic transformation as noted per neurology services. 3. Pain Management: Tylenol as needed 4. Mood: Provide emotional support             -antipsychotic agents: Seroquel 25 mg nightly  7/17-  sleeping OK per staff- con't regimen 5. Neuropsych: This patient is not capable of making decisions on her own behalf. 6. Skin/WounModerate d Care: Routine skin checks  7/17- MASD- will con't Gerhardt's and cream- and IV Diflucan since NPO 7. Fluids/Electrolytes/Nutrition: Routine in and outs with follow-up chemistries 8.  Seizure prophylaxis.  EEG negative.  Keppra 500 mg twice daily. 9.  Dysphagia.  NPO.  Alternative means of nutritional support.  Dietary follow-up- con't Cortrak for now- if doesn't improve, might need PEG.  Poor oral movement, tongue protruding poor prognosis for p.o. feeds thus far Patient pulled feeding tube, this was reinserted on 08/06/2020, will need wrist restraints on the right side  7/16- pt requiring mitten on RUE and waist restraints- found standing OOB as well as pulled Tube out for feeding- con't restraints.    7/17- posey and telesitter and mitten IF NEED be- stop overall restraints.  10.  Atrial fibrillation.  Lopressor 25 mg twice daily.  Await plans to resume Eliquis 11.  Hypertension.  Isordil 60 mg twice daily, lisinopril 20 mg daily.  Monitor with increased mobility 12.  Diabetes mellitus.  Hemoglobin A1c 9.6.  NovoLog 2 units every 4 hours, Lantus insulin 20 units daily.  Check blood sugars before meals and at bedtime CBG (last 3)  Recent Labs    08/08/20 0443 08/08/20 0829 08/08/20 1111  GLUCAP 181* 169* 235*  Will increase Lantus to 25 units.  7/16- Bgs somewhat high- 170s-210- however just got Lantus increased- will wait 1-2 days before changing meds  again.   7/17- BG's look about the same- will increase Lantus to 27 units= however still at 20 units- will increase  to 22 units 13.  Hyperlipidemia.  Pravachol 14. Expressive aphasia- needs communication board. 15. Urinary incontinence- having since stroke- might benefit initially from [place] at NIGHT- explained working on toileting program during day.  16.  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 RUE skin tear- skin care being done- locally  7/17- doing well- Also has new MASD- con't local wound care and also on IV Diflucan.   LOS: 3 days A FACE TO FACE EVALUATION WAS PERFORMED  Raymona Boss 08/08/2020, 1:30 PM

## 2020-08-09 DIAGNOSIS — I63511 Cerebral infarction due to unspecified occlusion or stenosis of right middle cerebral artery: Secondary | ICD-10-CM | POA: Diagnosis not present

## 2020-08-09 LAB — BASIC METABOLIC PANEL
Anion gap: 8 (ref 5–15)
BUN: 30 mg/dL — ABNORMAL HIGH (ref 8–23)
CO2: 26 mmol/L (ref 22–32)
Calcium: 9.5 mg/dL (ref 8.9–10.3)
Chloride: 106 mmol/L (ref 98–111)
Creatinine, Ser: 1.05 mg/dL — ABNORMAL HIGH (ref 0.44–1.00)
GFR, Estimated: 56 mL/min — ABNORMAL LOW (ref >60)
Glucose, Bld: 232 mg/dL — ABNORMAL HIGH (ref 70–99)
Potassium: 4.2 mmol/L (ref 3.5–5.1)
Sodium: 140 mmol/L (ref 135–145)

## 2020-08-09 LAB — CBC WITH DIFFERENTIAL/PLATELET
Abs Immature Granulocytes: 0.15 10*3/uL — ABNORMAL HIGH (ref 0.00–0.07)
Basophils Absolute: 0 10*3/uL (ref 0.0–0.1)
Basophils Relative: 1 %
Eosinophils Absolute: 0.4 10*3/uL (ref 0.0–0.5)
Eosinophils Relative: 5 %
HCT: 31.7 % — ABNORMAL LOW (ref 36.0–46.0)
Hemoglobin: 10 g/dL — ABNORMAL LOW (ref 12.0–15.0)
Immature Granulocytes: 2 %
Lymphocytes Relative: 33 %
Lymphs Abs: 2.8 10*3/uL (ref 0.7–4.0)
MCH: 29.2 pg (ref 26.0–34.0)
MCHC: 31.5 g/dL (ref 30.0–36.0)
MCV: 92.4 fL (ref 80.0–100.0)
Monocytes Absolute: 0.9 10*3/uL (ref 0.1–1.0)
Monocytes Relative: 11 %
Neutro Abs: 4.2 10*3/uL (ref 1.7–7.7)
Neutrophils Relative %: 48 %
Platelets: 443 10*3/uL — ABNORMAL HIGH (ref 150–400)
RBC: 3.43 MIL/uL — ABNORMAL LOW (ref 3.87–5.11)
RDW: 15 % (ref 11.5–15.5)
WBC: 8.4 10*3/uL (ref 4.0–10.5)
nRBC: 0 % (ref 0.0–0.2)

## 2020-08-09 LAB — GLUCOSE, CAPILLARY
Glucose-Capillary: 135 mg/dL — ABNORMAL HIGH (ref 70–99)
Glucose-Capillary: 137 mg/dL — ABNORMAL HIGH (ref 70–99)
Glucose-Capillary: 154 mg/dL — ABNORMAL HIGH (ref 70–99)
Glucose-Capillary: 155 mg/dL — ABNORMAL HIGH (ref 70–99)
Glucose-Capillary: 162 mg/dL — ABNORMAL HIGH (ref 70–99)
Glucose-Capillary: 279 mg/dL — ABNORMAL HIGH (ref 70–99)

## 2020-08-09 MED ORDER — INSULIN ASPART 100 UNIT/ML IJ SOLN
3.0000 [IU] | INTRAMUSCULAR | Status: DC
  Administered 2020-08-09 – 2020-08-15 (×26): 3 [IU] via SUBCUTANEOUS

## 2020-08-09 MED ORDER — SODIUM CHLORIDE 0.9 % IV SOLN
INTRAVENOUS | Status: DC | PRN
  Administered 2020-08-09 – 2020-08-15 (×5): 250 mL via INTRAVENOUS

## 2020-08-09 MED ORDER — CALCIUM CARBONATE ANTACID 500 MG PO CHEW: 1.0000 | CHEWABLE_TABLET | Freq: Four times a day (QID) | ORAL | Status: DC | PRN

## 2020-08-09 NOTE — Progress Notes (Signed)
PROGRESS NOTE   Subjective/Complaints: Discussed skin area with pt an dRN Per RN skin areas looked worse a "few days ago"  Review of systems limited by aphasia Objective:   No results found. Recent Labs    08/09/20 0837  WBC 8.4  HGB 10.0*  HCT 31.7*  PLT 443*    Recent Labs    08/09/20 0837  NA 140  K 4.2  CL 106  CO2 26  GLUCOSE 232*  BUN 30*  CREATININE 1.05*  CALCIUM 9.5     Intake/Output Summary (Last 24 hours) at 08/09/2020 1133 Last data filed at 08/08/2020 1842 Gross per 24 hour  Intake --  Output 500 ml  Net -500 ml         Physical Exam: Vital Signs Blood pressure 131/63, pulse 81, temperature 98 F (36.7 C), temperature source Oral, resp. rate 18, weight 94.5 kg, SpO2 95 %.  General: No acute distress Mood and affect are appropriate Heart: Regular rate and rhythm no rubs murmurs or extra sounds Lungs: Clear to auscultation, breathing unlabored, no rales or wheezes Abdomen: Positive bowel sounds, soft nontender to palpation, nondistended Extremities: No clubbing, cyanosis, or edema Skin: No MASD on [place] or inguinal crease or under breast  Neurologic: Cranial nerves II through XII intact, motor strength is 5/5 in right and 4/5 left deltoid, bicep, tricep, grip, hip flexor, knee extensors, ankle dorsiflexor and plantar flexor  Musculoskeletal: Full range of motion in all 4 extremities, no pain with upper extremity or lower extremity range of motion.. No joint swelling Status post left hand amputation, chronic   Assessment/Plan: 1. Functional deficits which require 3+ hours per day of interdisciplinary therapy in a comprehensive inpatient rehab setting. Physiatrist is providing close team supervision and 24 hour management of active medical problems listed below. Physiatrist and rehab team continue to assess barriers to discharge/monitor patient progress toward functional and medical  goals  Care Tool:  Bathing    Body parts bathed by patient: Right arm, Left arm, Chest, Abdomen, Front perineal area, Right upper leg, Left upper leg   Body parts bathed by helper: Buttocks, Right lower leg, Left lower leg     Bathing assist Assist Level: Minimal Assistance - Patient > 75%     Upper Body Dressing/Undressing Upper body dressing   What is the patient wearing?: Pull over shirt    Upper body assist Assist Level: Maximal Assistance - Patient 25 - 49%    Lower Body Dressing/Undressing Lower body dressing      What is the patient wearing?: Pants     Lower body assist Assist for lower body dressing: Minimal Assistance - Patient > 75%     Toileting Toileting    Toileting assist Assist for toileting: Minimal Assistance - Patient > 75%     Transfers Chair/bed transfer  Transfers assist     Chair/bed transfer assist level: Contact Guard/Touching assist     Locomotion Ambulation   Ambulation assist      Assist level: Moderate Assistance - Patient 50 - 74% Assistive device: Hand held assist Max distance: 27ft   Walk 10 feet activity   Assist     Assist level: Moderate  Assistance - Patient - 50 - 74% (+2 wc follow) Assistive device: Hand held assist   Walk 50 feet activity   Assist    Assist level: Moderate Assistance - Patient - 50 - 74% Assistive device: Hand held assist    Walk 150 feet activity   Assist Walk 150 feet activity did not occur: Safety/medical concerns         Walk 10 feet on uneven surface  activity   Assist Walk 10 feet on uneven surfaces activity did not occur: Safety/medical concerns         Wheelchair     Assist Will patient use wheelchair at discharge?: No      Wheelchair assist level: Moderate Assistance - Patient 50 - 74% Max wheelchair distance: 40ft    Wheelchair 50 feet with 2 turns activity    Assist    Wheelchair 50 feet with 2 turns activity did not occur: Safety/medical  concerns   Assist Level: Maximal Assistance - Patient 25 - 49% (maxA for two turns)   Wheelchair 150 feet activity     Assist  Wheelchair 150 feet activity did not occur: Safety/medical concerns       Blood pressure 131/63, pulse 81, temperature 98 F (36.7 C), temperature source Oral, resp. rate 18, weight 94.5 kg, SpO2 95 %.   Medical Problem List and Plan: 1.  Side weakness with aphasia/dysphagia secondary to right MCA infarction secondary to right M2 occlusion status post mechanical thrombectomy             -patient may shower once Tfs aren't continuous             -ELOS/Goals: 2.5-3 weeks supervision to min A  -con't CIR- PT, OT, and SLP- restraints required since pulled Feeding tube out- getting OOB which is very unsafe.   -con't PT, OT and SLP- will stop waist restraint and use Posey and mitten if needed- and telesitter to stop restraints.  2.  Antithrombotics: -DVT/anticoagulation: Subcutaneous heparin             -antiplatelet therapy: Aspirin 325 mg daily.  Consider to resume Eliquis 10 to 14 days given large infarct with hemorrhagic transformation as noted per neurology services. 3. Pain Management: Tylenol as needed 4. Mood: Provide emotional support             -antipsychotic agents: Seroquel 25 mg nightly  7/17- sleeping OK per staff- con't regimen 5. Neuropsych: This patient is not capable of making decisions on her own behalf. 6. Skin/WounModerate d Care: Routine skin checks  7/17- MASD- will con't Gerhardt's and cream- received 3 doses of  IV Diflucan with much less redness, will d/c 7. Fluids/Electrolytes/Nutrition: Routine in and outs with follow-up chemistries 8.  Seizure prophylaxis.  EEG negative.  Keppra 500 mg twice daily. 9.  Dysphagia.  NPO.  Alternative means of nutritional support.  Dietary follow-up- con't Cortrak for now- if doesn't improve, might need PEG.  needs R mitten  as well as soft waist restraint 10.  Atrial fibrillation.  Lopressor 25 mg  twice daily.  Await plans to resume Eliquis 11.  Hypertension.  Isordil 60 mg twice daily, lisinopril 20 mg daily.  Monitor with increased mobility 12.  Diabetes mellitus.  Hemoglobin A1c 9.6.  NovoLog 2 units every 4 hours, Lantus insulin 20 units daily.  Check blood sugars before meals and at bedtime CBG (last 3)  Recent Labs    08/09/20 0006 08/09/20 0419 08/09/20 1117  GLUCAP 162* 154* 279*  Am CBG ok but elevated at 1117a, increae novalog to 3U 13.  Hyperlipidemia.  Pravachol 14. Expressive aphasia- needs communication board. 15. Urinary incontinence- having since stroke- might benefit initially from [place] at NIGHT- explained working on toileting program during day.  16.  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 RUE skin tear- skin care being done- locally    LOS: 4 days A FACE TO FACE EVALUATION WAS PERFORMED  Charlett Blake 08/09/2020, 11:33 AM

## 2020-08-09 NOTE — Progress Notes (Addendum)
Physical Therapy Session Note  Patient Details  Name: Jennifer Lucero MRN: [id number] Date of Birth: 1950/06/01  Today's Date: 08/09/2020 PT Individual Time: 1601-0932 and  PT Individual Time Calculation (min): 32 min   Short Term Goals: Week 1:  PT Short Term Goal 1 (Week 1): Pt will consistently complete rolling ModA PT Short Term Goal 2 (Week 1): Pt will consistently complete supine <> sit ModA PT Short Term Goal 3 (Week 1): Pt will ambulate 11ft MinA PT Short Term Goal 4 (Week 1): Pt will improve attention towards Left environment requiring cuing for attention <75% of the time PT Short Term Goal 5 (Week 1): Pt will complete stairs with MinA  Skilled Therapeutic Interventions/Progress Updates:  Session 1: Patient seated upright in w/c on entrance to room. Patient alert and agreeable to PT session. Indicates that she needs to toilet. Patient with no indication of pain throughout session. Pt with tube feeding active through session. Time required to setup toileting as pt is impulsive in all functional mobility.   Therapeutic Activity: Transfers: Patient performed STS and SPVT transfers with CGA and no AD. Toilet transfer with CGA/ Min A mainly for correct sequencing as pt attempts to sit to toilet prior to doffing brief. Requires Max A to doff brief where pt has had small incident of bowel incontinence. Pt attempting to impulsively doff on her own requiring vc/ tc to focus on balance and allow this therapist to remove brief. Pt demos motor perseveration with pull of toilet tissue from holder, pulling large wads of tissue for pericare. Provided vc/ tc for appropriate amounts of tissue, placement of dirty toilet tissue. Max A to complete pericare, Mod A to pull up brief.   Gait Training:  Patient ambulated short household distances to/ from bathroom with no AD and to/ from sink requiring CGA for balance throughout.  Demonstrated decreased bil knee flexion and increased lateral sway/ weight  shift to advance contralateral LE. Provided vc/ tc for balance and slowing pace.  Patient seated in w/c at end of session with brakes locked, belt alarm set, and all needs within reach on tray table placed in front of and to R of pt.    Session 2: Patient seated in w/c on entrance to room. Patient alert and agreeable to PT session. Cortrak feeding completed and requested RN to disconnect for improved ease of transport and ambulation in therapy. Patient with no pain complaint at start of session, but with increased time in standing, pt starts to indicate pain in lower back. Addressed with chang in position and related to RN with possible need for medication. Pt provided with larger size w/c and leg rests for improved fit in width and lower height.  Therapeutic Activity: Bed Mobility: Patient performed sit--> supine with Mod A for BLE. Unable to follow verbal instructions for rolling into supine positioning until therapist positioned self to opposite side of bed and pt rolling to back to be able to look at therapist. Mod A required for neutral positioning once in bed with vc. VC/ tc required throughout for sequencing and technique. Transfers: Patient performed STS and SPVT transfers throughout session with CGA. Provided vc for improved technique and safety.  Gait Training:  Patient ambulated 100' x1/ 42' x1 with CGA/ intermittent Min A for balance and no AD. Demonstrated decreased knee flexion bilaterally during swing through. Provided vc/ tc for correcting lateral sway and attempts to increase knee flexion.  Neuromuscular Re-ed: NMR facilitated during session with focus on sitting and  standing balance as well as following detailed instructions. Attempted use of BITS for sequence search of alphabet and then numbers with pt unable to locate "A" or "1" to start either sequence pattern even with verbal cue for exact letter/ number. Initiated with standing on Airex pad and requiring heavy CGA to maintain  position on Airex with no foot movement. Attempted in seated position with no change in cognition/ awareness of correct sequencing  Pt then guided in standing and reaching activity. Horse shoes placed  to pt's R side and target placed on tray table in front and to L of pt and outside of BOS. Pt instructed to take horseshoes one at a time and place around target. Pt attempts to throw at target initially and then tries to grab more than one horseshoe at a time, requiring vc for following correct instructions. Pt performed same in return of horseshoes to bin at her R side. No LOB noted good reach and hip hinge noted for increasing reach.  NMR performed for improvements in motor control and coordination, balance, sequencing, judgement, and self confidence/ efficacy in performing all aspects of mobility at highest level of independence.   Patient supine  in bed at end of session with brakes locked, bed alarm set, bed belt alarm set, and all needs within reach. Husband in room. Husband made aware that pt will need regular clothes appropriate for therapy. Rubber soled shoes with laces will allow pt to have therapy sessions outside as weather allows.    Therapy Documentation Precautions:  Precautions Precautions: Fall Precaution Comments: Cortrak, global aphasia, L below elbow amputation hx, L inattention Restrictions Weight Bearing Restrictions: No  Therapy/Group: Individual Therapy  Alger Simons PT, DPT  08/09/2020, 4:26 PM

## 2020-08-09 NOTE — Progress Notes (Signed)
Occupational Therapy Session Note  Patient Details  Name: Jennifer Lucero MRN: [id number] Date of Birth: 09-03-1950  Today's Date: 08/09/2020 OT Individual Time: 7893-8101 OT Individual Time Calculation (min): 59 min    Short Term Goals: Week 1:  OT Short Term Goal 1 (Week 1): Pt will don shirt with mod A using hemi-technique. OT Short Term Goal 2 (Week 1): Pt will don pants with min A. OT Short Term Goal 3 (Week 1): Pt will stand for > 5 min with CGA in prep for standing ADL. OT Short Term Goal 4 (Week 1): Pt will complete toilet transfer with CGA and LRAD.  Skilled Therapeutic Interventions/Progress Updates:  Pt received supine in bed with RN present, pt smiling and nodding when asking if pt agreeable to OT intervention. Pt currently requires MIN A for bed mobility needing light assist to elevate trunk into sitting. Pt requires CGA for stand pivot transfer to w/c, pt gesturing to BR, pt able to ambulate into BR ~ 10 ft with HHA, MOD A for toileting to manage brief and set- up wash cloths and perform pericare. Pt able to ambulate back to w/c in same fashion as previously indicated. Pt transported to sink where pt able to wash face with set- up assist, MOD A for LB dressing to don scrubs pants. Pt transported to day room with total A.  Pt instructed on cognitive task where pt instructed to place correct number of coins on numbered circles. Pt initially completing task correctly with pt able to place 5 coins on the number "5" but then noted to mix up numbers such as putting 7 coins on the number 1, when oriented to pts mistakes pt self correcting inconsistently, as pt initially able to correct her mistakes but then noted to prefer to place random number of coins on numbered circles. Pt able to complete this task in standing with CGA. Pt appropriate throughout session, gesturing and nodding appropriately. Pt transported back to room where pt was left up in w/c with all needs within reach and safety  alarm activated.    Therapy Documentation Precautions:  Precautions Precautions: Fall Precaution Comments: Cortrak, global aphasia, L below elbow amputation hx, L inattention Restrictions Weight Bearing Restrictions: No General:   Vital Signs:   Pain: Pt reports no pain during session.    Therapy/Group: Individual Therapy  Corinne Ports [facility] 08/09/2020, 12:15 PM

## 2020-08-09 NOTE — Progress Notes (Addendum)
Speech Language Pathology Daily Session Note  Patient Details  Name: Jennifer Lucero MRN: [id number] Date of Birth: 1950-12-03  Today's Date: 08/09/2020 SLP Individual Time: 8242-3536 SLP Individual Time Calculation (min): 45 min and Today's Date: 08/09/2020 SLP Missed Time: 15 Minutes Missed Time Reason: Other (Comment) (Therapist delayed from previous session)  Short Term Goals: Week 1: SLP Short Term Goal 1 (Week 1): Patient will consume ice chips trials with minimal overt s/sx of aspiration to indicate readiness to participate in instrumental swallow assessment (MBS). SLP Short Term Goal 2 (Week 1): Patient will use gestures/pointing to communicate basic wants and needs with mod A verbal/visual cues SLP Short Term Goal 3 (Week 1): Patient will identify objects within a visual field of 3 with 50% accuracy and max A verbal/visual cues SLP Short Term Goal 4 (Week 1): Patient will exhibit meaningful vocalization during structured speech/language tasks (e.g., automatic speech) with max verbal/visual/tactile cues. SLP Short Term Goal 5 (Week 1): Following a visual model, patient will produce 10 repetitions of the oral motor movement with max verbal/visual/tactile cues.  Skilled Therapeutic Interventions: Patient agreeable to skilled ST intervention with focus on dysphagia and communication. Jennifer Lucero colored coating observed on tongue which was approximately 90% cleared during oral care. Suspect accumulation of oral secretions. Patient completed oral care with mod A. Patient displayed protruded tongue and open mouth posture at rest. Facilitated oral motor movements with max verbal/visual/tactile cues involving lingual ROM (lateralization, elevation, protrude/retract), and opening/closing mouth. Patient was able to protrude/retract tongue x5 with great effort. Lateralize tongue toward right x1, unable during all other attempts. Unable to elevate tongue toward upper lip (had tendency to elevate head vs.  tongue), and unable to open/close mouth volitionally. Attempted to initiate PO trials of ice chips. Patient was unable to receive ice chip from spoon secondary to oral motor impairments thus SLP placed small single ice chip on tongue. Patient was unable to orally manipulate ice chip and it eventually fell out of her oral cavity. She attempted to throw her head back in effort to transfer bolus posteriorly however she was discouraged from doing this with mod A verbal cues due to increased aspiration risk. Any residuals from ice chip were suctioned. Patient unable to swallow volitionally. Facilitated automatic speech singing (happy birthday) in which patient elicited intentional vocalizations with intoning during 50% of occasions during 3/3 attempts with max A verbal/visual cues. Additional language tasks were unable to be performed secondary to report of heartburn/nausea. Patient was able to communicate this through use of gestures and verified additional information through answering Y/N questions with head nods. Nurse was notified. Patient was left in wheelchair with alarm activated and soft mit in place on right hand (no mit for left 2/2 hand amputation) at end of session. Continue per current plan of care.      Pain Pain Assessment Pain Scale: 0-10 Pain Score: 0-No pain Faces Pain Scale: No hurt  Therapy/Group: Individual Therapy  Jennifer Lucero, M.S., CCC-SLP   Jennifer Lucero Jennifer Lucero 08/09/2020, 11:14 AM

## 2020-08-10 ENCOUNTER — Other Ambulatory Visit: Payer: Self-pay

## 2020-08-10 ENCOUNTER — Encounter (HOSPITAL_COMMUNITY): Payer: Self-pay | Admitting: Physical Medicine & Rehabilitation

## 2020-08-10 DIAGNOSIS — I63511 Cerebral infarction due to unspecified occlusion or stenosis of right middle cerebral artery: Secondary | ICD-10-CM | POA: Diagnosis not present

## 2020-08-10 LAB — GLUCOSE, CAPILLARY
Glucose-Capillary: 158 mg/dL — ABNORMAL HIGH (ref 70–99)
Glucose-Capillary: 202 mg/dL — ABNORMAL HIGH (ref 70–99)
Glucose-Capillary: 162 mg/dL — ABNORMAL HIGH (ref 70–99)
Glucose-Capillary: 178 mg/dL — ABNORMAL HIGH (ref 70–99)
Glucose-Capillary: 162 mg/dL — ABNORMAL HIGH (ref 70–99)

## 2020-08-10 NOTE — Progress Notes (Signed)
Occupational Therapy Session Note  Patient Details  Name: Jennifer Lucero MRN: [id number] Date of Birth: [birth year]  Today's Date: 08/10/2020 OT Individual Time: 1450-1535 OT Individual Time Calculation (min): 45 min    Short Term Goals: Week 1:  OT Short Term Goal 1 (Week 1): Pt will don shirt with mod A using hemi-technique. OT Short Term Goal 2 (Week 1): Pt will don pants with min A. OT Short Term Goal 3 (Week 1): Pt will stand for > 5 min with CGA in prep for standing ADL. OT Short Term Goal 4 (Week 1): Pt will complete toilet transfer with CGA and LRAD.  Skilled Therapeutic Interventions/Progress Updates:  Pt received supine in bed with pt gesturing to stomach when asking pt about pain, assisted pt to EOB with CGA with pt able to complete ambulatory transfer to BR with MIN A with pt holding onto IV pole with RUE. Pt required MOD A for toileting for clothing mgmt as pt required clean brief. Pt unable to void during toileting trial. Remainder of session to focus on cognitive therapeutic activities with pt needing MAX cues to sequence matching number of coins to numbers places on tray table, pt initially able to nod correctly when providing 2 choices about which number pt was working on but then pts answers began to be inconsistent. Attempted several other matching activities such as matching colors to the words with little carryover. Attempted to have pt copy OTAs handwriting with pt only scribbling with pen. Pt able to complete oral care with set- up assist, pt returned to supine with CGA. Pt left in supine with posey bed belt alarm activated and all needs within reach.   Therapy Documentation Precautions:  Precautions Precautions: Fall Precaution Comments: Cortrak, global aphasia, L below elbow amputation hx, L inattention Restrictions Weight Bearing Restrictions: No Pain: Pt gesturing to stomach when asking about pain, repositioned pt in bed and assisted pt to BR as pain mgmt  strategy.     Therapy/Group: Individual Therapy  Precious Haws 08/10/2020, 4:19 PM

## 2020-08-10 NOTE — Progress Notes (Signed)
PROGRESS NOTE   Subjective/Complaints: 5 loose stools in last 24 h Speech working with pt , pt not able to do oral closure around spoon  Review of systems limited by aphasia Objective:   No results found. Recent Labs    08/09/20 0837  WBC 8.4  HGB 10.0*  HCT 31.7*  PLT 443*    Recent Labs    08/09/20 0837  NA 140  K 4.2  CL 106  CO2 26  GLUCOSE 232*  BUN 30*  CREATININE 1.05*  CALCIUM 9.5     Intake/Output Summary (Last 24 hours) at 08/10/2020 0859 Last data filed at 08/10/2020 0300 Gross per 24 hour  Intake 3402.24 ml  Output --  Net 3402.24 ml         Physical Exam: Vital Signs Blood pressure (!) 128/58, pulse 69, temperature (!) 97.5 F (36.4 C), temperature source Oral, resp. rate 18, weight 94.5 kg, SpO2 96 %.   General: No acute distress Mood and affect are appropriate Heart: Regular rate and rhythm no rubs murmurs or extra sounds Lungs: Clear to auscultation, breathing unlabored, no rales or wheezes Abdomen: Positive bowel sounds, soft nontender to palpation, nondistended Extremities: No clubbing, cyanosis, or edema Skin: No evidence of breakdown, no evidence of rash  Neurologic: Cranial nerves II through XII intact, motor strength is 5/5 in right and 4/5 left deltoid, bicep, tricep, grip, hip flexor, knee extensors, ankle dorsiflexor and plantar flexor  Musculoskeletal: Full range of motion in all 4 extremities, no pain with upper extremity or lower extremity range of motion.. No joint swelling Status post left hand amputation, chronic   Assessment/Plan: 1. Functional deficits which require 3+ hours per day of interdisciplinary therapy in a comprehensive inpatient rehab setting. Physiatrist is providing close team supervision and 24 hour management of active medical problems listed below. Physiatrist and rehab team continue to assess barriers to discharge/monitor patient progress toward  functional and medical goals  Care Tool:  Bathing    Body parts bathed by patient: Right arm, Left arm, Chest, Abdomen, Front perineal area, Right upper leg, Left upper leg   Body parts bathed by helper: Buttocks, Right lower leg, Left lower leg     Bathing assist Assist Level: Minimal Assistance - Patient > 75%     Upper Body Dressing/Undressing Upper body dressing   What is the patient wearing?: Pull over shirt    Upper body assist Assist Level: Maximal Assistance - Patient 25 - 49%    Lower Body Dressing/Undressing Lower body dressing      What is the patient wearing?: Pants, Underwear/pull up     Lower body assist Assist for lower body dressing: Moderate Assistance - Patient 50 - 74%     Toileting Toileting    Toileting assist Assist for toileting: Moderate Assistance - Patient 50 - 74%     Transfers Chair/bed transfer  Transfers assist     Chair/bed transfer assist level: Contact Guard/Touching assist     Locomotion Ambulation   Ambulation assist      Assist level: Moderate Assistance - Patient 50 - 74% Assistive device: Hand held assist Max distance: 51ft   Walk 10 feet activity  Assist     Assist level: Moderate Assistance - Patient - 50 - 74% (+2 wc follow) Assistive device: Hand held assist   Walk 50 feet activity   Assist    Assist level: Moderate Assistance - Patient - 50 - 74% Assistive device: Hand held assist    Walk 150 feet activity   Assist Walk 150 feet activity did not occur: Safety/medical concerns         Walk 10 feet on uneven surface  activity   Assist Walk 10 feet on uneven surfaces activity did not occur: Safety/medical concerns         Wheelchair     Assist Will patient use wheelchair at discharge?: No      Wheelchair assist level: Moderate Assistance - Patient 50 - 74% Max wheelchair distance: 64ft    Wheelchair 50 feet with 2 turns activity    Assist    Wheelchair 50 feet  with 2 turns activity did not occur: Safety/medical concerns   Assist Level: Maximal Assistance - Patient 25 - 49% (maxA for two turns)   Wheelchair 150 feet activity     Assist  Wheelchair 150 feet activity did not occur: Safety/medical concerns       Blood pressure (!) 128/58, pulse 69, temperature (!) 97.5 F (36.4 C), temperature source Oral, resp. rate 18, weight 94.5 kg, SpO2 96 %.   Medical Problem List and Plan: 1.  Side weakness with aphasia/dysphagia secondary to right MCA infarction secondary to right M2 occlusion status post mechanical thrombectomy             -patient may shower once Tfs aren't continuous             -ELOS/Goals: 2.5-3 weeks supervision to min A- team conf in am   -con't CIR- PT, OT, and SLP- restraints required since pulled Feeding tube out- getting OOB which is very unsafe.   -con't PT, OT and SLP- will stop waist restraint and use Posey and mitten if needed- and telesitter to stop restraints.  2.  Antithrombotics: -DVT/anticoagulation: Subcutaneous heparin             -antiplatelet therapy: Aspirin 325 mg daily.  Consider to resume Eliquis 10 to 14 days given large infarct with hemorrhagic transformation as noted per neurology services. 3. Pain Management: Tylenol as needed 4. Mood: Provide emotional support             -antipsychotic agents: Seroquel 25 mg nightly  7/17- sleeping OK per staff- con't regimen 5. Neuropsych: This patient is not capable of making decisions on her own behalf. 6. Skin/WounModerate d Care: Routine skin checks  7/17- MASD- will con't Gerhardt's and cream- received 3 doses of  IV Diflucan with much less redness, will d/c 7. Fluids/Electrolytes/Nutrition: Routine in and outs with follow-up chemistries 8.  Seizure prophylaxis.  EEG negative.  Keppra 500 mg twice daily. 9.  Dysphagia.  NPO.  Alternative means of nutritional support.  Dietary follow-up- con't Cortrak for now- if doesn't improve, might need PEG.  needs R  mitten  as well as soft waist restraint 10.  Atrial fibrillation.  Lopressor 25 mg twice daily.  Await plans to resume Eliquis 11.  Hypertension.  Isordil 60 mg twice daily, lisinopril 20 mg daily.  Monitor with increased mobility 12.  Diabetes mellitus.  Hemoglobin A1c 9.6.  NovoLog 2 units every 4 hours, Lantus insulin 20 units daily.  Check blood sugars before meals and at bedtime CBG (last 3)  Recent Labs  08/09/20 2353 08/10/20 0421 08/10/20 0756  GLUCAP 137* 158* 202*   Am CBG ok but elevated at 1117a, increae novalog to 3U 13.  Hyperlipidemia.  Pravachol 14. Expressive aphasia- needs communication board. 15. Urinary incontinence- having since stroke- might benefit initially from [place] at NIGHT- explained working on toileting program during day.  16.  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 RUE skin tear- skin care being done- locally 18.  Loose incont stools - abd exam unremarkable , may be TF related +/- meds (pravastatin or Diflucan) will stop diflucan as cutaneous yeast infx cleared   Check stool for C diff if no improvement with stopping diflucan  LOS: 5 days A FACE TO FACE EVALUATION WAS PERFORMED  Charlett Blake 08/10/2020, 8:59 AM

## 2020-08-10 NOTE — Progress Notes (Signed)
Speech Language Pathology Daily Session Note  Patient Details  Name: Jennifer Lucero MRN: [id number] Date of Birth: 12-May-1950  Today's Date: 08/10/2020 SLP Individual Time: 0800-0900 SLP Individual Time Calculation (min): 60 min  Short Term Goals: Week 1: SLP Short Term Goal 1 (Week 1): Patient will consume ice chips trials with minimal overt s/sx of aspiration to indicate readiness to participate in instrumental swallow assessment (MBS). SLP Short Term Goal 2 (Week 1): Patient will use gestures/pointing to communicate basic wants and needs with mod A verbal/visual cues SLP Short Term Goal 3 (Week 1): Patient will identify objects within a visual field of 3 with 50% accuracy and max A verbal/visual cues SLP Short Term Goal 4 (Week 1): Patient will exhibit meaningful vocalization during structured speech/language tasks (e.g., automatic speech) with max verbal/visual/tactile cues. SLP Short Term Goal 5 (Week 1): Following a visual model, patient will produce 10 repetitions of the oral motor movement with max verbal/visual/tactile cues.  Skilled Therapeutic Interventions: Patient agreeable to skilled ST intervention with focus on swallowing and communication. Patient communicated to therapist that she needed to go to the bathroom via pointing to her stomach/brief and bathroom. Assisted patient to bathroom where she exhibited loose stool, requiring min A verbal/visual cues for following 1-2 step directions. Benefited from gestures for visual support to enhance comprehension. Patient was dependent for oral care and exhibited oral defensiveness to suction sponge. May attribute to taste of oral rinse. Patient agreeable to oral gel on tongue and lips. 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 Patient known to vocalize on occasion and communicated through gestures and through answering yes/no questions  during approximately 50% of occasions. No response given with all other communication attempts. Patient was left in bed with alarm activated, alarm belt and soft mit in place, and immediate needs within reach at end of session. Continue per current plan of care.      Pain Pain Assessment Pain Scale: 0-10 Pain Score: 0-No pain  Therapy/Group: Individual Therapy  Patty Sermons 08/10/2020, 8:56 AM

## 2020-08-10 NOTE — Progress Notes (Signed)
Physical Therapy Session Note  Patient Details  Name: Jennifer Lucero MRN: [id number] Date of Birth: 1950/07/08  Today's Date: 08/10/2020 PT Individual Time: 0900-1000 PT Individual Time Calculation (min): 60 min   Short Term Goals: Week 1:  PT Short Term Goal 1 (Week 1): Pt will consistently complete rolling ModA PT Short Term Goal 2 (Week 1): Pt will consistently complete supine <> sit ModA PT Short Term Goal 3 (Week 1): Pt will ambulate 161ft MinA PT Short Term Goal 4 (Week 1): Pt will improve attention towards Left environment requiring cuing for attention <75% of the time PT Short Term Goal 5 (Week 1): Pt will complete stairs with MinA   Skilled Therapeutic Interventions/Progress Updates:    Pt received supine in bed. No expressions of pain or discomfort.  Pt indicates needing to use restroom. Pt transferred supine>sitting EOB with CGA after several verbal and large visual cuing.   Pt ambulated from bed<>bathroom household distance with CGA and verbal cuing for left environment navigation. Toilet transfer with CGA/ Min A mainly for correct sequencing as pt attempts to sit to toilet prior to doffing brief. ModA for doffing soiled brief and donning clean brief/pants for management of left side. Pt demos motor perseveration with pull of toilet tissue from holder, pulling large wads of tissue for pericare. Max A to complete peri care.   STS/Stand pivot transfers throughout session CGA no AD. Intermittent tactile cuing with decent phase for shifting over towards right in order to properly position in wc secondary to left inattention.   Standing and reaching cross body/outside BOS activities completed for balance, endurance, scanning towards left visual field, and cognition with color recognition CGA. Intermittent rest breaks required secondary to fatigue.  -Color matching bean bags to circles (red, blue, purple, yellow) x4 rounds. Pt required significant verbal/visual cuing to recognize blue  and purple, min-mod cuing for yellow and red.  -Giant connect four utilized with creating one color horizontal. Next round one color vertical. Pt requires multimodal cuing ~80% of the time to correctly sequence colors. Pt demonstrated ability to complete activity with proper weight shifting, stepping when needed, and bending down/mini-squat to grab color rings throughout activity.  -Bean bag toss towards basket on left x2. Pt required verbal/visual cuing for attention to task, increased toss length, and attention to target at the start of each round, progressing to less cuing.   Gait training no AD:  Around day room with multiple turns both directions for environment scanning/object finding 6 cones x 2 at various heights/viability. First round required multimodal cuing to find 3 of 6 cones progressing to 2 of 6 during second round.  ~167ft from day room back towards pt room CGA. Multimodal cuing for attention to left environment and navigation to room.   Pt sitting in wc with brakes locked, seatbelt alarm set, call bell on tray in front of pt.   Therapy Documentation Precautions:  Precautions Precautions: Fall Precaution Comments: Cortrak, global aphasia, L below elbow amputation hx, L inattention Restrictions Weight Bearing Restrictions: No   Pain: Pain Assessment Pain Scale: Faces Pain Score: 0-No pain Faces Pain Scale: No hurt    Therapy/Group: Individual Therapy  Shiara Mcgough, SPT 08/10/2020, 12:38 PM

## 2020-08-10 NOTE — Progress Notes (Signed)
Occupational Therapy Session Note  Patient Details  Name: Jennifer Lucero MRN: [id number] Date of Birth: 1950/09/23  Today's Date: 08/10/2020 OT Individual Time: 1102-1204 OT Individual Time Calculation (min): 62 min    Short Term Goals: Week 1:  OT Short Term Goal 1 (Week 1): Pt will don shirt with mod A using hemi-technique. OT Short Term Goal 2 (Week 1): Pt will don pants with min A. OT Short Term Goal 3 (Week 1): Pt will stand for > 5 min with CGA in prep for standing ADL. OT Short Term Goal 4 (Week 1): Pt will complete toilet transfer with CGA and LRAD.  Skilled Therapeutic Interventions/Progress Updates:    Pt in bed to start session, agreeable to therapy.  Supine to sit EOB with min guard as well as transfer to the wheelchair.  Pt denied needing to wash up or complete oral hygiene.  Cortrack was disconnected and flushed for session.  She was able to complete brushing her hair with the RUE, but needed mod instructional cueing for thoroughness.  She was taken down to the therapy gym and transferred to the therapy mat with min guard assist.  Had her attempt working on sorting task following design cards, having to arrange colored dots in order to match the card.  She was able to correctly identify the colors needed when therapist would point to them, but could not arrange them in correct sequence, even when broken down to completing only one dot at a time.  Transitioned to identification of plastic letters placed on the table.  Again she was able to correctly pick out the color of the letter when asked, but could not consistently choose the letter given by therapist.  Worked on spelling her name with the letters with max assist and then had her try to reproduce it.  She was unable to complete it without max assist.  She then transferred back to the wheelchair at min guard assist and then was taken down to the [place].  She was able to complete 2 intervals of 2 mins using Visual Scanning Program.   She needed mod instructional cueing to continue scanning for dots with an average number of 3.6 and 4.7 seconds reaction time with accuracy at 66%.  She would frequently press in areas that there were no dots, requiring cueing to sustain attention on the task.  No significant visual field deficit noted however.  Finished session with return to the room and min guard for transfer back to the bed.  Safety belt in place with bed alarm on and telesitter in place.  Cortrak hooked back up as well.    Therapy Documentation Precautions:  Precautions Precautions: Fall Precaution Comments: Cortrak, global aphasia, L below elbow amputation hx, L inattention Restrictions Weight Bearing Restrictions: No  Pain: Pain Assessment Pain Scale: Faces Faces Pain Scale: No hurt ADL: See Care Tool Section for some details of mobility and selfcare   Therapy/Group: Individual Therapy  Arnette Driggs OTR/L 08/10/2020, 3:47 PM

## 2020-08-11 ENCOUNTER — Inpatient Hospital Stay (HOSPITAL_COMMUNITY): Payer: Medicare Other

## 2020-08-11 DIAGNOSIS — I63511 Cerebral infarction due to unspecified occlusion or stenosis of right middle cerebral artery: Secondary | ICD-10-CM | POA: Diagnosis not present

## 2020-08-11 LAB — GLUCOSE, CAPILLARY
Glucose-Capillary: 130 mg/dL — ABNORMAL HIGH (ref 70–99)
Glucose-Capillary: 133 mg/dL — ABNORMAL HIGH (ref 70–99)
Glucose-Capillary: 148 mg/dL — ABNORMAL HIGH (ref 70–99)
Glucose-Capillary: 152 mg/dL — ABNORMAL HIGH (ref 70–99)
Glucose-Capillary: 241 mg/dL — ABNORMAL HIGH (ref 70–99)

## 2020-08-11 MED ORDER — ZINC OXIDE 40 % EX OINT
TOPICAL_OINTMENT | Freq: Four times a day (QID) | CUTANEOUS | Status: DC | PRN
  Filled 2020-08-11 (×2): qty 57

## 2020-08-11 NOTE — Patient Care Conference (Signed)
Inpatient RehabilitationTeam Conference and Plan of Care Update Date: 08/11/2020   Time: 10:06 AM    Patient Name: Jennifer Lucero      Medical Record Number: [id number]  Date of Birth: [birth year] Sex: Female         Room/Bed: 4W12C/4W12C-02 Payor Info: Payor: [employer] MEDICARE / Plan: [facility] MEDICARE / Product Type: *No Product type* /    Admit Date/Time:  08/05/2020  2:59 PM  Primary Diagnosis:  Right middle cerebral artery stroke [facility])  Hospital Problems: Principal Problem:   Right middle cerebral artery stroke [facility]) Active Problems:   Aphasia   Dysphagia   Left hemiparesis [facility])    Expected Discharge Date: Expected Discharge Date: 08/20/20  Team Members Present: Physician leading conference: Dr. Alysia Penna Care Coodinator Present: Dorien Chihuahua, RN, BSN, CRRN;Christina Sampson Goon, [place] Nurse Present: Dorien Chihuahua, RN PT Present: Other (comment) Filbert Berthold, PT) OT Present: Other (comment) Providence Lanius, OT) SLP Present: Other (comment) Annice Needy, SLP)     Current Status/Progress Goal Weekly Team Focus  Bowel/Bladder   Pt noted with loose stools x6 so far this shift.  Regain control and regular pattern of bowel and bladder  Assist with toileting and provide incontinent care as needed.   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  Mod assist for LB dressing and for toileting tasks.  Min to min guard for transfers and short distance mobility without assistive device.  Decreased cognitive processing as well as expressive deficits noted.  supervision overall  selfcare retraining, transfer training, balance retraining, cognitive retraining, therapeutic activites, pt/family education   Mobility   Bed mobility = supervision/ Mod I;  Transfers = supervision, gait = supervision/ CGA  Overall Mod I/ supervision  safety awareness, cognition and following instructions, decreased LOA for transfers/ gait, initiate stair training   Communication   Dependent for verbal expression, mod-to-max A for receptive, max A-to-dependent for reading and writing.  Mod A  following simple commands, yes/no questions, intentional vocalizations,overall multimodal communication through gestures/communication board, etc.   Safety/Cognition/ Behavioral Observations            Pain   Denies pain.  Remain free of pain  Assess and address pain every shift.   Skin   Perineum, groin, intergluteal folds very red and excorated, with bruising noted to RUE and abdomen, skin tear noted to RUE.  No s/sx of infection. No further areas of skin impairment.  Assess and address skin issues every shift. Continue wound care.     Discharge Planning:      Team Discussion: Patient incontinent of bowel and bladder; loose stools attributed to tube feeding; does not meet criteria for C-diff at present. Cognitive issues post Right MCA with slow processing but physically does well. Patient with poor safety awareness but able to follow instructions and working on multi-step directions.   Patient on target to meet rehab goals: yes, currently transfers with supervision, and gait supervision to CGA. For OT, requires min assist for grooming at the sink. Needs min assist for upper body care and min - mod assist for toileting and bathing lower body. Requires mod assist for lower body dressing at the sink level. Requires total assist for oral care. Requires mod - max assist for recall and language. Requires max  assist for complex problem solving, following instructions and cannot read or write. No verbal communication due to oral motor deficits and is not appropriate for po trials. Recommend PEG placement for nutritional means. Mod I goals set for discharge.  *See Care Plan and  progress notes for long and short-term goals.   Revisions to Treatment Plan:  PEG placement recommended   Teaching Needs: Medications, nutritional means, secondary stroke risk management, transfers, safety, etc.  Current Barriers to Discharge: Decreased caregiver support, Home enviroment access/layout, Incontinence, and Nutritional means  Possible Resolutions to Barriers: Family education w spouse     Medical Summary Current Status: Loose stools incont bowel and bladder  Barriers to Discharge: Medical stability;Nutrition means;Pending Surgery   Possible Resolutions to Barriers/Weekly Focus: Will need to get consult for PEG   Continued Need for Acute Rehabilitation Level of Care: The patient requires daily medical management by a physician with specialized training in physical medicine and rehabilitation for the following reasons: Direction of a multidisciplinary physical rehabilitation program to maximize functional independence : Yes Medical management of patient stability for increased activity during participation in an intensive rehabilitation regime.: Yes Analysis of laboratory values and/or radiology reports with any subsequent need for medication adjustment and/or medical intervention. : Yes   I attest that I was present, lead the team conference, and concur with the assessment and plan of the team.   Dorien Chihuahua B 08/11/2020, 12:55 PM

## 2020-08-11 NOTE — Progress Notes (Signed)
Pt incontinent of loose stools x6 so far this shift. Incontinent care provided. Intergluteal folds and perineum remain red and very excoriated. Antifungal powder applied as ordered. Pt resistive to personal care throughout the night. TF continues without difficulty.

## 2020-08-11 NOTE — Progress Notes (Addendum)
PROGRESS NOTE   Subjective/Complaints: Loose stools continue, no abd pain no vomiting, has TF  Discussed with SLP, poor prognosis for rapid recovery of swallow Discussed need for PEG with pt and her husband who agrees, pt aphasic and unable to give consent Review of systems limited by aphasia Objective:   No results found. Recent Labs    08/09/20 0837  WBC 8.4  HGB 10.0*  HCT 31.7*  PLT 443*    Recent Labs    08/09/20 0837  NA 140  K 4.2  CL 106  CO2 26  GLUCOSE 232*  BUN 30*  CREATININE 1.05*  CALCIUM 9.5     Intake/Output Summary (Last 24 hours) at 08/11/2020 0935 Last data filed at 08/11/2020 0510 Gross per 24 hour  Intake 0 ml  Output --  Net 0 ml         Physical Exam: Vital Signs Blood pressure (!) 109/57, pulse 75, temperature (!) 97.5 F (36.4 C), resp. rate 20, height 5\' 4"  (1.626 m), weight 93.5 kg, SpO2 94 %.    General: No acute distress Mood and affect are appropriate Heart: Regular rate and rhythm no rubs murmurs or extra sounds Lungs: Clear to auscultation, breathing unlabored, no rales or wheezes Abdomen: Positive bowel sounds, soft nontender to palpation, nondistended Extremities: No clubbing, cyanosis, or edema Skin: No evidence of breakdown, no evidence of rash  Neurologic: Cranial nerves II through XII intact, motor strength is 5/5 in right and 4/5 left deltoid, bicep, tricep, grip, hip flexor, knee extensors, ankle dorsiflexor and plantar flexor  Musculoskeletal: Full range of motion in all 4 extremities, no pain with upper extremity or lower extremity range of motion.. No joint swelling Status post left hand amputation, chronic   Assessment/Plan: 1. Functional deficits which require 3+ hours per day of interdisciplinary therapy in a comprehensive inpatient rehab setting. Physiatrist is providing close team supervision and 24 hour management of active medical problems listed  below. Physiatrist and rehab team continue to assess barriers to discharge/monitor patient progress toward functional and medical goals  Care Tool:  Bathing    Body parts bathed by patient: Right arm, Left arm, Chest, Abdomen, Front perineal area, Right upper leg, Left upper leg   Body parts bathed by helper: Buttocks, Right lower leg, Left lower leg     Bathing assist Assist Level: Minimal Assistance - Patient > 75%     Upper Body Dressing/Undressing Upper body dressing   What is the patient wearing?: Pull over shirt    Upper body assist Assist Level: Maximal Assistance - Patient 25 - 49%    Lower Body Dressing/Undressing Lower body dressing      What is the patient wearing?: Pants, Underwear/pull up     Lower body assist Assist for lower body dressing: Moderate Assistance - Patient 50 - 74%     Toileting Toileting    Toileting assist Assist for toileting: Moderate Assistance - Patient 50 - 74%     Transfers Chair/bed transfer  Transfers assist     Chair/bed transfer assist level: Contact Guard/Touching assist     Locomotion Ambulation   Ambulation assist      Assist level: Minimal Assistance -  Patient > 75% Assistive device: No Device Max distance: 5'   Walk 10 feet activity   Assist     Assist level: Moderate Assistance - Patient - 50 - 74% (+2 wc follow) Assistive device: Hand held assist   Walk 50 feet activity   Assist    Assist level: Moderate Assistance - Patient - 50 - 74% Assistive device: Hand held assist    Walk 150 feet activity   Assist Walk 150 feet activity did not occur: Safety/medical concerns         Walk 10 feet on uneven surface  activity   Assist Walk 10 feet on uneven surfaces activity did not occur: Safety/medical concerns         Wheelchair     Assist Will patient use wheelchair at discharge?: No      Wheelchair assist level: Moderate Assistance - Patient 50 - 74% Max wheelchair distance:  75ft    Wheelchair 50 feet with 2 turns activity    Assist    Wheelchair 50 feet with 2 turns activity did not occur: Safety/medical concerns   Assist Level: Maximal Assistance - Patient 25 - 49% (maxA for two turns)   Wheelchair 150 feet activity     Assist  Wheelchair 150 feet activity did not occur: Safety/medical concerns       Blood pressure (!) 109/57, pulse 75, temperature (!) 97.5 F (36.4 C), resp. rate 20, height 5\' 4"  (1.626 m), weight 93.5 kg, SpO2 94 %.   Medical Problem List and Plan: 1.  Side weakness with aphasia/dysphagia secondary to right MCA infarction secondary to right M2 occlusion status post mechanical thrombectomy             -patient may shower once Tfs aren't continuous             -ELOS/Goals: 2.5-3 weeks supervision to min A- team conf in am   -con't CIR- PT, OT, and SLP- restraints required since pulled Feeding tube out- getting OOB which is very unsafe.   -con't PT, OT and SLP- will stop waist restraint and use Posey and mitten if needed- and telesitter to stop restraints.  2.  Antithrombotics: -DVT/anticoagulation: Subcutaneous heparin             -antiplatelet therapy: Aspirin 325 mg daily.  Consider to resume Eliquis 10 to 14 days given large infarct with hemorrhagic transformation as noted per neurology services. 3. Pain Management: Tylenol as needed 4. Mood: Provide emotional support             -antipsychotic agents: Seroquel 25 mg nightly  7/17- sleeping OK per staff- con't regimen 5. Neuropsych: This patient is not capable of making decisions on her own behalf. 6. Skin/WounModerate d Care: Routine skin checks  7/17- MASD- will con't Gerhardt's and cream- received 3 doses of  IV Diflucan with much less redness, will d/c 7. Fluids/Electrolytes/Nutrition: Routine in and outs with follow-up chemistries 8.  Seizure prophylaxis.  EEG negative.  Keppra 500 mg twice daily. 9.  Dysphagia.  NPO.  Alternative means of nutritional support.   Dietary follow-up- con't Cortrak for now-consult IR for  PEG.  needs R mitten  as well as soft waist restraint 10.  Atrial fibrillation.  Lopressor 25 mg twice daily.  Await plans to resume Eliquis 11.  Hypertension.  Isordil 60 mg twice daily, lisinopril 20 mg daily.  Monitor with increased mobility 12.  Diabetes mellitus.  Hemoglobin A1c 9.6.  NovoLog 2 units every 4 hours, Lantus insulin  20 units daily.  Check blood sugars before meals and at bedtime CBG (last 3)  Recent Labs    08/10/20 2022 08/11/20 0005 08/11/20 0413  GLUCAP 162* 148* 152*   Improved cont novalog  3U 13.  Hyperlipidemia.  Pravachol 14. Expressive aphasia- needs communication board. 15. Urinary incontinence- having since stroke- might benefit initially from [place] at NIGHT- explained working on toileting program during day.  16.  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 RUE skin tear- skin care being done- locally 18.  Loose incont stools - no recent abx abd exam unremarkable , may be TF related +/- meds (pravastatin or Diflucan) will stop diflucan as cutaneous yeast infx cleared   Check stool for C diff if not allowed by protocol will start immodium, presume this is TF related LOS: 6 days A FACE TO FACE EVALUATION WAS PERFORMED  Charlett Blake 08/11/2020, 9:35 AM

## 2020-08-11 NOTE — Progress Notes (Signed)
Speech Language Pathology Daily Session Note  Patient Details  Name: Jennifer Lucero MRN: [id number] Date of Birth: 11-10-1950  Today's Date: 08/11/2020 SLP Individual Time: 1400-1500 SLP Individual Time Calculation (min): 60 min  Short Term Goals: Week 1: SLP Short Term Goal 1 (Week 1): Patient will consume ice chips trials with minimal overt s/sx of aspiration to indicate readiness to participate in instrumental swallow assessment (MBS). SLP Short Term Goal 2 (Week 1): Patient will use gestures/pointing to communicate basic wants and needs with mod A verbal/visual cues SLP Short Term Goal 3 (Week 1): Patient will identify objects within a visual field of 3 with 50% accuracy and max A verbal/visual cues SLP Short Term Goal 4 (Week 1): Patient will exhibit meaningful vocalization during structured speech/language tasks (e.g., automatic speech) with max verbal/visual/tactile cues. SLP Short Term Goal 5 (Week 1): Following a visual model, patient will produce 10 repetitions of the oral motor movement with max verbal/visual/tactile cues.  Skilled Therapeutic Interventions: Patient agreeable to skilled ST intervention with focus on swallowing, communication, and family education. Patient's spouse was present during today's session. Discussed consideration of long term source of nutrition vs. Short-term NG tube due to severity of oral motor function negatively impacting swallow function. Spouse reported he spoke with MD earlier and agreeable to this. SLP asked patient if she was agreeable to this different ways during 3 occasions to ensure understanding 2/2 receptive language deficits. Patient nodded her head yes each time. Provided education to spouse on current oral motor/swallowing deficits, NPO status, and expressive/receptive aphasia. Spouse displayed decreased insight into patient's deficits and will benefit from additional education/training. Facilitated object identification within field of 3 using  objects from [employer] 50% accuracy at independent level, 78% accuracy with mod A verbal/visual cues by reducing field to 2 options. Letter identification in field of 3 with 50% accuracy with max A verbal/visual cues. Possible false positives. Word identification with 0% accuracy with max A verbal/visual cues. Consistent verbal cueing needed for visual scanning to left. Oral care completed with max A. Patient exhibited difficulty executing volitional oral motor movements for oral care. Patient was left in wheelchair with alarm activated, soft mit in place, and immediate needs within reach at end of session. Continue per current plan of care.      Pain Pain Assessment Pain Scale: 0-10 Pain Score: 0-No pain  Therapy/Group: Individual Therapy  Minyon Billiter T Fransheska Willingham 08/11/2020, 3:12 PM

## 2020-08-11 NOTE — Progress Notes (Signed)
Physical Therapy Session Note  Patient Details  Name: Jennifer Lucero MRN: [id number] Date of Birth: Dec 24, 1950  Today's Date: 08/11/2020 PT Individual Time:  0805-0900  PT Individual Time Calculation (min): 55 min    Short Term Goals: Week 1:  PT Short Term Goal 1 (Week 1): Pt will consistently complete rolling ModA PT Short Term Goal 2 (Week 1): Pt will consistently complete supine <> sit ModA PT Short Term Goal 3 (Week 1): Pt will ambulate 149ft MinA PT Short Term Goal 4 (Week 1): Pt will improve attention towards Left environment requiring cuing for attention <75% of the time PT Short Term Goal 5 (Week 1): Pt will complete stairs with MinA  Skilled Therapeutic Interventions/Progress Updates:  Patient seated upright in w/c on entrance to room. Patient alert and agreeable to PT session. Patient denied pain during session. Has showered and indicates feeling better after shower and hair washing. Cortrack feeding active and pt requires donning of brief and pants prior to leaving room for therapy despite nursing orders for no briefs to be donned to pt.   Therapeutic Activity: Transfers: Patient performed STS and SPVT transfers throughout session with close supervision. Provided verbal cues for reaching back to w/c or seated armrest prior to all descent to sit.   Gait Training:  Patient ambulated 160 feet using no AD but managing IV pole with CGA. Demonstrated good ability to manage pole throughout crowded hallway and around numerous obstacles while maintaining decreased (self-managed) pace in order to properly manage pole. Provided vc/ tc for visually scanning ahead for additional obstacles.  Neuromuscular Re-ed: NMR facilitated during session with focus on standing balance, following instructions. Pt guided in reach for items in room. Ambulation throughout room and guided in touching to items at differing heights and distances requiring problem solving for safe reach. Performs well with no  LOB and good judgements in positioning for reaches. NMR performed for improvements in motor control and coordination, balance, sequencing, judgement, and self confidence/ efficacy in performing all aspects of mobility at highest level of independence.   Therapeutic Exercise: Pt guided in continuous reciprocation of BUE and BLE using NuStep L3 x 24min with focus on continuous movement and maintaining speed. Pt relates ability to see numbers on screen and indicates time of activity and resistance level.     Therapy Documentation Precautions:  Precautions Precautions: Fall Precaution Comments: Cortrak, global aphasia, L below elbow amputation hx, L inattention Restrictions Weight Bearing Restrictions: No Therapy/Group: Individual Therapy  Alger Simons PT, DPT 08/11/2020, 8:15 AM

## 2020-08-11 NOTE — Progress Notes (Signed)
Occupational Therapy Session Note  Patient Details  Name: Jennifer Lucero MRN: [id number] Date of Birth: 16-May-1950  Today's Date: 08/11/2020 OT Individual Time: 0700-0755 OT Individual Time Calculation (min): 55 min + 24 min   Short Term Goals: Week 1:  OT Short Term Goal 1 (Week 1): Pt will don shirt with mod A using hemi-technique. OT Short Term Goal 2 (Week 1): Pt will don pants with min A. OT Short Term Goal 3 (Week 1): Pt will stand for > 5 min with CGA in prep for standing ADL. OT Short Term Goal 4 (Week 1): Pt will complete toilet transfer with CGA and LRAD.  Skilled Therapeutic Interventions/Progress Updates:    Session 1 [phone]): Pt received seated up in bed, appears agreeable to therapy. No signs or sx of pain throughout session. Session focus on self-care retraining, activity tolerance, func mobility, func cognition and command follow in prep for improved ADL/IADL/func mobility performance + decreased caregiver burden. Pt found to have been incontinent of bowel in bed. Pt is able to assist cleaning periarea and thighs with overall min to mod A for thoroughness 2/2 poor awareness. Donned loose pants with min A to thread RLE and pull over L hip. No briefs placed per RN note in room. Doffed shirt with min A to pull over head, bathed UB with min A for thoroughness, and donned new shirt with min A to thread LUE. Noted L inattention of LUE throughout session, req mod tactile cues to incorporate LUE into morning routine. RN in/out for morning introduction. Pt amb to bathroom with CGA, mod A for LB clothing management and min A to initiate pericare post continent void of bladder. Assisted pt with hair care, cut out matted knot in back of hair (with husband's permission in previous session). Directed pt in following commands to assist with hair cut, but pt easily distracted by busy environment of hallway.  Pt left seated in recliner with safety belt alarm engaged, telesitter on, call bell in  reach, and all immediate needs met.    Session 2 [phone]): Pt received seated in w/c with husband present, agreeable to therapy. Session focus on self-care retraining, activity tolerance, func cog and problem solving, and functional mobility in prep for improved ADL/IADL/func mobility performance + decreased caregiver burden. Amb to and from toilet with CGA and furniture walking. CGA for LB clothing management and pericare. No further void. Amb back to w/c CGA. Copied 1 level 1 difficulty design on peg board with overall max A to accuratley complete, noted improvement since last week in ability to select appropriate colors, but cont to have difficulty locating holes to place peg. Able to copy design on 1 color with mod A after initial demonstration. However, when presented yes/no sign, inaccurately answering "is this peg red." Family edu with husband re current ADL performance and decreased falls risks in home prior to DC. Plans to bring in pt's own clothes. Pt did demonstrate ability to accurately hit nurses call button when prompted.  Pt left seated in recliner with safety belt alarm engaged, telesitter on, call bell in reach, and all immediate needs met.    Therapy Documentation Precautions:  Precautions Precautions: Fall Precaution Comments: Cortrak, global aphasia, L below elbow amputation hx, L inattention Restrictions Weight Bearing Restrictions: No  Pain: no s/sx    ADL: See Care Tool for more details.  Therapy/Group: Individual Therapy  [name] MS, OTR/L  08/11/2020, 6:37 AM

## 2020-08-11 NOTE — Progress Notes (Signed)
Patient ID: Jennifer Lucero, female   DOB: 1950-12-01, 70 y.o.   MRN: [id number] Team Conference Report to Patient/Family  Team Conference discussion was reviewed with the patient and caregiver, including goals, any changes in plan of care and target discharge date.  Patient and caregiver express understanding and are in agreement.  The patient has a target discharge date of 08/20/20.  SW met with patient in spouse in room. Provided conference updates. Pt spouse on board with patient having PEG at discharge. Spouse has questions for Dr. Raliegh Ip in regards to patient "dipping" at discharge. Family education scheduled July 26 and 27th 1-4 PM  [name] 08/11/2020, 1:13 PM

## 2020-08-12 ENCOUNTER — Encounter (HOSPITAL_COMMUNITY): Payer: Self-pay | Admitting: Physical Medicine & Rehabilitation

## 2020-08-12 DIAGNOSIS — I63511 Cerebral infarction due to unspecified occlusion or stenosis of right middle cerebral artery: Secondary | ICD-10-CM | POA: Diagnosis not present

## 2020-08-12 LAB — GLUCOSE, CAPILLARY
Glucose-Capillary: 172 mg/dL — ABNORMAL HIGH (ref 70–99)
Glucose-Capillary: 176 mg/dL — ABNORMAL HIGH (ref 70–99)
Glucose-Capillary: 180 mg/dL — ABNORMAL HIGH (ref 70–99)
Glucose-Capillary: 183 mg/dL — ABNORMAL HIGH (ref 70–99)
Glucose-Capillary: 211 mg/dL — ABNORMAL HIGH (ref 70–99)
Glucose-Capillary: 211 mg/dL — ABNORMAL HIGH (ref 70–99)

## 2020-08-12 MED ORDER — PROSOURCE TF PO LIQD
45.0000 mL | Freq: Two times a day (BID) | ORAL | Status: DC
  Administered 2020-08-12 – 2020-08-20 (×15): 45 mL
  Filled 2020-08-12 (×15): qty 45

## 2020-08-12 MED ORDER — DIPHENOXYLATE-ATROPINE 2.5-0.025 MG/5ML PO LIQD
5.0000 mL | Freq: Four times a day (QID) | ORAL | Status: DC
  Administered 2020-08-12 – 2020-08-20 (×28): 5 mL
  Filled 2020-08-12 (×27): qty 5

## 2020-08-12 MED ORDER — OSMOLITE 1.5 CAL PO LIQD
1000.0000 mL | ORAL | Status: DC
  Administered 2020-08-12 – 2020-08-15 (×3): 1000 mL
  Filled 2020-08-12 (×2): qty 1000

## 2020-08-12 MED ORDER — CEFAZOLIN SODIUM-DEXTROSE 2-4 GM/100ML-% IV SOLN: 2.0000 g | INTRAVENOUS | Status: AC

## 2020-08-12 NOTE — Progress Notes (Signed)
PROGRESS NOTE   Subjective/Complaints: Appreciate CT abdomen, not seen yet by IR but CT abdomen was ordered.  Looks like anatomy would lend itself to percutaneous placement of gastrostomy Working with speech on humming Review of systems limited by aphasia Objective:   CT ABDOMEN WO CONTRAST  Result Date: 08/11/2020 CLINICAL DATA:  Gastrostomy status EXAM: CT ABDOMEN WITHOUT CONTRAST TECHNIQUE: Multidetector CT imaging of the abdomen was performed following the standard protocol without IV contrast. COMPARISON:  08/26/2004 FINDINGS: Lower chest: No acute abnormality. Hepatobiliary: No focal liver abnormality is seen. No gallstones, gallbladder wall thickening, or biliary dilatation. Pancreas: Unremarkable. No pancreatic ductal dilatation or surrounding inflammatory changes. Spleen: Normal in size without focal abnormality. Adrenals/Urinary Tract: Adrenal glands are unremarkable. Kidneys are normal, without renal calculi, focal lesion, or hydronephrosis. Stomach/Bowel: Enteric tube terminates in the gastric lumen. Anatomy is appropriate for percutaneous gastrostomy tube placement. No dilated loops of bowel identified within the visualized abdomen. Vascular/Lymphatic: No enlarged abdominal lymph nodes. Other: No abdominal wall hernia or abnormality. Musculoskeletal: No acute or significant osseous findings. IMPRESSION: Anatomy appropriate for percutaneous G-tube placement. Electronically Signed   By: Miachel Roux M.D.   On: 08/11/2020 16:25   No results for input(s): WBC, HGB, HCT, PLT in the last 72 hours.  No results for input(s): NA, K, CL, CO2, GLUCOSE, BUN, CREATININE, CALCIUM in the last 72 hours.   Intake/Output Summary (Last 24 hours) at 08/12/2020 0911 Last data filed at 08/12/2020 0700 Gross per 24 hour  Intake 0 ml  Output --  Net 0 ml         Physical Exam: Vital Signs Blood pressure (!) 119/57, pulse 82, temperature (!)  97.4 F (36.3 C), temperature source Oral, resp. rate 18, height '5\' 4"'$  (1.626 m), weight 94.3 kg, SpO2 98 %.  General: No acute distress Mood and affect are appropriate Heart: Regular rate and rhythm no rubs murmurs or extra sounds Lungs: Clear to auscultation, breathing unlabored, no rales or wheezes Abdomen: Positive bowel sounds, soft nontender to palpation, nondistended Extremities: No clubbing, cyanosis, or edema Skin: No evidence of breakdown, no evidence of rash   Neurologic: Cranial nerves II through XII intact, motor strength is 5/5 in right and 4/5 left deltoid, bicep, tricep, grip, hip flexor, knee extensors, ankle dorsiflexor and plantar flexor  Musculoskeletal: Full range of motion in all 4 extremities, no pain with upper extremity or lower extremity range of motion.. No joint swelling Status post left hand amputation, chronic   Assessment/Plan: 1. Functional deficits which require 3+ hours per day of interdisciplinary therapy in a comprehensive inpatient rehab setting. Physiatrist is providing close team supervision and 24 hour management of active medical problems listed below. Physiatrist and rehab team continue to assess barriers to discharge/monitor patient progress toward functional and medical goals  Care Tool:  Bathing    Body parts bathed by patient: Right arm, Left arm, Chest, Abdomen, Front perineal area, Right upper leg, Left upper leg   Body parts bathed by helper: Buttocks, Right lower leg, Left lower leg     Bathing assist Assist Level: Minimal Assistance - Patient > 75%     Upper Body Dressing/Undressing Upper body dressing  What is the patient wearing?: Pull over shirt    Upper body assist Assist Level: Minimal Assistance - Patient > 75%    Lower Body Dressing/Undressing Lower body dressing      What is the patient wearing?: Pants     Lower body assist Assist for lower body dressing: Minimal Assistance - Patient > 75%      Toileting Toileting    Toileting assist Assist for toileting: Contact Guard/Touching assist     Transfers Chair/bed transfer  Transfers assist     Chair/bed transfer assist level: Contact Guard/Touching assist     Locomotion Ambulation   Ambulation assist      Assist level: Minimal Assistance - Patient > 75% Assistive device: No Device Max distance: 5'   Walk 10 feet activity   Assist     Assist level: Moderate Assistance - Patient - 50 - 74% (+2 wc follow) Assistive device: Hand held assist   Walk 50 feet activity   Assist    Assist level: Moderate Assistance - Patient - 50 - 74% Assistive device: Hand held assist    Walk 150 feet activity   Assist Walk 150 feet activity did not occur: Safety/medical concerns         Walk 10 feet on uneven surface  activity   Assist Walk 10 feet on uneven surfaces activity did not occur: Safety/medical concerns         Wheelchair     Assist Will patient use wheelchair at discharge?: No      Wheelchair assist level: Moderate Assistance - Patient 50 - 74% Max wheelchair distance: 20f    Wheelchair 50 feet with 2 turns activity    Assist    Wheelchair 50 feet with 2 turns activity did not occur: Safety/medical concerns   Assist Level: Maximal Assistance - Patient 25 - 49% (maxA for two turns)   Wheelchair 150 feet activity     Assist  Wheelchair 150 feet activity did not occur: Safety/medical concerns       Blood pressure (!) 119/57, pulse 82, temperature (!) 97.4 F (36.3 C), temperature source Oral, resp. rate 18, height '5\' 4"'$  (1.626 m), weight 94.3 kg, SpO2 98 %.   Medical Problem List and Plan: 1.  Side weakness with aphasia/dysphagia secondary to right MCA infarction secondary to right M2 occlusion status post mechanical thrombectomy             -patient may shower once Tfs aren't continuous             -ELOS/Goals: 2.5-3 weeks supervision to min A- team conf in am    -con't CIR- PT, OT, and SLP- restraints required since pulled Feeding tube out- getting OOB which is very unsafe.   -con't PT, OT and SLP- will stop waist restraint and use Posey and mitten if needed- and telesitter to stop restraints.  2.  Antithrombotics: -DVT/anticoagulation: Subcutaneous heparin             -antiplatelet therapy: Aspirin 325 mg daily.  Consider to resume Eliquis 10 to 14 days given large infarct with hemorrhagic transformation as noted per neurology services. 3. Pain Management: Tylenol as needed 4. Mood: Provide emotional support             -antipsychotic agents: Seroquel 25 mg nightly  7/17- sleeping OK per staff- con't regimen 5. Neuropsych: This patient is not capable of making decisions on her own behalf. 6. Skin/WounModerate d Care: Routine skin checks  7/17- MASD- will con't Gerhardt's  and cream- received 3 doses of  IV Diflucan with much less redness, will d/c 7. Fluids/Electrolytes/Nutrition: Routine in and outs with follow-up chemistries 8.  Seizure prophylaxis.  EEG negative.  Keppra 500 mg twice daily. 9.  Dysphagia.  NPO.  Alternative means of nutritional support.  Dietary follow-up- con't Cortrak for now-consult IR for  PEG.  needs R mitten  as well as soft waist restraint 10.  Atrial fibrillation.  Lopressor 25 mg twice daily.  Await plans to resume Eliquis 11.  Hypertension.  Isordil 60 mg twice daily, lisinopril 20 mg daily.  Monitor with increased mobility 12.  Diabetes mellitus.  Hemoglobin A1c 9.6.  NovoLog 2 units every 4 hours, Lantus insulin 20 units daily.  Check blood sugars before meals and at bedtime CBG (last 3)  Recent Labs    08/11/20 2030 08/12/20 0003 08/12/20 0439  GLUCAP 133* 211* 180*   Improved cont novalog  3U, may need to increase Lantus we will continue to observe 13.  Hyperlipidemia.  Pravachol 14. Expressive aphasia- needs communication board. 15. Urinary incontinence- having since stroke-likely urgency plus communication  issues 16.  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 RUE skin tear- skin care being done- locally 18.  Loose incont stools - no recent abx abd exam unremarkable , may be TF related +/- meds (pravastatin or Diflucan) will stop diflucan as cutaneous yeast infx cleared   Check stool for C diff if not allowed by protocol will start immodium, presume this is TF related LOS: 7 days A FACE TO FACE EVALUATION WAS PERFORMED  Jennifer Lucero 08/12/2020, 9:11 AM

## 2020-08-12 NOTE — Progress Notes (Signed)
Speech Language Pathology Daily Session Note  Patient Details  Name: Jennifer Lucero MRN: [id number] Date of Birth: 1950/06/25  Today's Date: 08/12/2020 SLP Individual Time: 0803-0900 SLP Individual Time Calculation (min): 57 min  Short Term Goals: Week 1: SLP Short Term Goal 1 (Week 1): Patient will consume ice chips trials with minimal overt s/sx of aspiration to indicate readiness to participate in instrumental swallow assessment (MBS). SLP Short Term Goal 2 (Week 1): Patient will use gestures/pointing to communicate basic wants and needs with mod A verbal/visual cues SLP Short Term Goal 3 (Week 1): Patient will identify objects within a visual field of 3 with 50% accuracy and max A verbal/visual cues SLP Short Term Goal 4 (Week 1): Patient will exhibit meaningful vocalization during structured speech/language tasks (e.g., automatic speech) with max verbal/visual/tactile cues. SLP Short Term Goal 5 (Week 1): Following a visual model, patient will produce 10 repetitions of the oral motor movement with max verbal/visual/tactile cues.  Skilled Therapeutic Interventions: Patient agreeable to skilled ST intervention with focus on swallowing and communication goals. Facilitated oral care with max-to-total A. Patient exhibited increased oral defensiveness and difficulty opening mouth to accept oral swab. Unable to execute volitional oral motor movements despite max verbal/visual/tactile cueing. Unable to execute volitional swallow. Spontaneous swallows were exhibited throughout treatment and x2 following oral care and with clean oral swab dipped in water. No pharyngeal symptoms observed. Allow patient to have CLEAN oral swab dipped in water for oral comfort intermittently with supervision. Informed MD and wrote instructions on yellow dysphagia swallow precautions sign above bed. Facilitated yes/no questions for patient to respond via head nod to biographical questions with 50% accuracy. Suspect possible  false positives with tendency to nod head yes. Patient was oriented to person and situation via head nod to yes/no to orientation questions. Facilitated automatic speech tasks and singing with max A verbal and tactile cueing. Counting 1-10: patient vocalized during 20% of the time. Happy Birthday: vocalized approximately 10% of the time. Amazing grace: vocalized 10% of the time. Patient was able to communicate her needs with pointing this date with ~75% effectiveness and responding to f/u y/n questions. Patient was left in bed with alarm activated, soft mit in place, and call bell within reach at end of session. Continue per current plan of care.      Pain Pain Assessment Pain Scale: 0-10 Pain Score: 0-No pain  Therapy/Group: Individual Therapy  Patty Sermons 08/12/2020, 8:29 AM

## 2020-08-12 NOTE — Progress Notes (Signed)
Physical Therapy Session Note  Patient Details  Name: Jennifer Lucero MRN: [id number] Date of Birth: [birth year]  Today's Date: 08/12/2020 PT Individual Time: 2179-8102 PT Individual Time Calculation (min): 25 min   Short Term Goals: Week 1:  PT Short Term Goal 1 (Week 1): Pt will consistently complete rolling ModA PT Short Term Goal 2 (Week 1): Pt will consistently complete supine <> sit ModA PT Short Term Goal 3 (Week 1): Pt will ambulate 112ft MinA PT Short Term Goal 4 (Week 1): Pt will improve attention towards Left environment requiring cuing for attention <75% of the time PT Short Term Goal 5 (Week 1): Pt will complete stairs with MinA  Skilled Therapeutic Interventions/Progress Updates:  Pt received sitting in WC in room. Denies pain and is agreeable to PT. Emphasis of session on completing Berg Balance Test that pt had started in previous session. Sit <> stand w/CGA without AD. Pt initially completed single leg stance, turning to look over shoulder and alt toe taps to step without use of AD, as pt adamant she did not want to use RW. Heavy verbal cues required for directions for each task. Noted pt was holding onto chair for each task so reassessed using RW. Sit <> stand w/RW and CGA. When pt asked to alt. Feet on a step, pt repeatedly used R foot and did not respond consistently to verbal cues to use left. Pt scored a 31/56. Pt was left sitting in WC in room with all needs in reach.   Therapy Documentation Precautions:  Precautions Precautions: Fall Precaution Comments: Cortrak, global aphasia, L below elbow amputation hx, L inattention Restrictions Weight Bearing Restrictions: No   Therapy/Group: Individual Therapy  Cruzita Lederer Jada Kuhnert, PT, DPT  08/12/2020, 7:39 AM

## 2020-08-12 NOTE — Progress Notes (Signed)
IR was requested for image guided G tube placement.   Case was discussed with Mrs. Carbone,  she is agreeable to proceed.  Called Mr. Lusher twice to obtain consent, no call back received.   The procedure is tentatively scheduled for tomorrow ONLY IF consent is obtained from Mr. Korus today.   Ordered to stop tube feeding at MN. Ancef ordered, to be given prior to the procedure.  Valorie Mcgrory H Daylyn Azbill PA-C 08/12/2020 2:08 PM

## 2020-08-12 NOTE — Progress Notes (Signed)
Occupational Therapy Session Note  Patient Details  Name: Jennifer Lucero MRN: [id number] Date of Birth: 01/07/1951  Today's Date: 08/12/2020 OT Individual Time: 1000-1053 OT Individual Time Calculation (min): 53 min    Short Term Goals: Week 1:  OT Short Term Goal 1 (Week 1): Pt will don shirt with mod A using hemi-technique. OT Short Term Goal 2 (Week 1): Pt will don pants with min A. OT Short Term Goal 3 (Week 1): Pt will stand for > 5 min with CGA in prep for standing ADL. OT Short Term Goal 4 (Week 1): Pt will complete toilet transfer with CGA and LRAD.  Skilled Therapeutic Interventions/Progress Updates:    Pt received seated in bed with nursing staff present, appears agreeable to ADL. Session focus on self-care retraining, activity tolerance, func transfers, command follow, L inattention, func cog in prep for improved ADL/IADL/func mobility performance + decreased caregiver burden. Pt came sitting EOB with close S and visual gestures used for initiation. Noted to be incontinent of loose stool, req total bed sheet and clothing changed. Doffed hospital gown total A 2/2 poor command follow, donned new one with min A to thread LUE. Pt able to clean UB with mod Vcs for thoroughness and LB with min A for thoroughness in periarea. Pt again, becoming incontinent of loose stool during clothing change. No briefs placed per RN sign in room. Stand-pivot to w/c CGA.   Pt presented with tooth paste and comb, pt unable to accurately point at requested object in 0/8 trials. Pt presented with three colors, able to accurately point to requested color in first attempt in 4/10 trials. Finally, pt instructed to "show me how to use this comb," but pt putting it towards her mouth. Was able to wipe her mouth when instructed "show me how to use these tissues." Pt was additionally, able to suction mouth with yonker with overall min A for thoroughness.   Pt left seated in w/c with safety belt alarm engaged,  telesitter on, call bell in reach, and all immediate needs met.    Therapy Documentation Precautions:  Precautions Precautions: Fall Precaution Comments: Cortrak, global aphasia, L below elbow amputation hx, L inattention Restrictions Weight Bearing Restrictions: No  Pain: no s/sx throughout session   ADL: See Care Tool for more details.  Therapy/Group: Individual Therapy  [name] MS, OTR/L  08/12/2020, 6:42 AM

## 2020-08-12 NOTE — Progress Notes (Signed)
Nutrition Follow-up  DOCUMENTATION CODES:   Obesity unspecified  INTERVENTION:  Initiate Osmolite 1.5 cal formula via tube at rate of 40 ml/hr and increase by 10 ml every 4 hours to goal rate of 60 ml/hr x 20 hours (may hold TF for up to 4 hours for therapy).  Provide 45 ml Prosource TF BID per tube.   Provide free water flushes of 200 ml TID per tube.   Tube feeding regimen provides 1880 kcal, 97 grams of protein, and 1512 ml free water.   NUTRITION DIAGNOSIS:   Inadequate oral intake related to inability to eat as evidenced by NPO status; ongoing  GOAL:   Patient will meet greater than or equal to 90% of their needs; met with TF  MONITOR:   TF tolerance, Skin, Weight trends, Labs, I & O's  REASON FOR ASSESSMENT:   Consult Enteral/tube feeding initiation and management  ASSESSMENT:   70 year old female with history of atrial fibrillation, diabetes mellitus, hypertension hyperlipidemia presents with acute onset of left-sided weakness and rightward gaze as well as aphasia.  Cranial CT scan showed right insular and Peri insular acute infarct as well as old left MCA infarction. CT angiogram demonstrated right M2 occlusion. Underwent mechanical thrombectomy. MRI follow-up showed evolving acute to early subacute right MCA distribution infarct associated petechial hemorrhage. Patient currently n.p.o. with alternative means of nutritional support. Pt with left-sided weakness and decreased functional mobility was admitted for a comprehensive rehab program.  Pt continues on NPO status. Pt with aphasia however able to nod yes or no. Per RN, pt with uncontrolled diarrhea. RD to modify tube feeding regimen and change formula to Osmolite 1.5 cal to aid in managing diarrhea. Possible plans for PEG placement tomorrow. Labs and medications reviewed.   Diet Order:   Diet Order             Diet NPO time specified  Diet effective now                   EDUCATION NEEDS:   Not  appropriate for education at this time  Skin:  Skin Assessment: Reviewed RN Assessment  Last BM:  7/20  Height:   Ht Readings from Last 1 Encounters:  08/10/20 5' 4"  (1.626 m)    Weight:   Wt Readings from Last 1 Encounters:  08/12/20 94.3 kg   BMI:  Body mass index is 35.68 kg/m.  Estimated Nutritional Needs:   Kcal:  1800-2000  Protein:  95-110 grams  Fluid:  >/=1.8 L/day  Corrin Parker, MS, RD, LDN RD pager number/after hours weekend pager number on Amion.

## 2020-08-12 NOTE — H&P (Addendum)
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 scalp electrodes positioned according to the 10-20 International system of electrode placement. Electrical activity was acquired at a sampling rate of 500Hz  and reviewed with a high frequency filter of 70Hz  and a low frequency filter of 1Hz . EEG data were recorded continuously and digitally stored. Description: No posterior dominant rhythm seen.  EEG showed continuous generalized and lateralized right hemisphere 3-5 Hz theta-delta slowing. Hyperventilation and photic stimulation were not peRight hemisphere.  rformed.   ABNORMALITY - Continuous slow, generalized and lateralized right hemisphere IMPRESSION: This study is suggestive of cortical dysfunction arising from right hemisphere, likely secondary to underlying structural abnormality/stroke. Additionally, there is moderate diffuse encephalopathy, nonspecific etiology. No seizures or epileptiform discharges were seen throughout the recording. If concern for ictal-interictal activity persists, consider long term monitoring. Lora Havens   ECHOCARDIOGRAM  COMPLETE  Result Date: 07/29/2020    ECHOCARDIOGRAM REPORT   Patient Name:   JEHAN RANGANATHAN Date of Exam: 07/29/2020 Medical Rec #:  [id number]       Height:       64.0 in Accession #:    [id number]      Weight:       216.0 lb Date of Birth:  1950/12/16       BSA:          2.022 m Patient Age:    65 years        BP:           137/125 mmHg Patient Gender: F               HR:  103 bpm. Exam Location:  Inpatient Procedure: 2D Echo, Color Doppler and Cardiac Doppler Indications:    Stroke  History:        Patient has no prior history of Echocardiogram examinations.                 Risk Factors:Morbid obesity and Hypertension. Stroke                 434.91/I163.9.  Sonographer:    Merrie Roof RDCS Referring Phys: [phone] MCNEILL P Manley  1. Left ventricular ejection fraction, by estimation, is 55 to 60%. The left ventricle has normal function. The left ventricle has no regional wall motion abnormalities. Left ventricular diastolic parameters are consistent with Grade I diastolic dysfunction (impaired relaxation).  2. Right ventricular systolic function is normal. The right ventricular size is normal. Tricuspid regurgitation signal is inadequate for assessing PA pressure.  3. Left atrial size was mildly dilated.  4. Right atrial size was mildly dilated.  5. The mitral valve is normal in structure. No evidence of mitral valve regurgitation. No evidence of mitral stenosis.  6. The aortic valve is tricuspid. Aortic valve regurgitation is not visualized. Mild aortic valve sclerosis is present, with no evidence of aortic valve stenosis.  7. The inferior vena cava is normal in size with greater than 50% respiratory variability, suggesting right atrial pressure of 3 mmHg. FINDINGS  Left Ventricle: Left ventricular ejection fraction, by estimation, is 55 to 60%. The left ventricle has normal function. The left ventricle has no regional wall motion abnormalities. The left ventricular internal cavity size was  normal in size. There is  no left ventricular hypertrophy. Left ventricular diastolic parameters are consistent with Grade I diastolic dysfunction (impaired relaxation). Right Ventricle: The right ventricular size is normal. No increase in right ventricular wall thickness. Right ventricular systolic function is normal. Tricuspid regurgitation signal is inadequate for assessing PA pressure. Left Atrium: Left atrial size was mildly dilated. Right Atrium: Right atrial size was mildly dilated. Pericardium: There is no evidence of pericardial effusion. Mitral Valve: The mitral valve is normal in structure. There is mild thickening of the mitral valve leaflet(s). No evidence of mitral valve regurgitation. No evidence of mitral valve stenosis. Tricuspid Valve: The tricuspid valve is normal in structure. Tricuspid valve regurgitation is not demonstrated. Aortic Valve: The aortic valve is tricuspid. Aortic valve regurgitation is not visualized. Mild aortic valve sclerosis is present, with no evidence of aortic valve stenosis. Aortic valve mean gradient measures 9.0 mmHg. Aortic valve peak gradient measures 16.8 mmHg. Aortic valve area, by VTI measures 1.47 cm. Pulmonic Valve: The pulmonic valve was normal in structure. Pulmonic valve regurgitation is trivial. Aorta: The aortic root is normal in size and structure. Venous: The inferior vena cava is normal in size with greater than 50% respiratory variability, suggesting right atrial pressure of 3 mmHg. IAS/Shunts: No atrial level shunt detected by color flow Doppler.  LEFT VENTRICLE PLAX 2D LVIDd:         4.10 cm  Diastology LVIDs:         3.00 cm  LV e' medial:    9.68 cm/s LV PW:         0.90 cm  LV E/e' medial:  9.9 LV IVS:        1.00 cm  LV e' lateral:   16.80 cm/s LVOT diam:     1.90 cm  LV E/e' lateral: 5.7 LV SV:         50  LV SV Index:   25 LVOT Area:     2.84 cm  RIGHT VENTRICLE RV Basal diam:  4.00 cm RV Mid diam:    3.50 cm LEFT ATRIUM             Index        RIGHT ATRIUM           Index LA diam:        3.20 cm 1.58 cm/m  RA Area:     19.40 cm LA Vol (A2C):   70.1 ml 34.68 ml/m RA Volume:   58.60 ml  28.99 ml/m LA Vol (A4C):   81.6 ml 40.36 ml/m LA Biplane Vol: 78.4 ml 38.78 ml/m  AORTIC VALVE AV Area (Vmax):    1.42 cm AV Area (Vmean):   1.43 cm AV Area (VTI):     1.47 cm AV Vmax:           205.00 cm/s AV Vmean:          141.000 cm/s AV VTI:            0.342 m AV Peak Grad:      16.8 mmHg AV Mean Grad:      9.0 mmHg LVOT Vmax:         103.00 cm/s LVOT Vmean:        71.300 cm/s LVOT VTI:          0.177 m LVOT/AV VTI ratio: 0.52  AORTA Ao Root diam: 3.00 cm Ao Asc diam:  3.30 cm MITRAL VALVE MV Area (PHT): 4.29 cm     SHUNTS MV Decel Time: 177 msec     Systemic VTI:  0.18 m MV E velocity: 95.90 cm/s   Systemic Diam: 1.90 cm MV A velocity: 136.00 cm/s MV E/A ratio:  0.71 Loralie Champagne MD Electronically signed by Loralie Champagne MD Signature Date/Time: 07/29/2020/4:23:49 PM    Final    IR PERCUTANEOUS ART THROMBECTOMY/INFUSION INTRACRANIAL INC DIAG ANGIO  Result Date: 07/30/2020 INDICATION: New onset left-sided weakness and left-sided neglect. Proximal occlusion of the superior division of the right middle cerebral artery. EXAM: 1. EMERGENT LARGE VESSEL OCCLUSION THROMBOLYSIS (anterior CIRCULATION) COMPARISON:  CT angiogram of the head and neck of 07/28/2020. MEDICATIONS: Ancef 2 g IV antibiotic was administered within 1 hour of the procedure. ANESTHESIA/SEDATION: General anesthesia. CONTRAST:  Isovue 300 approximately 90 mL. FLUOROSCOPY TIME:  Fluoroscopy Time: 22 minutes 24 seconds (2617 mGy). COMPLICATIONS: None immediate. TECHNIQUE: An emergent consent was obtained with 2 physicians as no next of kin, or family members were immediately available physically or via telephone. The patient was then put under general anesthesia by the Department of Anesthesiology at [facility]. The right groin was prepped and draped in the usual sterile fashion. Thereafter  using modified Seldinger technique, transfemoral access into the right common femoral artery was obtained without difficulty. Over a 0.035 inch guidewire an 8 French 25 cm Pinnacle sheath was inserted. Through this, and also over a 0.035 inch guidewire a combination of a 5.5 French 125 cm Berenstein catheter inside of an 087 balloon catheter was advanced to the aortic arch region and selectively positioned in the right common carotid artery. FINDINGS: The right common carotid arteriogram demonstrates the right external carotid artery and its major branches to be widely patent. The right internal carotid artery at the bulb to the cranial skull base is widely patent. The petrous, the cavernous and the supraclinoid segments are widely patent. The right anterior cerebral artery opacifies  into the capillary and venous phases. The right middle cerebral artery demonstrates patency of the right M1 segment and the inferior division. The superior division demonstrates the stump of an occluded superior division. Right posterior communicating artery transiently opacifies the right posterior cerebral artery distribution. PROCEDURE: The balloon guide was then advanced to the distal cervical right ICA over an 0.035 inch Roadrunner guidewire. The guidewire was removed. Good aspiration obtained from hub of the balloon catheter. A gentle control arteriogram demonstrated no evidence of spasms dissections or of intraluminal filling defects. Over a 0.014 inch standard Synchro micro guidewire, a combination of a 55 136 cm Zoom aspiration catheter inside of which was an 021 Trevo Trak microcatheter was advanced to the supraclinoid right ICA. Using a torque device, wire access was obtained into the right middle cerebral artery and superior division into the M2 M3 region followed by the microcatheter. The guidewire was removed. Good aspiration was obtained from the hub of the microcatheter. A gentle control arteriogram performed through the  microcatheter demonstrated brisk opacification distal to the microcatheter. A 4 mm x 40 mm Solitaire X retrieval device was then advanced to the distal end of the micro guidewire. The O ring on the delivery microcatheter was loosened. With slight forward gentle traction with the right hand on the delivery micro guidewire, with the left hand the delivery microcatheter was retrieved deploying the retrieval device. The Zoom aspiration catheter was then advanced to the origin of the superior division. With proximal flow arrest in the right internal carotid artery, and constant aspiration applied at the hub of the balloon guide catheter with a 20 mL syringe and at the hub of the Zoom aspiration catheter with Penumbra pump for approximately 2-1/2 minutes, the combination of the retrieval device, the microcatheter and the aspiration catheter were retrieved and removed. Following reversal of flow arrest, a control arteriogram performed through the balloon guide in the right internal carotid artery demonstrated opacification at the origin of the inferior superior division. However, there continued to be significant stenosis in the M2 superior division distally. At least 4 aliquots of nitroglycerin was then infused intra-arterially to relieve the suspected vasospasm. The vessel, however, occluded at the origin. A combination of a Zoom 55 aspiration catheter with an 021 156 cm 2 tip Headway microcatheter was then advanced over a 0.014 inch standard Synchro micro guidewire to the proximal right middle cerebral artery. The guidewire was then gently manipulated through the occluded superior division into the M2 M3 region followed by the microcatheter. The guidewire was removed. Slow aspiration was noted at the hub of the microcatheter. This improved with slight retraction of the microcatheter tip. Control arteriogram performed through the microcatheter demonstrated safe position of the tip of the microcatheter. This was then  connected to continuous heparinized saline infusion. A 3 mm x 40 mm Solitaire X retrieval device was then advanced to the distal end of the microcatheter. The retrieval device was deployed in the usual manner as described earlier. Proximal flow arrest was initiated, and the Zoom aspiration catheter was engaged at the origin of the inferior division. After 2-1/2 minutes, the combination of the retrieval device, the Zoom aspiration catheter and the microcatheter were retrieved and removed. Flow arrest was reversed. A control arteriogram performed through the balloon guide catheter in the distal right internal carotid artery now demonstrated complete revascularization of the superior division. A TICI 3 revascularization was achieved. The right anterior cerebral artery remained widely patent. Also the right posterior communicating artery remained widely  patent. A final control arteriogram performed through the balloon guide catheter in the right common carotid artery continued to demonstrate wide patency of the internal carotid artery extra cranially and intracranially with no change in the right middle and the right anterior cerebral artery distributions. Guide catheter were removed. The 8 French sheath was removed with hemostasis achieved at the right common femoral puncture site with an 8 French Angio-Seal closure device. The right groin appeared soft. Distal pulses remained Dopplerable in both feet. Post CT of the brain demonstrated no evidence of intracranial hemorrhage or mass effect or midline shift. Patient's general anesthesia was then reversed. Upon recovery, the patient was noted to move the right arm and leg spontaneously. She was just about able to bend the left knee to simple commands. However, there was no motion noted in the left upper extremity. Patient was then transferred to the neuro ICU for post revascularization care. IMPRESSION: Status post endovascular revascularization of proximally occluded  superior division of the right middle cerebral artery with 1 pass with a 4 mm x 40 mm Solitaire X retrieval device and contact aspiration, 1 pass with a 3 mm x 40 mm Solitaire X retrieval device with contact aspiration, achieving a TICI 3 revascularization. PLAN: Follow-up as per referring MD. Electronically Signed   By: Luanne Bras M.D.   On: 07/29/2020 12:30    Labs:  CBC: Recent Labs    08/03/20 0256 08/05/20 1620 08/06/20 0530 08/09/20 0837  WBC 11.2* 10.4 8.9 8.4  HGB 10.2* 10.1* 10.0* 10.0*  HCT 32.0* 33.0* 32.3* 31.7*  PLT 387 448* 472* 443*    COAGS: No results for input(s): INR, APTT in the last 8760 hours.  BMP: Recent Labs    08/02/20 0342 08/03/20 0256 08/05/20 1620 08/06/20 0530 08/09/20 0837  NA 141 142  --  141 140  K 4.4 4.3  --  4.4 4.2  CL 105 104  --  105 106  CO2 28 30  --  28 26  GLUCOSE 121* 130*  --  198* 232*  BUN 25* 27*  --  32* 30*  CALCIUM 9.3 9.7  --  9.3 9.5  CREATININE 1.04* 1.06* 1.28* 1.18* 1.05*  GFRNONAA 57* 56* 45* 49* 56*    LIVER FUNCTION TESTS: Recent Labs    07/28/20 1708 08/06/20 0530  BILITOT 0.7 0.5  AST 41 27  ALT 18 26  ALKPHOS 38 58  PROT 6.1* 6.5  ALBUMIN 3.1* 2.8*    TUMOR MARKERS: No results for input(s): AFPTM, CEA, CA199, CHROMGRNA in the last 8760 hours.  Assessment and Plan: 70 y.o. female with recent ischemic stroke, s/p mechanical thrombectomy with Dr. Estanislado Pandy on 07/30/2020.  Was placed on 325 ASA daily and discharged to CIR by neurology.  Patient showed persistent dysphagia after the stroke, underwent speech eval, progress is poor for rapid recovery of swallow per SLP.  Need of long-term feeding tube was discussed with patient and her husband by primary team, after thorough discussion and shared decision making, patient and her husband decided to proceed with a long-term feeding tube placement.  IR was requested for image guided gastrostomy tube placement. CT abdomen without contrast was ordered  for anatomy eval, anatomy amendable for G-tube placement by Dr. Dwaine Gale.  The procedure is tentatively scheduled for tomorrow pending IR schedule.  Stop tube feeding at midnight 08/13/2020, order placed. Can have morning meds via tube with small amount of water.  Ancef ordered. VSS CBC with differential on 08/09/2020 stable Hgb  10.0, PLT 443 INR on 07/01/2020 1.15 On subcu heparin every 8 hours -will need to be held for 4 to 6 hours prior to G-tube placement. Held 0600 hrs dose on 08/13/20. Please contact IR before administer 1400 hrs dose.   On ASA 325 mg daily, discussed with Dr. Anselm Pancoast and Dr. Serafina Royals as ASA requires 3-day hold for high bleeding risk procedures such as G-tube placement per IR anticoagulation protocol, no need to hold ASA to minimize recurrent intracranial thrombosis.   Patient nods when asked if she agrees to proceed.  Mr. Kearstyn Avitia was called to obtain consent as well.   Risks and benefits image guided gastrostomy tube placement was discussed with the patient and her husband including, but not limited to the need for a barium enema during the procedure, bleeding, infection, peritonitis and/or damage to adjacent structures.  All of the patient and her husband's questions were answered,both agreeable to proceed.  Consent signed and in IR PA office.    Thank you for this interesting consult.  I greatly enjoyed meeting SIANNE TEJADA and look forward to participating in their care.  A copy of this report was sent to the requesting provider on this date.  Electronically Signed: Tera Mater, PA-C 08/12/2020, 2:58 PM   I spent a total of 40 Minutes  in face to face in clinical consultation, greater than 50% of which was counseling/coordinating care for image guided gastrostomy tube placement.

## 2020-08-12 NOTE — Progress Notes (Signed)
Physical Therapy Session Note  Patient Details  Name: Jennifer Lucero MRN: [id number] Date of Birth: [birth year]  Today's Date: 08/12/2020 PT Individual Time: 7209-4709 PT Individual Time Calculation (min): 56 min   Short Term Goals: Week 1:  PT Short Term Goal 1 (Week 1): Pt will consistently complete rolling ModA PT Short Term Goal 2 (Week 1): Pt will consistently complete supine <> sit ModA PT Short Term Goal 3 (Week 1): Pt will ambulate 158ft MinA PT Short Term Goal 4 (Week 1): Pt will improve attention towards Left environment requiring cuing for attention <75% of the time PT Short Term Goal 5 (Week 1): Pt will complete stairs with MinA  Skilled Therapeutic Interventions/Progress Updates:  Patient seated in w/c on entrance to room with no briefs or pants and gown donned. Patient alert and agreeable to PT session. Patient denied pain during session. D/t new enteric precautions, pt assisted with donning of brief in order to leave room for therapy and extra gown for coverage.   Therapeutic Activity: Transfers: Patient performed STS and SPVT transfers throughout session with supervision. Provided verbal cues for taking time and awareness of cortrack tubing as well as IV pole for safe choices in turning/ pivoting.  Gait Training:  Patient ambulated 150' x2 with hold to IV pole to manage pt's gait speed and supervision for safety. Provided vc/ tc for stepping placement with IV pole.  Neuromuscular Re-ed: NMR facilitated during session with focus on standing balance, following instructions, cognition. Pt guided in standing performance of peg placement in board in order to recreate picture self chosen by pt. Pt requires vc/ tc throughout for placing correct end into board, attempting to place correct colors in correct placements to recreate picture with pt unable to choose requested color with 4 colors placed outside of board. Unable to determine correct placement, undershoots attempts to place  into hole, and motor perseveration of continuous twisting into hole placement.   Standing and dynamic stepping attempted forward and laterally through agility ladder with pt unable to complete and just walking through ladder. Requires visual demonstration simultaneously and repeated attempts to correctly ambulate laterally but continues to step with no regard to rungs or squares. NMR performed for improvements in motor control and coordination, balance, sequencing, judgement, and self confidence/ efficacy in performing all aspects of mobility at highest level of independence.   Patient seated in w/c room after doffing brief and replacing chuck pad in w/c seat at end of session. Brakes locked, belt alarm set, and all needs within reach on tray table in front of pt. Call button wrapped to armrest and placed on tray table. Pt indicates safety mitt to R hand and informed that she does not have to wear safety mitt at this time.      Therapy Documentation Precautions:  Precautions Precautions: Fall Precaution Comments: Cortrak, global aphasia, L below elbow amputation hx, L inattention Restrictions Weight Bearing Restrictions: No  Therapy/Group: Individual Therapy  Alger Simons PT, DPT 08/12/2020, 5:08 PM

## 2020-08-13 ENCOUNTER — Inpatient Hospital Stay (HOSPITAL_COMMUNITY): Payer: Medicare Other

## 2020-08-13 DIAGNOSIS — I63511 Cerebral infarction due to unspecified occlusion or stenosis of right middle cerebral artery: Secondary | ICD-10-CM | POA: Diagnosis not present

## 2020-08-13 HISTORY — PX: IR GASTROSTOMY TUBE MOD SED: IMG625

## 2020-08-13 LAB — GLUCOSE, CAPILLARY
Glucose-Capillary: 115 mg/dL — ABNORMAL HIGH (ref 70–99)
Glucose-Capillary: 138 mg/dL — ABNORMAL HIGH (ref 70–99)
Glucose-Capillary: 161 mg/dL — ABNORMAL HIGH (ref 70–99)
Glucose-Capillary: 178 mg/dL — ABNORMAL HIGH (ref 70–99)
Glucose-Capillary: 236 mg/dL — ABNORMAL HIGH (ref 70–99)

## 2020-08-13 MED ORDER — FENTANYL CITRATE (PF) 100 MCG/2ML IJ SOLN
INTRAMUSCULAR | Status: AC | PRN
  Administered 2020-08-13: 50 ug via INTRAVENOUS

## 2020-08-13 MED ORDER — LIDOCAINE HCL (PF) 1 % IJ SOLN
INTRAMUSCULAR | Status: AC | PRN
  Administered 2020-08-13: 5 mL

## 2020-08-13 MED ORDER — GLUCAGON HCL RDNA (DIAGNOSTIC) 1 MG IJ SOLR
INTRAMUSCULAR | Status: AC
  Filled 2020-08-13: qty 1

## 2020-08-13 MED ORDER — IOHEXOL 300 MG/ML  SOLN
50.0000 mL | Freq: Once | INTRAMUSCULAR | Status: AC | PRN
  Administered 2020-08-13: 25 mL

## 2020-08-13 MED ORDER — MIDAZOLAM HCL 2 MG/2ML IJ SOLN
INTRAMUSCULAR | Status: AC
  Filled 2020-08-13: qty 2

## 2020-08-13 MED ORDER — MIDAZOLAM HCL 2 MG/2ML IJ SOLN
INTRAMUSCULAR | Status: AC | PRN
  Administered 2020-08-13: 1 mg via INTRAVENOUS

## 2020-08-13 MED ORDER — SODIUM CHLORIDE 0.9 % IV SOLN
INTRAVENOUS | Status: AC | PRN
  Administered 2020-08-13: 10 mL/h via INTRAVENOUS

## 2020-08-13 MED ORDER — LIDOCAINE HCL 1 % IJ SOLN
INTRAMUSCULAR | Status: AC
  Filled 2020-08-13: qty 20

## 2020-08-13 MED ORDER — CEFAZOLIN SODIUM-DEXTROSE 2-4 GM/100ML-% IV SOLN
INTRAVENOUS | Status: AC
  Administered 2020-08-13: 2000 mg
  Filled 2020-08-13: qty 100

## 2020-08-13 MED ORDER — FENTANYL CITRATE (PF) 100 MCG/2ML IJ SOLN
INTRAMUSCULAR | Status: AC
  Filled 2020-08-13: qty 2

## 2020-08-13 NOTE — Progress Notes (Signed)
Pt arrived back from IR. Vital signs done every 30 minx4, pt has no complaints of nausea, vomiting, abd distention. Slight pain and soreness around new peg site.no redness or swelling at tube site. First dose of medication given via pegtube with 20 mL flush no complications, or any pain/issues from pt while medications and flush being given. Given '650mg'$  of tylenol for soreness.  Dayna Ramus

## 2020-08-13 NOTE — Progress Notes (Signed)
PROGRESS NOTE   Subjective/Complaints: Appreciate IR note, , will hold ASA until after procedure  May be going tomorrow for PEG  ROS- severe exp aphasia Objective:   CT ABDOMEN WO CONTRAST  Result Date: 08/11/2020 CLINICAL DATA:  Gastrostomy status EXAM: CT ABDOMEN WITHOUT CONTRAST TECHNIQUE: Multidetector CT imaging of the abdomen was performed following the standard protocol without IV contrast. COMPARISON:  08/26/2004 FINDINGS: Lower chest: No acute abnormality. Hepatobiliary: No focal liver abnormality is seen. No gallstones, gallbladder wall thickening, or biliary dilatation. Pancreas: Unremarkable. No pancreatic ductal dilatation or surrounding inflammatory changes. Spleen: Normal in size without focal abnormality. Adrenals/Urinary Tract: Adrenal glands are unremarkable. Kidneys are normal, without renal calculi, focal lesion, or hydronephrosis. Stomach/Bowel: Enteric tube terminates in the gastric lumen. Anatomy is appropriate for percutaneous gastrostomy tube placement. No dilated loops of bowel identified within the visualized abdomen. Vascular/Lymphatic: No enlarged abdominal lymph nodes. Other: No abdominal wall hernia or abnormality. Musculoskeletal: No acute or significant osseous findings. IMPRESSION: Anatomy appropriate for percutaneous G-tube placement. Electronically Signed   By: Miachel Roux M.D.   On: 08/11/2020 16:25   No results for input(s): WBC, HGB, HCT, PLT in the last 72 hours.  No results for input(s): NA, K, CL, CO2, GLUCOSE, BUN, CREATININE, CALCIUM in the last 72 hours.   Intake/Output Summary (Last 24 hours) at 08/13/2020 0901 Last data filed at 08/12/2020 1707 Gross per 24 hour  Intake 250 ml  Output --  Net 250 ml         Physical Exam: Vital Signs Blood pressure (!) 114/51, pulse 91, temperature 98.2 F (36.8 C), temperature source Oral, resp. rate 18, height '5\' 4"'$  (1.626 m), weight 94 kg, SpO2 94  %.  HEENT- cortrak General: No acute distress Mood and affect are appropriate Heart: Regular rate and rhythm no rubs murmurs or extra sounds Lungs: Clear to auscultation, breathing unlabored, no rales or wheezes Abdomen: Positive bowel sounds, soft nontender to palpation, nondistended Extremities: No clubbing, cyanosis, or edema Skin: No evidence of breakdown, no evidence of rash   Neurologic:  motor strength is 5/5 in right and 4/5 left deltoid, bicep, tricep, grip, hip flexor, knee extensors, ankle dorsiflexor and plantar flexor  Musculoskeletal: Full range of motion in all 4 extremities, no pain with upper extremity or lower extremity range of motion.. No joint swelling Status post left hand amputation, chronic   Assessment/Plan: 1. Functional deficits which require 3+ hours per day of interdisciplinary therapy in a comprehensive inpatient rehab setting. Physiatrist is providing close team supervision and 24 hour management of active medical problems listed below. Physiatrist and rehab team continue to assess barriers to discharge/monitor patient progress toward functional and medical goals  Care Tool:  Bathing    Body parts bathed by patient: Right arm, Left arm, Chest, Abdomen, Front perineal area, Right upper leg, Left upper leg   Body parts bathed by helper: Buttocks, Right lower leg, Left lower leg     Bathing assist Assist Level: Minimal Assistance - Patient > 75%     Upper Body Dressing/Undressing Upper body dressing   What is the patient wearing?: Pull over shirt    Upper body assist Assist Level: Minimal  Assistance - Patient > 75%    Lower Body Dressing/Undressing Lower body dressing      What is the patient wearing?: Pants     Lower body assist Assist for lower body dressing: Minimal Assistance - Patient > 75%     Toileting Toileting    Toileting assist Assist for toileting: Contact Guard/Touching assist     Transfers Chair/bed  transfer  Transfers assist     Chair/bed transfer assist level: Contact Guard/Touching assist     Locomotion Ambulation   Ambulation assist      Assist level: Minimal Assistance - Patient > 75% Assistive device: No Device Max distance: 5'   Walk 10 feet activity   Assist     Assist level: Moderate Assistance - Patient - 50 - 74% (+2 wc follow) Assistive device: Hand held assist   Walk 50 feet activity   Assist    Assist level: Moderate Assistance - Patient - 50 - 74% Assistive device: Hand held assist    Walk 150 feet activity   Assist Walk 150 feet activity did not occur: Safety/medical concerns         Walk 10 feet on uneven surface  activity   Assist Walk 10 feet on uneven surfaces activity did not occur: Safety/medical concerns         Wheelchair     Assist Will patient use wheelchair at discharge?: No      Wheelchair assist level: Moderate Assistance - Patient 50 - 74% Max wheelchair distance: 38f    Wheelchair 50 feet with 2 turns activity    Assist    Wheelchair 50 feet with 2 turns activity did not occur: Safety/medical concerns   Assist Level: Maximal Assistance - Patient 25 - 49% (maxA for two turns)   Wheelchair 150 feet activity     Assist  Wheelchair 150 feet activity did not occur: Safety/medical concerns       Blood pressure (!) 114/51, pulse 91, temperature 98.2 F (36.8 C), temperature source Oral, resp. rate 18, height '5\' 4"'$  (1.626 m), weight 94 kg, SpO2 94 %.   Medical Problem List and Plan: 1.  Side weakness with aphasia/dysphagia secondary to right MCA infarction secondary to right M2 occlusion status post mechanical thrombectomy             -patient may shower once Tfs aren't continuous             -ELOS/Goals: 2.5-3 weeks supervision to min A-   -restraints required since pulled Feeding tube out- getting OOB which is very unsafe.   -con't PT, OT and SLP- will stop waist restraint and use Posey  and mitten if needed- and telesitter to stop restraints.  2.  Antithrombotics: -DVT/anticoagulation: Subcutaneous heparin             -antiplatelet therapy: Aspirin 325 mg daily.  Consider to resume Eliquis 10 to 14 days given large infarct with hemorrhagic transformation as noted per neurology services. 3. Pain Management: Tylenol as needed 4. Mood: Provide emotional support             -antipsychotic agents: Seroquel 25 mg nightly  7/17- sleeping OK per staff- con't regimen 5. Neuropsych: This patient is not capable of making decisions on her own behalf. 6. Skin/WounModerate d Care: Routine skin checks  7/17- MASD- will con't Gerhardt's and cream- received 3 doses of  IV Diflucan with much less redness, will d/c 7. Fluids/Electrolytes/Nutrition: Routine in and outs with follow-up chemistries 8.  Seizure prophylaxis.  EEG negative.  Keppra 500 mg twice daily. 9.  Dysphagia.  NPO.  Alternative means of nutritional support.  Dietary follow-up- con't Cortrak for now-consult IR for  PEG.  needs R mitten  as well as soft waist restraint 10.  Atrial fibrillation.  Lopressor 25 mg twice daily.  Await plans to resume Eliquis 11.  Hypertension.  Isordil 60 mg twice daily, lisinopril 20 mg daily.  Monitor with increased mobility 12.  Diabetes mellitus.  Hemoglobin A1c 9.6.  NovoLog 2 units every 4 hours, Lantus insulin 20 units daily.  Check blood sugars before meals and at bedtime CBG (last 3)  Recent Labs    08/12/20 2024 08/13/20 0020 08/13/20 0442  GLUCAP 176* 236* 115*   Improved cont novalog  3U, cont current meds  13.  Hyperlipidemia.  Pravachol 14. Expressive aphasia- needs communication board. 15. Urinary incontinence- having since stroke-likely urgency plus communication issues 16.  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 RUE skin tear- skin care being done- locally 18.  Loose incont stools - no recent abx abd exam unremarkable , may be TF related +/- meds (pravastatin or  Diflucan) will stop diflucan as cutaneous yeast infx cleared   Check stool for C diff if not allowed by protocol will start immodium, presume this is TF related LOS: 8 days A FACE TO FACE EVALUATION WAS PERFORMED  Charlett Blake 08/13/2020, 9:01 AM

## 2020-08-13 NOTE — Progress Notes (Signed)
Occupational Therapy Weekly Progress Note  Patient Details  Name: Jennifer Lucero MRN: [id number] Date of Birth: Jan 06, 1951  Beginning of progress report period: August 07, 2020 End of progress report period: August 13, 2020  Today's Date: 08/13/2020 OT Missed Time: 68 Minutes Missed Time Reason: Patient fatigue   Patient has met 4 of 4 short term goals.  Pt has made steady progress this week in OT. Pt has demonstrated improved dynamic standing balance + activity tolerance + command follow to presently complete UBD at min A level, grooming at min A level, LBD/toileting at min to mod A level, and ambulatory toilet transfers at [facility] level. Pt cont to be primarily limited by L inattention, decreased receptive/expressive language deficits, and recently frequent episodes of incontinent loose stools impacting ability to participate fully in therapy. LUE currently assessed at [place] level IV. Presently, anticipate 24/7 S and CGA to min physical assist for ADL required upon DC.   Patient continues to demonstrate the following deficits: muscle weakness, decreased cardiorespiratoy endurance, impaired timing and sequencing, unbalanced muscle activation, decreased coordination, and decreased motor planning, decreased attention to left, decreased motor planning, and ideational apraxia, decreased initiation, decreased attention, decreased awareness, decreased problem solving, decreased safety awareness, decreased memory, and delayed processing, and decreased standing balance, hemiplegia, and decreased balance strategies and therefore will continue to benefit from skilled OT intervention to enhance overall performance with BADL and Reduce care partner burden.  Patient progressing toward long term goals..  Continue plan of care.  OT Short Term Goals Week 2:  OT Short Term Goal 1 (Week 2): STG = LTG 2/2 ELOS  Skilled Therapeutic Interventions/Progress Updates:    Pt received asleep in bed. Unable to rouse  despite max verbal and tactile cues. Pt left as found. Pt missed 45 min of scheduled OT.    Therapy Documentation Precautions:  Precautions Precautions: Fall Precaution Comments: Cortrak, global aphasia, L below elbow amputation hx, L inattention Restrictions Weight Bearing Restrictions: No  Pain:  No  s/sx ADL: See Care Tool for more details.   [name] MS, OTR/L  08/13/2020, 6:45 AM

## 2020-08-13 NOTE — Progress Notes (Signed)
Held morning meds per IR request due to morning procedure.   Milford Cage LPN

## 2020-08-13 NOTE — Procedures (Signed)
Interventional Radiology Procedure:   Indications: Dysphagia  Procedure: Gastrostomy tube placement  Findings: 20 Fr tube in stomach  Complications: none     EBL: less than 10 ml  Plan: Plan to start feeds on 7/23   Rolland Steinert R. Anselm Pancoast, MD  Pager: [phone]

## 2020-08-13 NOTE — Progress Notes (Signed)
Occupational Therapy Session Note  Patient Details  Name: Jennifer Lucero MRN: [id number] Date of Birth: [birth year]  Today's Date: 08/13/2020 OT Individual Time: 0930-1030 OT Individual Time Calculation (min): 60 min    Short Term Goals: Week 2:  OT Short Term Goal 1 (Week 2): STG = LTG 2/2 ELOS  Skilled Therapeutic Interventions/Progress Updates: Patient received in bed - incontinent of loose stool. Spoke to Nursing Staff as patient is scheduled for Peg tube placement later today - reports no use of paper brief due to patients skin condition and frequent uncontrolled loose stools.   Cleaned patient and applied Desitin.   Patient assisted to seated edge of bed and transferred to wheelchair for bathing at sink.  Patient able to stand full minute with contact guard to close supervision.  Patient uses left residual limb effectively for opening soap bottle.  Patient needed several cues to don one leg into each pant leg - vs two in one pant leg.  Patient's husband arrived at end of session, and plans to bring clothing in for her before the end of the day.  Patient preferred to stay up in wheelchair.   Patient left up in wheelchair with personal items/call bell in reach safety /alarm belt in place.         Therapy Documentation Precautions:  Precautions Precautions: Fall Precaution Comments: Cortrak, global aphasia, L below elbow amputation hx, L inattention Restrictions Weight Bearing Restrictions: No   Pain: Pain Assessment Pain Scale: 0-10 Pain Score: 0-No pain Faces Pain Scale: No hurt    Therapy/Group: Individual Therapy  Mariah Milling 08/13/2020, 2:32 PM

## 2020-08-13 NOTE — Progress Notes (Signed)
Speech Language Pathology Weekly Progress and Session Note  Patient Details  Name: Jennifer Lucero MRN: [id number] Date of Birth: August 20, 1950  Beginning of progress report period: August 06, 2020 End of progress report period: August 13, 2020  Today's Date: 08/13/2020 SLP Individual Time: 2536-6440 SLP Individual Time Calculation (min): 41 min  Short Term Goals: Week 1: SLP Short Term Goal 1 (Week 1): Patient will consume ice chips trials with minimal overt s/sx of aspiration to indicate readiness to participate in instrumental swallow assessment (MBS). SLP Short Term Goal 1 - Progress (Week 1): Not met SLP Short Term Goal 2 (Week 1): Patient will use gestures/pointing to communicate basic wants and needs with mod A verbal/visual cues SLP Short Term Goal 2 - Progress (Week 1): Met SLP Short Term Goal 3 (Week 1): Patient will identify objects within a visual field of 3 with 50% accuracy and max A verbal/visual cues SLP Short Term Goal 3 - Progress (Week 1): Met SLP Short Term Goal 4 (Week 1): Patient will exhibit meaningful vocalization during structured speech/language tasks (e.g., automatic speech) with max verbal/visual/tactile cues. SLP Short Term Goal 4 - Progress (Week 1): Met SLP Short Term Goal 5 (Week 1): Following a visual model, patient will produce 10 repetitions of the oral motor movement with max verbal/visual/tactile cues. SLP Short Term Goal 5 - Progress (Week 1): Not met    New Short Term Goals: Week 2: SLP Short Term Goal 1 (Week 2): ST=LTGs due to ELOS  Weekly Progress Updates: Patient has met 3 of 5 short term goals. Patient has made slow progress with ST over the past week. Patient is currently completing communication tasks with mod-to-max A multimodal cues overall. 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 Patient can elicit vocalizations voluntarily during unstructured speech tasks to get  staff attention, and communicated functional needs via nodding her head yes/no in response to basic questions, pointing, and using gestures which is effective ~75% of the time. She has been known to elicit intentional vocalizations during structured speech tasks during 10-25% of the time with max A including verbal (intoning), visual (mouth cues), and tactile (hand tapping) cueing via modified melodic intonation. Patient has been unable to utilize communications boards due to severe reading deficits and suspected left visual inattention. Patient also displays difficulty processing pictures and colors further impacting use of communication aids. From a receptive standpoint, patient responds to yes/no questions with approximately 50% accuracy to basic wants/needs and biographical questions. She is able to follow simple 1-step directions with verbal/visual model and repetition, however breakdown occurs with 2+ step and complex directions. Patient is not progressing toward swallowing goals. 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 She is unable to perform volitional oral motor movements thus restricting ability to functionally manipulate bolus for participation of therapeutic PO trials. Patient remains NPO with recommendations for long-term source of nutrition. Initiated clean oral swabs dipped in water for pleasure as of 7/21. Spouse has been present and receptive to SLP education x2 tx. Recommend additional training prior to discharge. Recommend continued skilled ST intervention to maximize speech and swallow function and functional independence prior to discharge. Continue progression toward established LTGs set at mod assist level overall.  Intensity: Minumum of 1-2 x/day, 30 to 90 minutes Frequency: 3 to 5 out of 7 days Duration/Length of Stay: 7/29 Treatment/Interventions: Environmental controls;Internal/external aids;Multimodal  communication approach;Oral motor exercises;Speech/Language facilitation;Cueing  hierarchy;Dysphagia/aspiration precaution training;Functional tasks;Patient/family education   Daily Session  Skilled Therapeutic Interventions: Patient agreeable to skilled ST intervention with focus on communication, swallowing, and family education with spouse. Spouse demonstrated carry over of education about NPO status from previous session. SLP facilitated oral care with mod A. Some oral defensiveness noted with oral sponge with continued difficulty with voluntary oral motor movements. Set-up clean oral swab dipped in water for pleasure. Patient and spouse were extensively educated that patient must perform oral care and use clean swab prior to use. Educated spouse on oral care techniques using oral sponge or toothbrush. Provided with multiple oral sponges for home use. Spouse demonstrated understanding through teach back. Patient observed swallowing residuals from oral sponge with lingual pumping and no overt s/sx of aspiration with tongue in protruded position Patient unable to execute oral motor movements on this date despite max multimodal cues. Facilitated object identification using objects from [employer]. Patient identified objects within field of 4 with 50% accuracy with min A verbal/visual cues, progressing to 57% accuracy with mod A verbal/visual cues (field of 2 choices). Patient identified objects by function within file of 4 with 40% accuracy with min A verbal/visual cues progressing to 46% accuracy with mod A verbal/visual cues (field of 2 choices). Spouse was educated on communication strategies and able to teach back at end of session. Further education recommended for carryover. Patient was left in wheelchair with alarm activated and immediate needs within reach at end of session. Continue per current plan of care.       General    Pain Pain Assessment Pain Scale: 0-10 Pain Score: 0-No pain Faces Pain  Scale: No hurt  Therapy/Group: Individual Therapy  Patty Sermons 08/13/2020, 4:37 PM

## 2020-08-13 NOTE — Progress Notes (Signed)
Slept well throughout the night. Loose stools have decreased, incontinent care proved with rounding. Tube feeding stopped at midnight. No heparin given since 2200 dose.

## 2020-08-13 NOTE — Progress Notes (Signed)
Physical Therapy Note  Patient Details  Name: Jennifer Lucero MRN: [id number] Date of Birth: 1950-05-08 Today's Date: 08/13/2020    Pt just returned from IR and now on bed rest per MD order 2/2 feeding tube placement.  Ptmissed 52' of treatment time.   Jennifer Lucero 08/13/2020, 2:13 PM

## 2020-08-14 LAB — GLUCOSE, CAPILLARY
Glucose-Capillary: 219 mg/dL — ABNORMAL HIGH (ref 70–99)
Glucose-Capillary: 128 mg/dL — ABNORMAL HIGH (ref 70–99)
Glucose-Capillary: 308 mg/dL — ABNORMAL HIGH (ref 70–99)
Glucose-Capillary: 230 mg/dL — ABNORMAL HIGH (ref 70–99)
Glucose-Capillary: 292 mg/dL — ABNORMAL HIGH (ref 70–99)
Glucose-Capillary: 286 mg/dL — ABNORMAL HIGH (ref 70–99)

## 2020-08-14 MED ORDER — CALCIUM CARBONATE ANTACID 500 MG PO CHEW
1.0000 | CHEWABLE_TABLET | Freq: Four times a day (QID) | ORAL | Status: DC | PRN
  Administered 2020-08-14: 200 mg
  Filled 2020-08-14: qty 1

## 2020-08-14 NOTE — Progress Notes (Signed)
Jennifer Lucero is a 70 y.o. female s/p image guided gastrostomy tube placement with Dr. Anselm Pancoast yesterday.   G tube site appears clean, dry, no bleeding, no sign of acute infection.  Patient reports mild soreness around the G tube site.   Ok to use the gastrostomy tube starting around 1:30 pm today.   Please call IR for questions and concerns regarding the G tube.    Tera Mater PA-C 08/14/2020 12:39 PM

## 2020-08-14 NOTE — Progress Notes (Signed)
PROGRESS NOTE   Subjective/Complaints:  G tube placed without issues has not started feeds yet.  Husband at bedside  ROS- severe exp aphasia Objective:   IR GASTROSTOMY TUBE MOD SED  Result Date: 08/13/2020 INDICATION: 70 year old with history of CVA and dysphagia. Dysphagia and poor oral intake. EXAM: PERCUTANEOUS GASTROSTOMY TUBE WITH FLUOROSCOPIC GUIDANCE Physician: Stephan Minister. Anselm Pancoast, MD MEDICATIONS: Ancef 2 g; Antibiotics were administered within 1 hour of the procedure. Glucagon 1 mg IV ANESTHESIA/SEDATION: Versed 1.0 mg IV; Fentanyl 50 mcg IV Moderate Sedation Time:  15 The patient was continuously monitored during the procedure by the interventional radiology nurse under my direct supervision. FLUOROSCOPY TIME:  Fluoroscopy Time: 2.36 minutes, 36 mGy COMPLICATIONS: None immediate. PROCEDURE: Informed consent was obtained for a percutaneous gastrostomy tube. The patient was placed on the interventional table. An orogastric tube was placed with fluoroscopic guidance. The existing nasogastric tube was removed. The anterior abdomen was prepped and draped in sterile fashion. Maximal barrier sterile technique was utilized including caps, mask, sterile gowns, sterile gloves, sterile drape, hand hygiene and skin antiseptic. Stomach was inflated with air through the orogastric tube. The skin and subcutaneous tissues were anesthetized with 1% lidocaine. A 17 gauge needle was directed into the distended stomach with fluoroscopic guidance. A wire was advanced into the stomach and a T-tact was deployed. A 9-French vascular sheath was placed and the orogastric tube was snared using a Gooseneck snare device. The orogastric tube and snare were pulled out of the patient's mouth. The snare device was connected to a 20-French gastrostomy tube. The snare device and gastrostomy tube were pulled through the patient's mouth and out the anterior abdominal wall. The  gastrostomy tube was cut to an appropriate length. Contrast injection through gastrostomy tube confirmed placement within the stomach. Fluoroscopic images were obtained for documentation. The gastrostomy tube was flushed with normal saline. IMPRESSION: Successful fluoroscopic guided percutaneous gastrostomy tube placement. Electronically Signed   By: Markus Daft M.D.   On: 08/13/2020 14:25   No results for input(s): WBC, HGB, HCT, PLT in the last 72 hours.  No results for input(s): NA, K, CL, CO2, GLUCOSE, BUN, CREATININE, CALCIUM in the last 72 hours.  No intake or output data in the 24 hours ending 08/14/20 1214       Physical Exam: Vital Signs Blood pressure (!) 132/50, pulse 99, temperature 98.4 F (36.9 C), temperature source Oral, resp. rate 17, height '5\' 4"'$  (1.626 m), weight 94.8 kg, SpO2 92 %.  General: No acute distress Mood and affect are appropriate Heart: Regular rate and rhythm no rubs murmurs or extra sounds Lungs: Clear to auscultation, breathing unlabored, no rales or wheezes Abdomen: Positive bowel sounds, soft nontender to palpation, nondistended Extremities: No clubbing, cyanosis, or edema Skin: No evidence of breakdown, no evidence of rash  Neurologic:  motor strength is 5/5 in right and 4/5 left deltoid, bicep, tricep, grip, hip flexor, knee extensors, ankle dorsiflexor and plantar flexor  Musculoskeletal: Full range of motion in all 4 extremities, no pain with upper extremity or lower extremity range of motion.. No joint swelling Status post left hand amputation, chronic   Assessment/Plan: 1. Functional deficits which require 3+ hours  per day of interdisciplinary therapy in a comprehensive inpatient rehab setting. Physiatrist is providing close team supervision and 24 hour management of active medical problems listed below. Physiatrist and rehab team continue to assess barriers to discharge/monitor patient progress toward functional and medical goals  Care  Tool:  Bathing    Body parts bathed by patient: Left arm, Chest, Abdomen, Right upper leg, Left upper leg, Right lower leg, Left lower leg, Face   Body parts bathed by helper: Left arm, Front perineal area, Buttocks     Bathing assist Assist Level: Moderate Assistance - Patient 50 - 74%     Upper Body Dressing/Undressing Upper body dressing   What is the patient wearing?: Hospital gown only    Upper body assist Assist Level: Minimal Assistance - Patient > 75%    Lower Body Dressing/Undressing Lower body dressing      What is the patient wearing?: Pants     Lower body assist Assist for lower body dressing: Minimal Assistance - Patient > 75%     Toileting Toileting    Toileting assist Assist for toileting: Dependent - Patient 0%     Transfers Chair/bed transfer  Transfers assist     Chair/bed transfer assist level: Contact Guard/Touching assist     Locomotion Ambulation   Ambulation assist      Assist level: Minimal Assistance - Patient > 75% Assistive device: No Device Max distance: 5'   Walk 10 feet activity   Assist     Assist level: Moderate Assistance - Patient - 50 - 74% (+2 wc follow) Assistive device: Hand held assist   Walk 50 feet activity   Assist    Assist level: Moderate Assistance - Patient - 50 - 74% Assistive device: Hand held assist    Walk 150 feet activity   Assist Walk 150 feet activity did not occur: Safety/medical concerns         Walk 10 feet on uneven surface  activity   Assist Walk 10 feet on uneven surfaces activity did not occur: Safety/medical concerns         Wheelchair     Assist Will patient use wheelchair at discharge?: No      Wheelchair assist level: Moderate Assistance - Patient 50 - 74% Max wheelchair distance: 52f    Wheelchair 50 feet with 2 turns activity    Assist    Wheelchair 50 feet with 2 turns activity did not occur: Safety/medical concerns   Assist Level:  Maximal Assistance - Patient 25 - 49% (maxA for two turns)   Wheelchair 150 feet activity     Assist  Wheelchair 150 feet activity did not occur: Safety/medical concerns       Blood pressure (!) 132/50, pulse 99, temperature 98.4 F (36.9 C), temperature source Oral, resp. rate 17, height '5\' 4"'$  (1.626 m), weight 94.8 kg, SpO2 92 %.   Medical Problem List and Plan: 1.  Side weakness with aphasia/dysphagia secondary to right MCA infarction secondary to right M2 occlusion status post mechanical thrombectomy             -patient may shower Now that PEG in place              -ELOS/Goals: 2.5-3 weeks supervision to min A-   -restraints required since pulled Feeding tube out- getting OOB which is very unsafe.   -con't PT, OT and SLP- will stop waist restraint and use Posey and mitten if needed- and telesitter to stop restraints.  2.  Antithrombotics: -DVT/anticoagulation: Subcutaneous heparin             -antiplatelet therapy: Aspirin 325 mg daily.  Consider to resume Eliquis 10 to 14 days given large infarct with hemorrhagic transformation as noted per neurology services. 3. Pain Management: Tylenol as needed 4. Mood: Provide emotional support             -antipsychotic agents: Seroquel 25 mg nightly  7/17- sleeping OK per staff- con't regimen 5. Neuropsych: This patient is not capable of making decisions on her own behalf. 6. Skin/WounModerate d Care: Routine skin checks  7/17- MASD- will con't Gerhardt's and cream- received 3 doses of  IV Diflucan with much less redness, will d/c 7. Fluids/Electrolytes/Nutrition: Routine in and outs with follow-up chemistries 8.  Seizure prophylaxis.  EEG negative.  Keppra 500 mg twice daily. 9.  Dysphagia.  NPO.  Alternative means of nutritional support. S/P PEG, consult dietary for bolus feed recs 10.  Atrial fibrillation.  Lopressor 25 mg twice daily.  Await plans to resume Eliquis 11.  Hypertension.  Isordil 60 mg twice daily, lisinopril 20 mg  daily.  Monitor with increased mobility 12.  Diabetes mellitus.  Hemoglobin A1c 9.6.  NovoLog 2 units every 4 hours, Lantus insulin 20 units daily.  Check blood sugars before meals and at bedtime CBG (last 3)  Recent Labs    08/14/20 0059 08/14/20 0503 08/14/20 0908  GLUCAP 219* 128* 308*   Improved cont novalog  3U, will change to TID ac now that bolus feeds are starting  13.  Hyperlipidemia.  Pravachol 14. Expressive aphasia- needs communication board. 15. Urinary incontinence- having since stroke-likely urgency plus communication issues 16.  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 RUE skin tear- skin care being done- locally 18.  Loose incont stools - no recent abx abd exam unremarkable , likelyTF related +/- meds ?pravastatin    LOS: 9 days A FACE TO FACE EVALUATION WAS PERFORMED  Charlett Blake 08/14/2020, 12:14 PM

## 2020-08-14 NOTE — Progress Notes (Signed)
Physical Therapy Session Note  Patient Details  Name: Jennifer Lucero MRN: [id number] Date of Birth: 05-04-1950  Today's Date: 08/14/2020 PT Individual Time: 0802-0855 PT Individual Time Calculation (min): 53 min   Short Term Goals: Week 1:  PT Short Term Goal 1 (Week 1): Pt will consistently complete rolling ModA PT Short Term Goal 2 (Week 1): Pt will consistently complete supine <> sit ModA PT Short Term Goal 3 (Week 1): Pt will ambulate 153f MinA PT Short Term Goal 4 (Week 1): Pt will improve attention towards Left environment requiring cuing for attention <75% of the time PT Short Term Goal 5 (Week 1): Pt will complete stairs with MinA   Skilled Therapeutic Interventions/Progress Updates:   Pt received sitting in WC and agreeable to PT. Pt noted to have mild discomfort at site of Peg tub placement. RN notified and Medication administered. PT assisted pt to don pants with max assist for time and pain management.   Pt transported to rehab gym. Gait training with CGA x 559fwithout AD. Noted to have mild L hip instability. PT provided pt with SPC. Additional gait training x  [id number] supervision assist-CGA for clothing management. Noted to have increased cadence and appropriate weight shift with SPC compared to  no AD.  Dynamic gait training to step over 1inch obstacles on floor fo shuffle board pole, 2 x 4 with CGA from PT and min cues for improved step length on the LLE.   Dynamic balance training and visual scanning L to reach for clothes pins on horizontal rail x 10 in sitting and x 10 in standing on level surface.  Performed stangin balance on airex pad while attempting peg board puzzle. Unable to follow reference picture, but with min assist able to place 7 pegs on board with use of RUE.   Throughout session pt performed sit<>stand with increased time due to pain on peg tube site and supervision assist x 12   Patient returned to room and [facility]  bell in reach and all needs met.         Therapy Documentation Precautions:  Precautions Precautions: Fall Precaution Comments: Cortrak, global aphasia, L below elbow amputation hx, L inattention Restrictions Weight Bearing Restrictions: No  Vital Signs: Therapy Vitals Temp: 98.4 F (36.9 C) Temp Source: Oral Pulse Rate: 99 Resp: 17 BP: (!) 132/50 Patient Position (if appropriate): Lying Oxygen Therapy SpO2: 92 % O2 Device: Room Air Pain: Pain Assessment Pain Scale: Faces Faces Pain Scale: Hurts little more Pain Type: Surgical pain Pain Location: Abdomen Pain Orientation: Mid Pain Onset: On-going Patients Stated Pain Goal: 2 Pain Intervention(s): Medication (See eMAR)    Therapy/Group: Individual Therapy  AustLorie Phenix3/2022, 8:59 AM

## 2020-08-14 NOTE — Progress Notes (Signed)
Occupational Therapy Session Note  Patient Details  Name: Jennifer Lucero MRN: [id number] Date of Birth: 08/19/1950  Today's Date: 08/14/2020 OT Individual Time: 1349-1431 OT Individual Time Calculation (min): 42 min    Short Term Goals: Week 2:  OT Short Term Goal 1 (Week 2): STG = LTG 2/2 ELOS  Skilled Therapeutic Interventions/Progress Updates:    Pt received seated in w/c with husband present, agreeable to therapy with encouragement, as pt initially shaking head no. Session focus on self-care retraining, activity tolerance, func transfers, command follow, L inattention/visual scanning, attention to task in prep for improved ADL/func mobility performance + decreased caregiver burden. Doffed hospital gown with min A to untie/remove LUE, cont to req mod verbal and tactile cues to attend to LUE. Donned shirt with min A to thread LUE and visual cues to pull shirt down over L trunk. Amb with SPC to sink to brush hair and wash face with CGA. Amb transfer to bed in same manner. Pt grimacing and gesturing towards peg placement throughout session. Pt present with 9 colored circles with different numbers, pt able to accurately point to requested target in 8/10, 10/10, and then 4/10 trials. Pt then presented with pen and directed to copy circle design. Pt legibly drew circle, but then perseverated on only drawing circle when presented with different shapes. Pt cont to hold stomach, indicative of pain. Req mod A to return to supine 2/2 pain in abdomen, grimacing stopped when in supine. + 2 to boost in bed. Confirmed with husband that pt usually showers in tub/shower, req help at baseline to step over. Already has shower chair but anticipate need for TTB.   Pt left with HOB at 30 degrees, posey belt placed with bed alarm alarm engaged, telesitter on, husband present, call bell in reach, and all immediate needs met.    Therapy Documentation Precautions:  Precautions Precautions: Fall Precaution Comments:  Cortrak, global aphasia, L below elbow amputation hx, L inattention Restrictions Weight Bearing Restrictions: No  Pain: Pain Assessment Pain Scale: Faces Faces Pain Scale: Hurts little more Pain Type: Surgical pain Pain Location: Abdomen Pain Orientation: Mid;Upper Pain Intervention(s): Medication (See eMAR) ADL: See Care Tool for more details.  Therapy/Group: Individual Therapy  [name] MS, OTR/L  08/14/2020, 6:45 AM

## 2020-08-15 LAB — GLUCOSE, CAPILLARY
Glucose-Capillary: 162 mg/dL — ABNORMAL HIGH (ref 70–99)
Glucose-Capillary: 182 mg/dL — ABNORMAL HIGH (ref 70–99)
Glucose-Capillary: 237 mg/dL — ABNORMAL HIGH (ref 70–99)
Glucose-Capillary: 253 mg/dL — ABNORMAL HIGH (ref 70–99)
Glucose-Capillary: 279 mg/dL — ABNORMAL HIGH (ref 70–99)
Glucose-Capillary: 314 mg/dL — ABNORMAL HIGH (ref 70–99)
Glucose-Capillary: 317 mg/dL — ABNORMAL HIGH (ref 70–99)

## 2020-08-15 MED ORDER — INSULIN GLARGINE 100 UNIT/ML ~~LOC~~ SOLN
25.0000 [IU] | Freq: Every day | SUBCUTANEOUS | Status: DC
  Administered 2020-08-16 – 2020-08-20 (×5): 25 [IU] via SUBCUTANEOUS
  Filled 2020-08-15 (×6): qty 0.25

## 2020-08-15 MED ORDER — INSULIN ASPART 100 UNIT/ML IJ SOLN
3.0000 [IU] | Freq: Three times a day (TID) | INTRAMUSCULAR | Status: DC
  Administered 2020-08-15 – 2020-08-20 (×15): 3 [IU] via SUBCUTANEOUS

## 2020-08-15 NOTE — Progress Notes (Signed)
PROGRESS NOTE   Subjective/Complaints:  Awaiting dietary consult has not had bolus feed yet , tolerating H20 bolus 253m TID , osmolite 1.5 infusing via pump 654mper hour ROS- severe exp aphasia Objective:   IR GASTROSTOMY TUBE MOD SED  Result Date: 08/13/2020 INDICATION: [age]ear old with history of CVA and dysphagia. Dysphagia and poor oral intake. EXAM: PERCUTANEOUS GASTROSTOMY TUBE WITH FLUOROSCOPIC GUIDANCE Physician: AdStephan MinisterHeAnselm PancoastMD MEDICATIONS: Ancef 2 g; Antibiotics were administered within 1 hour of the procedure. Glucagon 1 mg IV ANESTHESIA/SEDATION: Versed 1.0 mg IV; Fentanyl 50 mcg IV Moderate Sedation Time:  15 The patient was continuously monitored during the procedure by the interventional radiology nurse under my direct supervision. FLUOROSCOPY TIME:  Fluoroscopy Time: 2.36 minutes, 36 mGy COMPLICATIONS: None immediate. PROCEDURE: Informed consent was obtained for a percutaneous gastrostomy tube. The patient was placed on the interventional table. An orogastric tube was placed with fluoroscopic guidance. The existing nasogastric tube was removed. The anterior abdomen was prepped and draped in sterile fashion. Maximal barrier sterile technique was utilized including caps, mask, sterile gowns, sterile gloves, sterile drape, hand hygiene and skin antiseptic. Stomach was inflated with air through the orogastric tube. The skin and subcutaneous tissues were anesthetized with 1% lidocaine. A 17 gauge needle was directed into the distended stomach with fluoroscopic guidance. A wire was advanced into the stomach and a T-tact was deployed. A 9-French vascular sheath was placed and the orogastric tube was snared using a Gooseneck snare device. The orogastric tube and snare were pulled out of the patient's mouth. The snare device was connected to a 20-French gastrostomy tube. The snare device and gastrostomy tube were pulled through the  patient's mouth and out the anterior abdominal wall. The gastrostomy tube was cut to an appropriate length. Contrast injection through gastrostomy tube confirmed placement within the stomach. Fluoroscopic images were obtained for documentation. The gastrostomy tube was flushed with normal saline. IMPRESSION: Successful fluoroscopic guided percutaneous gastrostomy tube placement. Electronically Signed   By: AdMarkus Daft.D.   On: 08/13/2020 14:25   No results for input(s): WBC, HGB, HCT, PLT in the last 72 hours.  No results for input(s): NA, K, CL, CO2, GLUCOSE, BUN, CREATININE, CALCIUM in the last 72 hours.  No intake or output data in the 24 hours ending 08/15/20 0904       Physical Exam: Vital Signs Blood pressure 136/64, pulse 95, temperature (!) 101.1 F (38.4 C), temperature source Oral, resp. rate 20, height '5\' 4"'$  (1.626 m), weight 95 kg, SpO2 94 %.  General: No acute distress Mood and affect are appropriate Heart: Regular rate and rhythm no rubs murmurs or extra sounds Lungs: Clear to auscultation, breathing unlabored, no rales or wheezes Abdomen: Positive bowel sounds, soft nontender to palpation, nondistended Extremities: No clubbing, cyanosis, or edema Skin: No evidence of breakdown, no evidence of rash, G tube site CDI, no drainage   Neurologic:  motor strength is 5/5 in right and 4/5 left deltoid, bicep, tricep, grip, hip flexor, knee extensors, ankle dorsiflexor and plantar flexor  Musculoskeletal: Full range of motion in all 4 extremities, no pain with upper extremity or lower extremity range of motion.. No joint swelling  Status post left hand amputation, chronic   Assessment/Plan: 1. Functional deficits which require 3+ hours per day of interdisciplinary therapy in a comprehensive inpatient rehab setting. Physiatrist is providing close team supervision and 24 hour management of active medical problems listed below. Physiatrist and rehab team continue to assess barriers  to discharge/monitor patient progress toward functional and medical goals  Care Tool:  Bathing    Body parts bathed by patient: Left arm, Chest, Abdomen, Right upper leg, Left upper leg, Right lower leg, Left lower leg, Face   Body parts bathed by helper: Left arm, Front perineal area, Buttocks     Bathing assist Assist Level: Moderate Assistance - Patient 50 - 74%     Upper Body Dressing/Undressing Upper body dressing   What is the patient wearing?: Hospital gown only, Pull over shirt    Upper body assist Assist Level: Minimal Assistance - Patient > 75%    Lower Body Dressing/Undressing Lower body dressing      What is the patient wearing?: Pants     Lower body assist Assist for lower body dressing: Minimal Assistance - Patient > 75%     Toileting Toileting    Toileting assist Assist for toileting: Dependent - Patient 0%     Transfers Chair/bed transfer  Transfers assist     Chair/bed transfer assist level: Contact Guard/Touching assist     Locomotion Ambulation   Ambulation assist      Assist level: Minimal Assistance - Patient > 75% Assistive device: No Device Max distance: 5'   Walk 10 feet activity   Assist     Assist level: Moderate Assistance - Patient - 50 - 74% (+2 wc follow) Assistive device: Hand held assist   Walk 50 feet activity   Assist    Assist level: Moderate Assistance - Patient - 50 - 74% Assistive device: Hand held assist    Walk 150 feet activity   Assist Walk 150 feet activity did not occur: Safety/medical concerns         Walk 10 feet on uneven surface  activity   Assist Walk 10 feet on uneven surfaces activity did not occur: Safety/medical concerns         Wheelchair     Assist Will patient use wheelchair at discharge?: No      Wheelchair assist level: Moderate Assistance - Patient 50 - 74% Max wheelchair distance: 93f    Wheelchair 50 feet with 2 turns activity    Assist     Wheelchair 50 feet with 2 turns activity did not occur: Safety/medical concerns   Assist Level: Maximal Assistance - Patient 25 - 49% (maxA for two turns)   Wheelchair 150 feet activity     Assist  Wheelchair 150 feet activity did not occur: Safety/medical concerns       Blood pressure 136/64, pulse 95, temperature (!) 101.1 F (38.4 C), temperature source Oral, resp. rate 20, height '5\' 4"'$  (1.626 m), weight 95 kg, SpO2 94 %.   Medical Problem List and Plan: 1.  Side weakness with aphasia/dysphagia secondary to right MCA infarction secondary to right M2 occlusion status post mechanical thrombectomy             -patient may shower              -ELOS/Goals: 2.5-3 weeks supervision to min A-     -con't PT, OT and SLP- 2.  Antithrombotics: -DVT/anticoagulation: Subcutaneous heparin             -  antiplatelet therapy: Aspirin 325 mg daily.  Consider to resume Eliquis 10 to 14 days given large infarct with hemorrhagic transformation as noted per neurology services. 3. Pain Management: Tylenol as needed 4. Mood: Provide emotional support             -antipsychotic agents: Seroquel 25 mg nightly  7/17- sleeping OK per staff- con't regimen 5. Neuropsych: This patient is not capable of making decisions on her own behalf. 6. Skin/WounModerate d Care: Routine skin checks  7/17- MASD- will con't Gerhardt's and cream- received 3 doses of  IV Diflucan with much less redness, will d/c 7. Fluids/Electrolytes/Nutrition: Routine in and outs with follow-up chemistries 8.  Seizure prophylaxis.  EEG negative.  Keppra 500 mg twice daily. 9.  Dysphagia.  NPO.  Alternative means of nutritional support. S/P PEG, consult dietary for bolus feed recs 10.  Atrial fibrillation.  Lopressor 25 mg twice daily.  Await plans to resume Eliquis 11.  Hypertension.  Isordil 60 mg twice daily, lisinopril 20 mg daily.  Monitor with increased mobility 12.  Diabetes mellitus.  Hemoglobin A1c 9.6.  NovoLog 2 units every 4  hours, Lantus insulin 25 units daily.  Check blood sugars before meals and at bedtime CBG (last 3)  Recent Labs    08/15/20 0009 08/15/20 0412 08/15/20 0742  GLUCAP 237* 314* 279*   Improved cont novalog  3U, will change to TID ac now that bolus feeds are starting  Increase lantus to 25U  13.  Hyperlipidemia.  Pravachol 14. Expressive aphasia- needs communication board. 15. Urinary incontinence- having since stroke-likely urgency plus communication issues 16.  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 RUE skin tear- skin care being done- locally 18.  Loose incont stools - no recent abx abd exam unremarkable , likelyTF related +/- meds ?pravastatin    LOS: 10 days A FACE TO FACE EVALUATION WAS PERFORMED  Charlett Blake 08/15/2020, 9:04 AM

## 2020-08-15 NOTE — Progress Notes (Signed)
While giving pt her 267m bolus feed of osmolite 1.5 by gravity at dinner time pt began having discomfort halfway through bolus. Asked pt if she was having pain while the feed was going into her tube, she nodded her head yes, and began to hold her belly. Pt still having soreness at tube site. Tolerated lunch time bolus well getting the whole 2696m Only able to tolerate 15091mt dinner. No c/o nausea vomitting, loose stools, only pain and discomfort with feed.   VicDayna Ramus

## 2020-08-16 DIAGNOSIS — G8194 Hemiplegia, unspecified affecting left nondominant side: Secondary | ICD-10-CM

## 2020-08-16 DIAGNOSIS — R195 Other fecal abnormalities: Secondary | ICD-10-CM

## 2020-08-16 DIAGNOSIS — R1312 Dysphagia, oropharyngeal phase: Secondary | ICD-10-CM

## 2020-08-16 LAB — GLUCOSE, CAPILLARY
Glucose-Capillary: 151 mg/dL — ABNORMAL HIGH (ref 70–99)
Glucose-Capillary: 156 mg/dL — ABNORMAL HIGH (ref 70–99)
Glucose-Capillary: 169 mg/dL — ABNORMAL HIGH (ref 70–99)
Glucose-Capillary: 199 mg/dL — ABNORMAL HIGH (ref 70–99)
Glucose-Capillary: 367 mg/dL — ABNORMAL HIGH (ref 70–99)
Glucose-Capillary: 395 mg/dL — ABNORMAL HIGH (ref 70–99)

## 2020-08-16 MED ORDER — QUETIAPINE FUMARATE 25 MG PO TABS
25.0000 mg | ORAL_TABLET | Freq: Once | ORAL | Status: AC
  Administered 2020-08-16: 25 mg
  Filled 2020-08-16: qty 1

## 2020-08-16 MED ORDER — OSMOLITE 1.5 CAL PO LIQD
1000.0000 mL | ORAL | Status: DC
  Filled 2020-08-16: qty 1000

## 2020-08-16 MED ORDER — OSMOLITE 1.5 CAL PO LIQD
250.0000 mL | Freq: Four times a day (QID) | ORAL | Status: DC
  Administered 2020-08-16 (×2): 250 mL
  Filled 2020-08-16: qty 474

## 2020-08-16 MED ORDER — OSMOLITE 1.5 CAL PO LIQD: 237.0000 mL | Freq: Four times a day (QID) | ORAL | Status: DC

## 2020-08-16 MED ORDER — NUTRISOURCE FIBER PO PACK
1.0000 | PACK | Freq: Two times a day (BID) | ORAL | Status: DC
  Administered 2020-08-16 – 2020-08-20 (×8): 1
  Filled 2020-08-16 (×8): qty 1

## 2020-08-16 MED ORDER — OSMOLITE 1.5 CAL PO LIQD: 1000.0000 mL | ORAL | Status: DC

## 2020-08-16 NOTE — Progress Notes (Signed)
PROGRESS NOTE   Subjective/Complaints:  Pt in bed. Appears comfortable. Nods Y/N to questions, seems somewhat accurate  ROS: limited due to language/communication    No results found. No results for input(s): WBC, HGB, HCT, PLT in the last 72 hours.  No results for input(s): NA, K, CL, CO2, GLUCOSE, BUN, CREATININE, CALCIUM in the last 72 hours.   Intake/Output Summary (Last 24 hours) at 08/16/2020 1302 Last data filed at 08/16/2020 0842 Gross per 24 hour  Intake 378 ml  Output --  Net 378 ml        Physical Exam: Vital Signs Blood pressure (!) 136/52, pulse (!) 102, temperature 98.7 F (37.1 C), temperature source Oral, resp. rate 18, height '5\' 4"'$  (1.626 m), weight 95.4 kg, SpO2 93 %.  Constitutional: No distress . Vital signs reviewed. HEENT: EOMI, oral membranes moist Neck: supple Cardiovascular: RRR without murmur. No JVD    Respiratory/Chest: CTA Bilaterally without wheezes or rales. Normal effort    GI/Abdomen: BS +, non-tender, non-distended Ext: no clubbing, cyanosis, or edema Psych: pleasant and cooperative  Skin: No evidence of breakdown, no evidence of rash, G tube site with old blood on dressing.   Neurologic:  motor strength is 5/5 in right and 4/5  left deltoid, bicep, tricep, grip, hip flexor, knee extensors, ankle dorsiflexor and plantar flexor. Senses pain and light touch. No resting tone.   Musculoskeletal: Full range of motion in all 4 extremities, no pain with upper extremity or lower extremity range of motion.. No joint swelling Status post left trans-radial amputation, chronic   Assessment/Plan: 1. Functional deficits which require 3+ hours per day of interdisciplinary therapy in a comprehensive inpatient rehab setting. Physiatrist is providing close team supervision and 24 hour management of active medical problems listed below. Physiatrist and rehab team continue to assess barriers to  discharge/monitor patient progress toward functional and medical goals  Care Tool:  Bathing    Body parts bathed by patient: Left arm, Chest, Abdomen, Right upper leg, Left upper leg, Right lower leg, Left lower leg, Face   Body parts bathed by helper: Left arm, Front perineal area, Buttocks     Bathing assist Assist Level: Moderate Assistance - Patient 50 - 74%     Upper Body Dressing/Undressing Upper body dressing   What is the patient wearing?: Hospital gown only, Pull over shirt    Upper body assist Assist Level: Minimal Assistance - Patient > 75%    Lower Body Dressing/Undressing Lower body dressing      What is the patient wearing?: Pants     Lower body assist Assist for lower body dressing: Minimal Assistance - Patient > 75%     Toileting Toileting    Toileting assist Assist for toileting: Dependent - Patient 0%     Transfers Chair/bed transfer  Transfers assist     Chair/bed transfer assist level: Contact Guard/Touching assist     Locomotion Ambulation   Ambulation assist      Assist level: Minimal Assistance - Patient > 75% Assistive device: No Device Max distance: 5'   Walk 10 feet activity   Assist     Assist level: Moderate Assistance - Patient - 33 -  74% (+2 wc follow) Assistive device: Hand held assist   Walk 50 feet activity   Assist    Assist level: Moderate Assistance - Patient - 50 - 74% Assistive device: Hand held assist    Walk 150 feet activity   Assist Walk 150 feet activity did not occur: Safety/medical concerns         Walk 10 feet on uneven surface  activity   Assist Walk 10 feet on uneven surfaces activity did not occur: Safety/medical concerns         Wheelchair     Assist Will patient use wheelchair at discharge?: No      Wheelchair assist level: Moderate Assistance - Patient 50 - 74% Max wheelchair distance: 70f    Wheelchair 50 feet with 2 turns activity    Assist     Wheelchair 50 feet with 2 turns activity did not occur: Safety/medical concerns   Assist Level: Maximal Assistance - Patient 25 - 49% (maxA for two turns)   Wheelchair 150 feet activity     Assist  Wheelchair 150 feet activity did not occur: Safety/medical concerns       Blood pressure (!) 136/52, pulse (!) 102, temperature 98.7 F (37.1 C), temperature source Oral, resp. rate 18, height '5\' 4"'$  (1.626 m), weight 95.4 kg, SpO2 93 %.   Medical Problem List and Plan: 1.  Side weakness with aphasia/dysphagia secondary to right MCA infarction secondary to right M2 occlusion status post mechanical thrombectomy             -patient may shower              -ELOS/Goals: 2.5-3 weeks supervision to min A-     -Continue CIR therapies including PT, OT, and SLP  2.  Antithrombotics: -DVT/anticoagulation: Subcutaneous heparin             -antiplatelet therapy: Aspirin 325 mg daily.  Consider to resume Eliquis 10 to 14 days given large infarct with hemorrhagic transformation as noted per neurology services. 3. Pain Management: Tylenol as needed 4. Mood: Provide emotional support             -antipsychotic agents: Seroquel 25 mg nightly  Sleeping at night currently 5. Neuropsych: This patient is not capable of making decisions on her own behalf. 6. Skin/WounModerate d Care: Routine skin checks  7/17- MASD-  con't Gerhardt's and cream- received 3 doses of  IV Diflucan with improvement 7. Fluids/Electrolytes/Nutrition: Routine in and outs with follow-up chemistries 8.  Seizure prophylaxis.  EEG negative.  Keppra 500 mg twice daily. 9.  Dysphagia.  NPO.  Alternative means of nutritional support. S/P PEG -continue current bolus feeds 10.  Atrial fibrillation.  Lopressor 25 mg twice daily.  Await plans to resume Eliquis 11.  Hypertension.  Isordil 60 mg twice daily, lisinopril 20 mg daily.  Monitor with increased mobility 12.  Diabetes mellitus.  Hemoglobin A1c 9.6.  NovoLog 2 units every 4  hours, Lantus insulin 25 units daily.  Check blood sugars before meals and at bedtime CBG (last 3)  Recent Labs    08/16/20 0405 08/16/20 0823 08/16/20 1207  GLUCAP 151* 199* 395*  Improved cont novolog  3U, changed to TID ac now that bolus feeds are starting  Increased lantus to 25U   -7/25 sugars still labile---observe with recent changes 13.  Hyperlipidemia.  Pravachol 14. Expressive aphasia- needs communication board. 15. Urinary incontinence- having since stroke-likely urgency plus communication issues 16.  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 RUE skin tear- skin care being done- locally 18.  Loose incont stools - persistent - likelyTF/bolus related +/-meds ?pravastatin   -will ask RD for further recs -add fiber and questran to bulk up stool -lomotil prn  LOS: 11 days A FACE TO FACE EVALUATION WAS PERFORMED  Meredith Staggers 08/16/2020, 1:02 PM

## 2020-08-16 NOTE — Progress Notes (Signed)
Speech Language Pathology Daily Session Note  Patient Details  Name: Jennifer Lucero MRN: [id number] Date of Birth: 1950-04-30  Today's Date: 08/16/2020 SLP Individual Time: 1015-1100 SLP Individual Time Calculation (min): 45 min  Short Term Goals: Week 2: SLP Short Term Goal 1 (Week 2): ST=LTGs due to ELOS  Skilled Therapeutic Interventions: Skilled treatment session focused on cognitive-linguistic goals. Upon arrival, patient immediately shook her head "no." Patient indicated she had stomach discomfort by utilizing gestures and pointed to the number "10" when given a pain scale. RN aware and administered medications. Patient eventually agreeable to treatment session. SLP produced a simple 4 picture communication board in which patient was able to identify the pictures with 50% accuracy during the first attempt and 75% accuracy during the second attempt. Patient was then incontinent of bowel without awareness and required extra time and Min verbal and visual cues to follow commands during peri care. Patient left upright in the recliner with alarm on and all needs within reach. Continue with current plan of care.      Pain Pain Assessment Pain Scale: 10 Faces Pain Scale: Hurts even more Pain Type: Acute pain;Surgical pain Pain Location: Abdomen Pain Orientation: Mid;Upper Pain Onset: On-going Patients Stated Pain Goal: 0 Pain Intervention(s): Repositioned  Therapy/Group: Individual Therapy  Chao Blazejewski 08/16/2020, 12:50 PM

## 2020-08-16 NOTE — Progress Notes (Signed)
Nutrition Follow-up  DOCUMENTATION CODES:  Obesity unspecified  INTERVENTION:  Restart Osmolite 1.5 via PEG @ 60 ml/hr x 20 hours (may hold TF for up to 4 hours for therapy).  Provide 45 ml Prosource TF BID per tube.    Provide free water flushes of 200 ml TID per tube.   Tube feeding regimen provides 1880 kcal, 97 grams of protein, and 1512 ml free water.   NUTRITION DIAGNOSIS:  Inadequate oral intake related to inability to eat as evidenced by NPO status. - ongoing  GOAL:  Patient will meet greater than or equal to 90% of their needs - met with TF  MONITOR:  TF tolerance, Skin, Weight trends, Labs, I & O's  REASON FOR ASSESSMENT:  Consult Enteral/tube feeding initiation and management  ASSESSMENT:  70 year old female with history of atrial fibrillation, diabetes mellitus, hypertension hyperlipidemia presents with acute onset of left-sided weakness and rightward gaze as well as aphasia.  Cranial CT scan showed right insular and Peri insular acute infarct as well as old left MCA infarction. CT angiogram demonstrated right M2 occlusion. Underwent mechanical thrombectomy. MRI follow-up showed evolving acute to early subacute right MCA distribution infarct associated petechial hemorrhage. Patient currently n.p.o. with alternative means of nutritional support. Pt with left-sided weakness and decreased functional mobility was admitted for a comprehensive rehab program. 7/22 - PEG tube placed 7/24 - abdominal discomfort after receiving 1 carton of Osmolite 1.5 for bolus feeding   Per LPN note yesterday, "While giving pt her 257m bolus feed of osmolite 1.5 by gravity at dinner time pt began having discomfort halfway through bolus. Asked pt if she was having pain while the feed was going into her tube, she nodded her head yes, and began to hold her belly. Pt still having soreness at tube site. Tolerated lunch time bolus well getting the whole 2612m Only able to tolerate [id number] dinner. No  c/o nausea, vomiting, loose stools, only pain and discomfort with feed."  Admit wt: 94.5 kg Current wt: 95.4 kg  Recommend restarting continuous feeds to help with abdominal pain and diarrhea.  Medications: reviewed; NutriSource Fiber BID, SSI, Lantus, MVI with minerals daily, Keppra BID via IV  Labs: reviewed; CBG 151-317  Diet Order:   Diet Order             Diet NPO time specified  Diet effective now                  EDUCATION NEEDS:  Not appropriate for education at this time  Skin:  Skin Assessment: Reviewed RN Assessment  Last BM:  08/16/20 - Type 7, small  Height:  Ht Readings from Last 1 Encounters:  08/10/20 5' 4"  (1.626 m)   Weight:  Wt Readings from Last 1 Encounters:  08/16/20 95.4 kg   BMI:  Body mass index is 36.1 kg/m.  Estimated Nutritional Needs:  Kcal:  1800-2000 Protein:  95-110 grams Fluid:  >/=1.8 L/day  MegDerrel NipD, LDN (she/her/hers) Registered Dietitian I After-Hours/Weekend Pager # [place]

## 2020-08-16 NOTE — Progress Notes (Signed)
Occupational Therapy Session Note  Patient Details  Name: Jennifer Lucero MRN: [id number] Date of Birth: 08-29-1950  Today's Date: 08/16/2020 OT Individual Time: 1105-1120 OT Individual Time Calculation (min): 15 min  and Today's Date: 08/16/2020 OT Missed Time: 15 Minutes Missed Time Reason: Patient fatigue;Patient ill (comment) (stomach/abdominal pain)   Short Term Goals: Week 1:  OT Short Term Goal 1 (Week 1): Pt will don shirt with mod A using hemi-technique. OT Short Term Goal 1 - Progress (Week 1): Met OT Short Term Goal 2 (Week 1): Pt will don pants with min A. OT Short Term Goal 2 - Progress (Week 1): Met OT Short Term Goal 3 (Week 1): Pt will stand for > 5 min with CGA in prep for standing ADL. OT Short Term Goal 3 - Progress (Week 1): Met OT Short Term Goal 4 (Week 1): Pt will complete toilet transfer with CGA and LRAD. OT Short Term Goal 4 - Progress (Week 1): Met Week 2:  OT Short Term Goal 1 (Week 2): STG = LTG 2/2 ELOS   Skilled Therapeutic Interventions/Progress Updates:    Pt greeted at time of session sitting up in recliner, non verbal but shaking head for yes/no questions. Rn present throughout session as well setting up IV meds. Pt agreeable to try to participate in OT despite abdominal discomfort (nursing aware) and declined toileting/ADL needs. Retrieved 3# dumbbell for RUE there ex, pt performed 1 set of 10 bicep curls before wincing in pain, unable to tolerate further. Set up with alarm on call bell in reach and RN in room. Missed 15 minutes d/t pain and dscomfort.   Therapy Documentation Precautions:  Precautions Precautions: Fall Precaution Comments: Cortrak, global aphasia, L below elbow amputation hx, L inattention Restrictions Weight Bearing Restrictions: No    Therapy/Group: Individual Therapy  Viona Gilmore 08/16/2020, 7:29 AM

## 2020-08-16 NOTE — Progress Notes (Signed)
Physical Therapy Weekly Progress Note  Patient Details  Name: Jennifer Lucero MRN: [id number] Date of Birth: 10/26/1950  Beginning of progress report period: August 06, 2020 End of progress report period: August 16, 2020  Today's Date: 08/16/2020 PT Individual Time: 0902-0959 and 7494-4967 PT Individual Time Calculation (min): 57 min and 15 min  Patient has met 4 of 5 short term goals.  She has made good progress with functional mobility. Demos significant improvements with strength, balance as well as minimal progress with cognition since [facility].   Patient continues to demonstrate the following deficits muscle weakness, decreased cardiorespiratoy endurance, unbalanced muscle activation, decreased coordination, and decreased motor planning, field cut, decreased attention to left, decreased initiation, decreased attention, decreased awareness, decreased problem solving, decreased safety awareness, decreased memory, and delayed processing, and decreased standing balance and decreased balance strategies and therefore will continue to benefit from skilled PT intervention to increase functional independence with mobility.  Patient progressing toward long term goals..  Continue plan of care.  PT Short Term Goals Week 1:  PT Short Term Goal 1 (Week 1): Pt will consistently complete rolling ModA PT Short Term Goal 1 - Progress (Week 1): Met PT Short Term Goal 2 (Week 1): Pt will consistently complete supine <> sit ModA PT Short Term Goal 2 - Progress (Week 1): Met PT Short Term Goal 3 (Week 1): Pt will ambulate 110f MinA PT Short Term Goal 3 - Progress (Week 1): Met PT Short Term Goal 4 (Week 1): Pt will improve attention towards Left environment requiring cuing for attention <75% of the time PT Short Term Goal 4 - Progress (Week 1): Met PT Short Term Goal 5 (Week 1): Pt will complete stairs with MinA PT Short Term Goal 5 - Progress (Week 1): Progressing toward goal Week 2:  PT Short Term Goal 1 (Week  2): STG = LTG d/t ELOS  Skilled Therapeutic Interventions/Progress Updates:  Session 1: Patient supine in bed on entrance to room and grabbing abdomen. When asked if her stomach hurt, pt nodded yes, With continued questioning, pt indicated with head nods/ turns and gesturing that her new PEG tube placement site is hurting and she is also experiencing other abdominal pain, most likely GI in nature with potential painful bloating. Patient alert and agreeable to some of PT session. Patient indicates pain level at least 6 or more out of 10 on faces pain scale.   Therapeutic Activity: Bed Mobility: Patient performed supine <> sit with Min A d/t pain at abdomen. VC/ tc required for technique and reducing abdominal effort and allowing PT to provide assist as pt impulsive in move to sit up and unable to follow instructions for roll to sidelying then push up into sitting. Indicates need to toilet. Transfers: Patient performed STS and SPVT transfers  with supervision throughout session. Provided verbal cues for taking time with all transfers. On standing from EOB, noted bowel incontinence in bed. Ambulatory toilet transfer performed with supervision. During toileting, pt demos motor perseveration on pulling of toilet paper, winding/ wadding with attempt at proper use with 50% completion with discard in and around toilet. Pt requires consistent toileting throughout session for bowel incontinence as well as noted urge with three total trips to toilet and noted incontinence once getting up from seat. Similar motor perseveration noted each trip.   Gait Training:  Pt unwilling to ambulate far distances d/t abdominal pain, however agreeable to ambulation in/ around room. Agreeable with education re: mobility to improve GI motility and  assist with gas/ bloating. Patient ambulated throughout circumference of room with close supervision/ intermittent CGA for balance. Demonstrated increased lateral sway without SPC and  improved slightly with use of SPC. Good ability to mobilize throughout small spaces in crowded room. . Provided vc/ tc for safety awareness and maintaining upright posture.  At end of session, pt continues to inidicate abdominal pain. Does not wish to return to bed or sit in w/c. Pt willing to try recliner. Recliner setup with chuck pad for hygiene and pillows for comfort. Seated in recliner with BLE elevated with brakes locked, belt alarm set low on abdomen and loose to prevent irritation/ pressure to PEG placement, and all needs within reach on tray table in front of her. Oriented to time and next therapy session with SLP.     Session 2:  Pt seated in recliner and continues to c/o abdominal pain on entrance to room. On faces scale, indicates at least 6/ 10. Refuses therapy at this time indicating pain at PEG site. With questioning, pt indicates that pain is not from bowels/ gas but more from PEG placement. When asked, pt declines offer of pain medication with relating pain to RN. Pt guided in STS in order to change chuck pad, provide pericare and reposition in recliner pillows.   Missed 30 min of treatment time d/t pt refusal from pain at new PEG tube placement site.   Therapy Documentation Precautions:  Precautions Precautions: Fall Precaution Comments: New PEG placement 7/22, global aphasia, L inattention, h/o L wrist disarticulation Restrictions Weight Bearing Restrictions: No Pain: Pain Assessment Pain Scale: Faces Faces Pain Scale: Hurts even more Pain Type: Acute pain;Surgical pain Pain Location: Abdomen Pain Orientation: Mid;Upper Pain Onset: On-going Patients Stated Pain Goal: 0 Pain Intervention(s): Repositioned  Therapy/Group: Individual Therapy  Alger Simons PT, DPT 08/16/2020, 9:37 AM

## 2020-08-17 LAB — CBC
HCT: 33.1 % — ABNORMAL LOW (ref 36.0–46.0)
Hemoglobin: 10.2 g/dL — ABNORMAL LOW (ref 12.0–15.0)
MCH: 29.8 pg (ref 26.0–34.0)
MCHC: 30.8 g/dL (ref 30.0–36.0)
MCV: 96.8 fL (ref 80.0–100.0)
Platelets: 423 10*3/uL — ABNORMAL HIGH (ref 150–400)
RBC: 3.42 MIL/uL — ABNORMAL LOW (ref 3.87–5.11)
RDW: 15.9 % — ABNORMAL HIGH (ref 11.5–15.5)
WBC: 10.7 10*3/uL — ABNORMAL HIGH (ref 4.0–10.5)
nRBC: 0 % (ref 0.0–0.2)

## 2020-08-17 LAB — COMPREHENSIVE METABOLIC PANEL
ALT: 52 U/L — ABNORMAL HIGH (ref 0–44)
AST: 34 U/L (ref 15–41)
Albumin: 2.5 g/dL — ABNORMAL LOW (ref 3.5–5.0)
Alkaline Phosphatase: 80 U/L (ref 38–126)
Anion gap: 12 (ref 5–15)
BUN: 47 mg/dL — ABNORMAL HIGH (ref 8–23)
CO2: 23 mmol/L (ref 22–32)
Calcium: 9 mg/dL (ref 8.9–10.3)
Chloride: 104 mmol/L (ref 98–111)
Creatinine, Ser: 1.34 mg/dL — ABNORMAL HIGH (ref 0.44–1.00)
GFR, Estimated: 42 mL/min — ABNORMAL LOW (ref >60)
Glucose, Bld: 139 mg/dL — ABNORMAL HIGH (ref 70–99)
Potassium: 4.5 mmol/L (ref 3.5–5.1)
Sodium: 139 mmol/L (ref 135–145)
Total Bilirubin: 0.6 mg/dL (ref 0.3–1.2)
Total Protein: 6.7 g/dL (ref 6.5–8.1)

## 2020-08-17 LAB — GLUCOSE, CAPILLARY
Glucose-Capillary: 133 mg/dL — ABNORMAL HIGH (ref 70–99)
Glucose-Capillary: 136 mg/dL — ABNORMAL HIGH (ref 70–99)
Glucose-Capillary: 219 mg/dL — ABNORMAL HIGH (ref 70–99)
Glucose-Capillary: 180 mg/dL — ABNORMAL HIGH (ref 70–99)
Glucose-Capillary: 248 mg/dL — ABNORMAL HIGH (ref 70–99)

## 2020-08-17 MED ORDER — OSMOLITE 1.5 CAL PO LIQD
237.0000 mL | Freq: Three times a day (TID) | ORAL | Status: DC
  Administered 2020-08-17 – 2020-08-20 (×11): 237 mL
  Filled 2020-08-17: qty 237

## 2020-08-17 MED ORDER — LEVETIRACETAM 100 MG/ML PO SOLN
500.0000 mg | Freq: Two times a day (BID) | ORAL | Status: DC
  Administered 2020-08-17 – 2020-08-20 (×7): 500 mg
  Filled 2020-08-17 (×7): qty 5

## 2020-08-17 MED ORDER — APIXABAN 5 MG PO TABS
5.0000 mg | ORAL_TABLET | Freq: Two times a day (BID) | ORAL | Status: DC
  Administered 2020-08-17 – 2020-08-20 (×7): 5 mg
  Filled 2020-08-17 (×7): qty 1

## 2020-08-17 MED ORDER — QUETIAPINE FUMARATE 50 MG PO TABS
50.0000 mg | ORAL_TABLET | Freq: Every day | ORAL | Status: DC
  Administered 2020-08-17 – 2020-08-19 (×3): 50 mg
  Filled 2020-08-17 (×3): qty 1

## 2020-08-17 NOTE — Progress Notes (Signed)
Physical Therapy Session Note  Patient Details  Name: Jennifer Lucero MRN: [id number] Date of Birth: November 02, 1950  Today's Date: 08/17/2020 PT Individual Time:  1443-1540  PT Individual Time Calculation (min): 57 min    Short Term Goals: Week 1:  PT Short Term Goal 1 (Week 1): Pt will consistently complete rolling ModA PT Short Term Goal 1 - Progress (Week 1): Met PT Short Term Goal 2 (Week 1): Pt will consistently complete supine <> sit ModA PT Short Term Goal 2 - Progress (Week 1): Met PT Short Term Goal 3 (Week 1): Pt will ambulate 140f MinA PT Short Term Goal 3 - Progress (Week 1): Met PT Short Term Goal 4 (Week 1): Pt will improve attention towards Left environment requiring cuing for attention <75% of the time PT Short Term Goal 4 - Progress (Week 1): Met PT Short Term Goal 5 (Week 1): Pt will complete stairs with MinA PT Short Term Goal 5 - Progress (Week 1): Progressing toward goal Week 2:  PT Short Term Goal 1 (Week 2): STG = LTG d/t ELOS   Skilled Therapeutic Interventions/Progress Updates:  Patient seated in recliner on entrance to room. Patient alert and agreeable to PT session. Patient related continued pain at abdomen at site of new PEG tube placement, but does not demonstrate pain to the severity that she did in yesterday's session. Husband present for family education.   Therapeutic Activity: Transfers: Patient performed STS and SPVT transfers throughout session with hold to abdomen and supervision. Provided verbal cues for completing pivot turn to seat and reaching back for armrest prior to sitting for increased safety. Educated husband on safe technique.   Toilet transfer with CGA to toilet and Min A for clothing mgmt. Min A for managing toilet paper from roll. Pericare performed adequately with supervision.   Gait Training:  Patient ambulated >200 feet using SPC with CGA/ Min A for decreased balance this day.  Demonstrated to husband higher placement and use of gait  belt in order to prevent further irritation to PEG tube placement. Provided vc/ tc for maintaining forward gaze as pt very visually distracted as we ambulate throughout hallways. Requires up to Mod A to maintain stance throughout LOB.   Completed stair training with use of RHR up and down four 6" steps. Pt and husband provided with verbal instructions and visual demonstration of leading LE to ascend/ descend safely. Pt demos desire to reciprocate steps with decreased strength in LLE and improved quality of stair negotiation using RLE to ascend. Requires vc/ tc throughout for proper technique. Husband taught to remain below pt during performance of steps. When pt encouraged to complete steps again with husband assisting, pt indicates that abdomen is hurting and does not want to perform stairs again.   Wheelchair Mobility:  Discussion with husband re: pt's cognition and question of safety awareness in and around home and recommendation for w/c mobility within home for pt to mobilize under supervision with reduced risk for falls. Current cognition, safety awareness, balance will require up to Min A for any ambulation.  Patient guided in propel of wheelchair over 75 feet with use of BLE only. Provided heavy instruction and vc/ tc initially for technique to improve forward momentum with pt improving over 25 feet from Mod A to CVardaman then up to supervision. Demos ability to complete 90 deg turns to R and L as well as turning w/c 360 degrees all under supervision by end of session.  Patient requested to remain in  w/c and is seated upright  in w/c  at end of session with brakes locked, belt alarm set, covered in blanket, and all needs within reach. Husband in room. Continued family education tomorrow.     Therapy Documentation Precautions:  Precautions Precautions: Fall Precaution Comments: New PEG placement 7/22, global aphasia, L inattention, h/o L wrist disarticulation Restrictions Weight Bearing  Restrictions: No  Therapy/Group: Individual Therapy  Jennifer Lucero PT, DPT 08/17/2020, 5:35 PM

## 2020-08-17 NOTE — Progress Notes (Signed)
Occupational Therapy Session Note  Patient Details  Name: Jennifer Lucero MRN: [id number] Date of Birth: 12-Dec-1950  Today's Date: 08/17/2020 OT Individual Time: 1002-1057 OT Individual Time Calculation (min): 55 min + 39 min   Short Term Goals: Week 2:  OT Short Term Goal 1 (Week 2): STG = LTG 2/2 ELOS  Skilled Therapeutic Interventions/Progress Updates:    Session 1 [id number]): Pt received seated in recliner with husband present, agreeable to therapy. Session focus on self-care retraining, activity tolerance, func transfers and command follow in prep for improved ADL/IADL/func mobility performance + decreased caregiver burden. Pt grimacing and gesturing towards stomach, LPN notified and present to administer pain medication. STS with CGA and amb to and from toilet with CGA and SPC. No void but CGA for standing pericare. Amb back to w/c. Bathed UB with min A to bathe RUE and mod Vcs to bathe LUE, bathed LB minus periarea this date with min A to reach feet. Doffed shirt min A from husband to pull over head, donned new shirt min A to thread LUE. Donned pants with min A for correct orientation and to pull over L hips. Washed sink at sink with use of hair washing tray, pt able to loosely hold strap but req consistent cues to lean head back. Dried and brushed hair with min A for thoroughness. Amb back to recliner. Discussed rec with LPN/husband for timed toileting and to forgo briefs at night in lieu of towels, may trial cotton underwear if incontinence episodes decrease. LPN/this therapist will additionally confirm with MD when pt will be able to enter shower sp G tube placement. Husband with no further questions at this time.  Pt left in recliner with safety belt alarm engaged, husband present, telesitter on,call bell in reach, and all immediate needs met.    Session 2 [phone]): Pt received seated in recliner with husband present, agreeable to therapy. Session focus on self-care retraining,  activity tolerance, func transfers, family education in prep for improved ADL/IADL/func mobility performance + decreased caregiver burden. Husband facilitated amb toilet transfer and toileting, pt req CGA and use of SPC. No void, but CGA for LB clothing management. Pt noted to be perseverative on removing toilet paper. Amb back to w/c. Total A w/c transport to and from therapy apartment 2/2 time management and energy conservation. Demonstrated and completed 1 TTB transfer with min to mod A for sequencing and managing BLE. Pt attempting to stand several times req extensive, direct Vcs to sit down. Discussed bathroom set-up and DME recs with husband, states he has no grab bars in shower. Verbalizes understanding of pt completing sponge bathe or waiting until [facility] assist to complete TTB t/f if he is not comfortable. Printed out 24/7 S DC recommendation sheets and reviewed functional mobility and ADL at [facility] level. Amb transfer back to recliner.  Pt left in recliner with safety belt alarm engaged, husband present, telesitter on,call bell in reach, and all immediate needs met.    Therapy Documentation Precautions:  Precautions Precautions: Fall Precaution Comments: New PEG placement 7/22, global aphasia, L inattention, h/o L wrist disarticulation Restrictions Weight Bearing Restrictions: No  Pain: see session notes   ADL: See Care Tool for more details.  Therapy/Group: Individual Therapy  [name] MS, OTR/L  08/17/2020, 6:45 AM

## 2020-08-17 NOTE — Progress Notes (Signed)
Patient ID: Jennifer Lucero, female   DOB: 07/06/1950, 70 y.o.   MRN: [id number]  Suction, Transfer Bench, and Bedside Commode ordered through [employer].  Darrol Angel [phone]

## 2020-08-17 NOTE — Progress Notes (Signed)
Patient ID: Jennifer Lucero, female   DOB: 1950-08-16, 70 y.o.   MRN: [id number]  [facility] List provided. Left spouse a detailed VM  Erlene Quan, [place]

## 2020-08-17 NOTE — Progress Notes (Signed)
Patient became combative last night when she attempted to leave her room. She was instructed to remain in her room and at that time she began swinging her arms in attempt to hit the the RN and NT. Reesa Chew was contacted and ordered an additional Seroquel and after 45 minutes the patient calmed down.

## 2020-08-17 NOTE — Progress Notes (Signed)
PROGRESS NOTE   Subjective/Complaints:  Patient remains with severe expressive aphasia.  Appreciate dietary note.  Labs reviewed Patient very mobile, had some agitation last night when getting up. ROS: limited due to language/communication    No results found. Recent Labs    08/17/20 0516  WBC 10.7*  HGB 10.2*  HCT 33.1*  PLT 423*    Recent Labs    08/17/20 0516  NA 139  K 4.5  CL 104  CO2 23  GLUCOSE 139*  BUN 47*  CREATININE 1.34*  CALCIUM 9.0    No intake or output data in the 24 hours ending 08/17/20 0916       Physical Exam: Vital Signs Blood pressure (!) 117/46, pulse 77, temperature 98.4 F (36.9 C), resp. rate 18, height '5\' 4"'$  (1.626 m), weight 95.4 kg, SpO2 96 %.   General: No acute distress Mood and affect are appropriate Heart: Regular rate and rhythm no rubs murmurs or extra sounds Lungs: Clear to auscultation, breathing unlabored, no rales or wheezes Abdomen: Positive bowel sounds, soft nontender to palpation, nondistended Extremities: No clubbing, cyanosis, or edema Skin: No evidence of breakdown, no evidence of rash  Neurologic:  motor strength is 5/5 in right and 4/5  left deltoid, bicep, tricep, grip, hip flexor, knee extensors, ankle dorsiflexor and plantar flexor. Senses pain and light touch. No resting tone.   Musculoskeletal: Full range of motion in all 4 extremities, no pain with upper extremity or lower extremity range of motion.. No joint swelling Status post left trans-radial amputation, chronic   Assessment/Plan: 1. Functional deficits which require 3+ hours per day of interdisciplinary therapy in a comprehensive inpatient rehab setting. Physiatrist is providing close team supervision and 24 hour management of active medical problems listed below. Physiatrist and rehab team continue to assess barriers to discharge/monitor patient progress toward functional and medical  goals  Care Tool:  Bathing    Body parts bathed by patient: Left arm, Chest, Abdomen, Right upper leg, Left upper leg, Right lower leg, Left lower leg, Face   Body parts bathed by helper: Left arm, Front perineal area, Buttocks     Bathing assist Assist Level: Moderate Assistance - Patient 50 - 74%     Upper Body Dressing/Undressing Upper body dressing   What is the patient wearing?: Hospital gown only, Pull over shirt    Upper body assist Assist Level: Minimal Assistance - Patient > 75%    Lower Body Dressing/Undressing Lower body dressing      What is the patient wearing?: Pants     Lower body assist Assist for lower body dressing: Minimal Assistance - Patient > 75%     Toileting Toileting    Toileting assist Assist for toileting: Dependent - Patient 0%     Transfers Chair/bed transfer  Transfers assist     Chair/bed transfer assist level: Contact Guard/Touching assist     Locomotion Ambulation   Ambulation assist      Assist level: Minimal Assistance - Patient > 75% Assistive device: No Device Max distance: 5'   Walk 10 feet activity   Assist     Assist level: Moderate Assistance - Patient - 50 - 74% (+  2 wc follow) Assistive device: Hand held assist   Walk 50 feet activity   Assist    Assist level: Moderate Assistance - Patient - 50 - 74% Assistive device: Hand held assist    Walk 150 feet activity   Assist Walk 150 feet activity did not occur: Safety/medical concerns         Walk 10 feet on uneven surface  activity   Assist Walk 10 feet on uneven surfaces activity did not occur: Safety/medical concerns         Wheelchair     Assist Will patient use wheelchair at discharge?: No      Wheelchair assist level: Moderate Assistance - Patient 50 - 74% Max wheelchair distance: 61f    Wheelchair 50 feet with 2 turns activity    Assist    Wheelchair 50 feet with 2 turns activity did not occur: Safety/medical  concerns   Assist Level: Maximal Assistance - Patient 25 - 49% (maxA for two turns)   Wheelchair 150 feet activity     Assist  Wheelchair 150 feet activity did not occur: Safety/medical concerns       Blood pressure (!) 117/46, pulse 77, temperature 98.4 F (36.9 C), resp. rate 18, height '5\' 4"'$  (1.626 m), weight 95.4 kg, SpO2 96 %.   Medical Problem List and Plan: 1.  Side weakness with aphasia/dysphagia secondary to right MCA infarction secondary to right M2 occlusion status post mechanical thrombectomy             -patient may shower              -ELOS/Goals: 2.5-3 weeks supervision to min A-     -Continue CIR therapies including PT, OT, and SLP  2.  Antithrombotics: -DVT/anticoagulation: Subcutaneous heparin             -antiplatelet therapy: Aspirin 325 mg daily.  Consider to resume Eliquis 10 to 14 days given large infarct with hemorrhagic transformation as noted per neurology services. 3. Pain Management: Tylenol as needed 4. Mood: Provide emotional support             -antipsychotic agents: Seroquel 25 mg nightly, increase to 50 mg  Sleeping at night currently 5. Neuropsych: This patient is not capable of making decisions on her own behalf. 6. Skin/WounModerate d Care: Routine skin checks  7/17- MASD-  con't Gerhardt's and cream- received 3 doses of  IV Diflucan with improvement 7. Fluids/Electrolytes/Nutrition: Routine in and outs with follow-up chemistries 8.  Seizure prophylaxis.  EEG negative.  Keppra 500 mg twice daily.  Will administer via G-tube 9.  Dysphagia.  NPO.  Alternative means of nutritional support. S/P PEG -continue current bolus feeds 10.  Atrial fibrillation.  Lopressor 25 mg twice daily.  Await plans to resume Eliquis 11.  Hypertension.  Isordil 60 mg twice daily, lisinopril 20 mg daily.  Monitor with increased mobility Vitals:   08/16/20 2015 08/17/20 0324  BP: (!) 158/70 (!) 117/46  Pulse: (!) 104 77  Resp: 18 18  Temp: 97.8 F (36.6 C) 98.4  F (36.9 C)  SpO2: 94% 96%  Blood pressure still labile, due to elevated BUN/creatinine will discontinue lisinopril.  Will need to increase Lopressor  12.  Diabetes mellitus.  Hemoglobin A1c 9.6.  NovoLog 2 units every 4 hours, Lantus insulin 25 units daily.  Check blood sugars before meals and at bedtime CBG (last 3)  Recent Labs    08/16/20 2355 08/17/20 0418 08/17/20 0808  GLUCAP 169* 133* 136*  Improved cont novolog  3U, changed to TID ac now that bolus feeds are starting  Increased lantus to 25U   Controlled 7/26 13.  Hyperlipidemia.  Pravachol 14. Expressive aphasia- needs communication board. 15. Urinary incontinence- having since stroke-likely urgency plus communication issues 16.  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 RUE skin tear- skin care being done- locally 18.  Loose incont stools - persistent - likelyTF/bolus related +/-meds ?pravastatin   -will ask RD for further recs -add fiber and questran to bulk up stool -lomotil prn  LOS: 12 days A FACE TO FACE EVALUATION WAS PERFORMED  Charlett Blake 08/17/2020, 9:16 AM

## 2020-08-17 NOTE — Progress Notes (Signed)
Speech Language Pathology Daily Session Note  Patient Details  Name: Jennifer Lucero MRN: [id number] Date of Birth: [birth year]  Today's Date: 08/17/2020 SLP Individual Time: 1345-1430 SLP Individual Time Calculation (min): 45 min  Short Term Goals: Week 2: SLP Short Term Goal 1 (Week 2): ST=LTGs due to ELOS  Skilled Therapeutic Interventions: Patient agreeable to skilled ST intervention with focus on communication and swallowing goals and family education with spouse. Facilitated oral care with mod A. Re-educated spouse on oral care and techniques including oral care 3x/day using clean oral sponge or toothbrush with minimal moisture, as patient is unable to orally expel secretions. Spouse verbalized understanding through teach back. Facilitated oral motor movements with use of mirror. Patient was able to execute lingual protrusion during 50% of occasions, lingual lateralization x1 toward right side. Unable to perform all other motor movements including closing mouth, pursing lips, or lingual lateralization toward L voluntarily. Patient continues to exhibit tongue protrusion and open mouth posture at rest. Performed single ice chip trial with max A. Patient unable to orally manipulate bolus resulting in anterior labial spillage and need to suction residuals. Patient utilized clean sponge dipped in water throughout session for pleasure. Spouse re-educated on intermittent use of clean sponge and water only for pleasure. Verbalized understanding through teach back. Spouse inquired about patient's ability to use dip tobacco products as she would do frequently at prior level. SLP educated extensively on the risks associated from an oral dysphagia standpoint resulting in increased aspiration and choking risk, and that this was NOT recommended. Spouse verbalized understanding. Educated spouse on communication strategies including encouraging patient to utilize gestures to communicate and asking yes/no questions  vs. Open ended questions. Patient continues to communicate needs though gestures, vocalizations, and nodding her head yes/no. She demonstrates inconsistent use of communication boards 2/2 receptive language deficits and suspected visual impairments. Patient was left in recliner chair with alarm activated and immediate needs within reach at end of session. Spouse at bedside. Continue per current plan of care.      Pain Pain Assessment Pain Scale: 0-10 Pain Score: 0-No pain Faces Pain Scale: Hurts a little bit Pain Location: Abdomen Pain Orientation: Mid  Therapy/Group: Individual Therapy  Patty Sermons 08/17/2020, 3:51 PM

## 2020-08-17 NOTE — Plan of Care (Signed)
  Problem: RH Eating Goal: LTG Patient will perform eating w/assist, cues/equip (OT) Description: LTG: Patient will perform eating with assist, with/without cues using equipment (OT) Outcome: Not Applicable Flowsheets (Taken 08/17/2020 1247) LTG: Pt will perform eating with assistance level of: (Pt NPO) -- Note: NPO   Problem: RH Balance Goal: LTG Patient will maintain dynamic standing with ADLs (OT) Description: LTG:  Patient will maintain dynamic standing balance with assist during activities of daily living (OT)  Flowsheets (Taken 08/17/2020 1247) LTG: Pt will maintain dynamic standing balance during ADLs with: (Goal downgraded 2/2 slower than anticipated progress.) Contact Guard/Touching assist Note: Goal downgraded 2/2 slower than anticipated progress.   Problem: Sit to Stand Goal: LTG:  Patient will perform sit to stand in prep for activites of daily living with assistance level (OT) Description: LTG:  Patient will perform sit to stand in prep for activites of daily living with assistance level (OT) Flowsheets (Taken 08/17/2020 1247) LTG: PT will perform sit to stand in prep for activites of daily living with assistance level: (Goal downgraded 2/2 slower than anticipated progress.) Contact Guard/Touching assist Note: Goal downgraded 2/2 slower than anticipated progress.   Problem: RH Bathing Goal: LTG Patient will bathe all body parts with assist levels (OT) Description: LTG: Patient will bathe all body parts with assist levels (OT) Flowsheets (Taken 08/17/2020 1247) LTG: Pt will perform bathing with assistance level/cueing: (Goal downgraded 2/2 slower than anticipated progress.) Minimal Assistance - Patient > 75% Note: Goal downgraded 2/2 slower than anticipated progress.   Problem: RH Dressing Goal: LTG Patient will perform upper body dressing (OT) Description: LTG Patient will perform upper body dressing with assist, with/without cues (OT). Flowsheets (Taken 08/17/2020 1247) LTG:  Pt will perform upper body dressing with assistance level of: (Goal downgraded 2/2 slower than anticipated progress.) Minimal Assistance - Patient > 75% Goal: LTG Patient will perform lower body dressing w/assist (OT) Description: LTG: Patient will perform lower body dressing with assist, with/without cues in positioning using equipment (OT) Flowsheets (Taken 08/17/2020 1247) LTG: Pt will perform lower body dressing with assistance level of: (Goal downgraded 2/2 slower than anticipated progress.) Minimal Assistance - Patient > 75%   Problem: RH Toileting Goal: LTG Patient will perform toileting task (3/3 steps) with assistance level (OT) Description: LTG: Patient will perform toileting task (3/3 steps) with assistance level (OT)  Flowsheets (Taken 08/17/2020 1247) LTG: Pt will perform toileting task (3/3 steps) with assistance level: (Goal downgraded 2/2 slower than anticipated progress.) Contact Guard/Touching assist Note: Goal downgraded 2/2 slower than anticipated progress.   Problem: RH Toilet Transfers Goal: LTG Patient will perform toilet transfers w/assist (OT) Description: LTG: Patient will perform toilet transfers with assist, with/without cues using equipment (OT) Flowsheets (Taken 08/17/2020 1247) LTG: Pt will perform toilet transfers with assistance level of: (Goal downgraded 2/2 slower than anticipated progress.) Contact Guard/Touching assist Note: Goal downgraded 2/2 slower than anticipated progress.   Problem: RH Tub/Shower Transfers Goal: LTG Patient will perform tub/shower transfers w/assist (OT) Description: LTG: Patient will perform tub/shower transfers with assist, with/without cues using equipment (OT) Flowsheets (Taken 08/17/2020 1247) LTG: Pt will perform tub/shower stall transfers with assistance level of: (Goal downgraded 2/2 slower than anticipated progress.) Contact Guard/Touching assist Note: Goal downgraded 2/2 slower than anticipated progress.

## 2020-08-18 LAB — GLUCOSE, CAPILLARY
Glucose-Capillary: 144 mg/dL — ABNORMAL HIGH (ref 70–99)
Glucose-Capillary: 273 mg/dL — ABNORMAL HIGH (ref 70–99)
Glucose-Capillary: 273 mg/dL — ABNORMAL HIGH (ref 70–99)
Glucose-Capillary: 364 mg/dL — ABNORMAL HIGH (ref 70–99)
Glucose-Capillary: 378 mg/dL — ABNORMAL HIGH (ref 70–99)

## 2020-08-18 MED ORDER — FREE WATER
200.0000 mL | Freq: Four times a day (QID) | Status: DC
  Administered 2020-08-19 (×5): 200 mL

## 2020-08-18 NOTE — Progress Notes (Signed)
Patient ID: Jennifer Lucero, female   DOB: 01-Dec-1950, 70 y.o.   MRN: [id number]  Wheelchair ordered through [place].   SW reached out to [facility] to inquire about nutrition at home. [employer] no longer servicing area. SW will follow up with [employer].   [name], [place]

## 2020-08-18 NOTE — Progress Notes (Signed)
Physical Therapy Session Note  Patient Details  Name: Jennifer Lucero MRN: [id number] Date of Birth: 05-Nov-1950  Today's Date: 08/18/2020 PT Individual Time: 1450-1543 PT Individual Time Calculation (min): 53 min   Short Term Goals: Week 2:  PT Short Term Goal 1 (Week 2): STG = LTG d/t ELOS  Skilled Therapeutic Interventions/Progress Updates:    Pt received sitting in recliner and nods "yes" to therapy.  Pt expresses pain at PEG insertion site. RN made aware and administered pain medication prior to start of session. Re-entry at 2:50pm. Gait belt placed prior to initiation of mobility.  Family education completed this session with husband present.  Husband educated on underhand grip with gait belt for safety with all mobility, techniques to bring awareness towards left environment, and pt status of CGA during all mobility activities to maximize safety.  Husband completed CGA ambulation with pt using SPC x ~269f and ~842f Reminded husband for left sided positioning at all times for safety. Therapist demonstrated 4 6" stair navigation CGA x2 with proper mechanics and positioning for safety. Husband demonstrated understanding x1. Pt completed STS with couch in apartment to simulate height of couch at home with CGA x2. Pt's husband stated increased bed height at home. Pt educated on and demonstrated getting into and out of bed using yoga step x1, however, husband states she prefers to sleep on the couch at home. Pt demonstrated sitting EOB  <> Supine with CGA-MinA for terminal transition of trunk.   Pt sitting in recliner with BLE elevated, brakes locked, seatbelt alarm on, and call bell within reach. No further questions from husband at this time.   Therapy Documentation Precautions:  Precautions Precautions: Fall Precaution Comments: New PEG placement 7/22, global aphasia, L inattention, h/o L wrist disarticulation Restrictions Weight Bearing Restrictions: No  Pain: Pain Assessment Pain  Scale: Faces Pain Score: 0-No pain Faces Pain Scale: Hurts little more Pain Type: Acute pain;Surgical pain Pain Location: Abdomen (PEG placement) Pain Orientation: Mid Pain Intervention(s): Medication (See eMAR);RN made aware (Pain medication administered by RN)   Therapy/Group: Individual Therapy  Ysela Hettinger, SPT  08/18/2020, 3:50 PM

## 2020-08-18 NOTE — Discharge Summary (Signed)
Physician Discharge Summary  Patient ID: Jennifer Lucero MRN: [id number] DOB/AGE: 1950/06/21 70 y.o.  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  Status postgastrostomy tube placement interventional radiology 08/13/2020  Discharged Condition: Stable  Significant Diagnostic Studies: CT ABDOMEN WO CONTRAST  Result Date: 08/11/2020 CLINICAL DATA:  Gastrostomy status EXAM: CT ABDOMEN WITHOUT CONTRAST TECHNIQUE: Multidetector CT imaging of the abdomen was performed following the standard protocol without IV contrast. COMPARISON:  08/26/2004 FINDINGS: Lower chest: No acute abnormality. Hepatobiliary: No focal liver abnormality is seen. No gallstones, gallbladder wall thickening, or biliary dilatation. Pancreas: Unremarkable. No pancreatic ductal dilatation or surrounding inflammatory changes. Spleen: Normal in size without focal abnormality. Adrenals/Urinary Tract: Adrenal glands are unremarkable. Kidneys are normal, without renal calculi, focal lesion, or hydronephrosis. Stomach/Bowel: Enteric tube terminates in the gastric lumen. Anatomy is appropriate for percutaneous gastrostomy tube placement. No dilated loops of bowel identified within the visualized abdomen. Vascular/Lymphatic: No enlarged abdominal lymph nodes. Other: No abdominal wall hernia or abnormality. Musculoskeletal: No acute or significant osseous findings. IMPRESSION: Anatomy appropriate for percutaneous G-tube placement. Electronically Signed   By: Miachel Roux M.D.   On: 08/11/2020 16:25   CT HEAD WO CONTRAST  Result Date: 07/29/2020 CLINICAL DATA:  Nontraumatic seizure EXAM: CT HEAD WITHOUT CONTRAST TECHNIQUE: Contiguous axial  images were obtained from the base of the skull through the vertex without intravenous contrast. COMPARISON:  Yesterday FINDINGS: Brain: Cytotoxic edema seen at the right insula and adjacent frontal white matter. Although not hyperdense there is definite asymmetric increased density of sulci at the right vertex when compared to the left. Remote left MCA territory infarction with encephalomalacia involving the cortex at the insula and adjacent frontal lobe. Asymmetric atrophy of the left cerebral hemisphere. Vascular: No hyperdense vessel or unexpected calcification. Skull: Normal. Negative for fracture or focal lesion. Sinuses/Orbits: No acute finding. Other: IMPRESSION: 1. Right insular and peri-insular acute infarct. 2. Increased subarachnoid density at the right vertex, presumably diluted subarachnoid blood. Electronically Signed   By: Monte Fantasia M.D.   On: 07/29/2020 04:38   CT HEAD WO CONTRAST  Result Date: 07/28/2020 CLINICAL DATA:  Stroke follow-up EXAM: CT HEAD WITHOUT CONTRAST TECHNIQUE: Contiguous axial images were obtained from the base of the skull through the vertex without intravenous contrast. COMPARISON:  Head CT 07/28/2020 FINDINGS: Brain: Old left MCA infarct is unchanged. There is gyral swelling throughout the right MCA territory with mild hyperdensity. No acute hemorrhage. No midline shift or other mass effect. No hydrocephalus. Vascular: Negative Skull: Normal Sinuses/Orbits: Small amount of fluid both mastoids. Minimal maxillary sinus mucosal thickening. Other: None IMPRESSION: 1. Expected evolution of right MCA territory infarct without hemorrhagic conversion or mass effect. 2. Old left MCA infarct. Electronically Signed   By: Ulyses Jarred M.D.   On: 07/28/2020 20:08   MR BRAIN WO CONTRAST  Result Date: 07/29/2020 CLINICAL DATA:  Follow-up examination for acute stroke. EXAM: MRI HEAD WITHOUT CONTRAST TECHNIQUE: Multiplanar, multiecho pulse sequences of the brain and surrounding  structures were obtained without intravenous contrast. COMPARISON:  Prior CT from 07/29/2020. FINDINGS: Brain: Examination mildly degraded by motion artifact. Cerebral volume within normal limits for age. Encephalomalacia and gliosis involving the left frontal lobe consistent with a chronic left MCA distribution infarct. Few  scattered remote lacunar infarcts noted about the basal ganglia and thalami. Underlying mild chronic microvascular ischemic disease. Few scatter remote cerebellar infarcts noted as well. Confluent diffusion abnormality involving the right frontal and parietal lobes consistent with an evolving acute right MCA distribution infarct. Involvement of the right insula noted. Associated prominent susceptibility artifact consistent with petechial hemorrhage. No frank hemorrhagic transformation evident by MRI. Mild localized mass effect without significant midline shift at this time. Basilar cisterns remain patent. No other evidence for acute or subacute ischemia. Gray-white matter differentiation otherwise maintained. No mass lesion or mass effect elsewhere within the brain. No hydrocephalus or extra-axial fluid collection. No made of a partially empty sella. Midline structures intact. Vascular: Major intracranial vascular flow voids are maintained. Skull and upper cervical spine: Craniocervical junction within normal limits. Bone marrow signal intensity normal. No scalp soft tissue abnormality. Sinuses/Orbits: Patient status post bilateral ocular lens replacement. Globes and orbital soft tissues demonstrate no acute finding. Scattered mucosal thickening noted throughout the paranasal sinuses. Small to moderate bilateral mastoid effusions noted, left greater than right. Trace layering fluid noted within the nasopharynx. Other: None. IMPRESSION: 1. Evolving acute to early subacute right MCA distribution infarct as above. Associated petechial hemorrhage without frank hemorrhagic transformation or  significant mass effect. 2. Underlying chronic left frontal and cerebellar infarcts, with mild chronic microvascular ischemic disease. Electronically Signed   By: Jeannine Boga M.D.   On: 07/29/2020 20:50   IR GASTROSTOMY TUBE MOD SED  Result Date: 08/13/2020 INDICATION: 70 year old with history of CVA and dysphagia. Dysphagia and poor oral intake. EXAM: PERCUTANEOUS GASTROSTOMY TUBE WITH FLUOROSCOPIC GUIDANCE Physician: Stephan Minister. Anselm Pancoast, MD MEDICATIONS: Ancef 2 g; Antibiotics were administered within 1 hour of the procedure. Glucagon 1 mg IV ANESTHESIA/SEDATION: Versed 1.0 mg IV; Fentanyl 50 mcg IV Moderate Sedation Time:  15 The patient was continuously monitored during the procedure by the interventional radiology nurse under my direct supervision. FLUOROSCOPY TIME:  Fluoroscopy Time: 2.36 minutes, 36 mGy COMPLICATIONS: None immediate. PROCEDURE: Informed consent was obtained for a percutaneous gastrostomy tube. The patient was placed on the interventional table. An orogastric tube was placed with fluoroscopic guidance. The existing nasogastric tube was removed. The anterior abdomen was prepped and draped in sterile fashion. Maximal barrier sterile technique was utilized including caps, mask, sterile gowns, sterile gloves, sterile drape, hand hygiene and skin antiseptic. Stomach was inflated with air through the orogastric tube. The skin and subcutaneous tissues were anesthetized with 1% lidocaine. A 17 gauge needle was directed into the distended stomach with fluoroscopic guidance. A wire was advanced into the stomach and a T-tact was deployed. A 9-French vascular sheath was placed and the orogastric tube was snared using a Gooseneck snare device. The orogastric tube and snare were pulled out of the patient's mouth. The snare device was connected to a 20-French gastrostomy tube. The snare device and gastrostomy tube were pulled through the patient's mouth and out the anterior abdominal wall. The  gastrostomy tube was cut to an appropriate length. Contrast injection through gastrostomy tube confirmed placement within the stomach. Fluoroscopic images were obtained for documentation. The gastrostomy tube was flushed with normal saline. IMPRESSION: Successful fluoroscopic guided percutaneous gastrostomy tube placement. Electronically Signed   By: Markus Daft M.D.   On: 08/13/2020 14:25   IR CT Head Ltd  Result Date: 07/30/2020 INDICATION: New onset left-sided weakness and left-sided neglect. Proximal occlusion of the superior division of the right middle cerebral artery. EXAM: 1. EMERGENT LARGE VESSEL OCCLUSION THROMBOLYSIS (anterior CIRCULATION) COMPARISON:  CT angiogram of the head and neck of 07/28/2020. MEDICATIONS: Ancef 2 g IV antibiotic was administered within 1 hour of the procedure. ANESTHESIA/SEDATION: General anesthesia. CONTRAST:  Isovue 300 approximately 90 mL. FLUOROSCOPY TIME:  Fluoroscopy Time: 22 minutes 24 seconds (2617 mGy). COMPLICATIONS: None immediate. TECHNIQUE: An emergent consent was obtained with 2 physicians as no next of kin, or family members were immediately available physically or via telephone. The patient was then put under general anesthesia by the Department of Anesthesiology at [facility]. The right groin was prepped and draped in the usual sterile fashion. Thereafter using modified Seldinger technique, transfemoral access into the right common femoral artery was obtained without difficulty. Over a 0.035 inch guidewire an 8 French 25 cm Pinnacle sheath was inserted. Through this, and also over a 0.035 inch guidewire a combination of a 5.5 French 125 cm Berenstein catheter inside of an 087 balloon catheter was advanced to the aortic arch region and selectively positioned in the right common carotid artery. FINDINGS: The right common carotid arteriogram demonstrates the right external carotid artery and its major branches to be widely patent. The right internal  carotid artery at the bulb to the cranial skull base is widely patent. The petrous, the cavernous and the supraclinoid segments are widely patent. The right anterior cerebral artery opacifies into the capillary and venous phases. The right middle cerebral artery demonstrates patency of the right M1 segment and the inferior division. The superior division demonstrates the stump of an occluded superior division. Right posterior communicating artery transiently opacifies the right posterior cerebral artery distribution. PROCEDURE: The balloon guide was then advanced to the distal cervical right ICA over an 0.035 inch Roadrunner guidewire. The guidewire was removed. Good aspiration obtained from hub of the balloon catheter. A gentle control arteriogram demonstrated no evidence of spasms dissections or of intraluminal filling defects. Over a 0.014 inch standard Synchro micro guidewire, a combination of a 55 136 cm Zoom aspiration catheter inside of which was an 021 Trevo Trak microcatheter was advanced to the supraclinoid right ICA. Using a torque device, wire access was obtained into the right middle cerebral artery and superior division into the M2 M3 region followed by the microcatheter. The guidewire was removed. Good aspiration was obtained from the hub of the microcatheter. A gentle control arteriogram performed through the microcatheter demonstrated brisk opacification distal to the microcatheter. A 4 mm x 40 mm Solitaire X retrieval device was then advanced to the distal end of the micro guidewire. The O ring on the delivery microcatheter was loosened. With slight forward gentle traction with the right hand on the delivery micro guidewire, with the left hand the delivery microcatheter was retrieved deploying the retrieval device. The Zoom aspiration catheter was then advanced to the origin of the superior division. With proximal flow arrest in the right internal carotid artery, and constant aspiration applied at  the hub of the balloon guide catheter with a 20 mL syringe and at the hub of the Zoom aspiration catheter with Penumbra pump for approximately 2-1/2 minutes, the combination of the retrieval device, the microcatheter and the aspiration catheter were retrieved and removed. Following reversal of flow arrest, a control arteriogram performed through the balloon guide in the right internal carotid artery demonstrated opacification at the origin of the inferior superior division. However, there continued to be significant stenosis in the M2 superior division distally. At least 4 aliquots of nitroglycerin was then infused intra-arterially to relieve the suspected vasospasm. The vessel, however, occluded at  the origin. A combination of a Zoom 55 aspiration catheter with an 021 156 cm 2 tip Headway microcatheter was then advanced over a 0.014 inch standard Synchro micro guidewire to the proximal right middle cerebral artery. The guidewire was then gently manipulated through the occluded superior division into the M2 M3 region followed by the microcatheter. The guidewire was removed. Slow aspiration was noted at the hub of the microcatheter. This improved with slight retraction of the microcatheter tip. Control arteriogram performed through the microcatheter demonstrated safe position of the tip of the microcatheter. This was then connected to continuous heparinized saline infusion. A 3 mm x 40 mm Solitaire X retrieval device was then advanced to the distal end of the microcatheter. The retrieval device was deployed in the usual manner as described earlier. Proximal flow arrest was initiated, and the Zoom aspiration catheter was engaged at the origin of the inferior division. After 2-1/2 minutes, the combination of the retrieval device, the Zoom aspiration catheter and the microcatheter were retrieved and removed. Flow arrest was reversed. A control arteriogram performed through the balloon guide catheter in the distal right  internal carotid artery now demonstrated complete revascularization of the superior division. A TICI 3 revascularization was achieved. The right anterior cerebral artery remained widely patent. Also the right posterior communicating artery remained widely patent. A final control arteriogram performed through the balloon guide catheter in the right common carotid artery continued to demonstrate wide patency of the internal carotid artery extra cranially and intracranially with no change in the right middle and the right anterior cerebral artery distributions. Guide catheter were removed. The 8 French sheath was removed with hemostasis achieved at the right common femoral puncture site with an 8 French Angio-Seal closure device. The right groin appeared soft. Distal pulses remained Dopplerable in both feet. Post CT of the brain demonstrated no evidence of intracranial hemorrhage or mass effect or midline shift. Patient's general anesthesia was then reversed. Upon recovery, the patient was noted to move the right arm and leg spontaneously. She was just about able to bend the left knee to simple commands. However, there was no motion noted in the left upper extremity. Patient was then transferred to the neuro ICU for post revascularization care. IMPRESSION: Status post endovascular revascularization of proximally occluded superior division of the right middle cerebral artery with 1 pass with a 4 mm x 40 mm Solitaire X retrieval device and contact aspiration, 1 pass with a 3 mm x 40 mm Solitaire X retrieval device with contact aspiration, achieving a TICI 3 revascularization. PLAN: Follow-up as per referring MD. Electronically Signed   By: Luanne Bras M.D.   On: 07/29/2020 12:30   DG CHEST PORT 1 VIEW  Result Date: 07/30/2020 CLINICAL DATA:  Leukocytosis. EXAM: PORTABLE CHEST 1 VIEW COMPARISON:  CT of the chest from 2019 and chest x-ray from October of 2020. FINDINGS: Image rotated slightly to the RIGHT.  Cardiac silhouette may be slightly enlarged though is accentuated by portable technique. Signs of pulmonary vascular congestion. No lobar consolidative changes. Subtle opacity noted in the retrocardiac region. No visible pneumothorax or sign of pleural effusion on frontal radiograph. On limited assessment no acute skeletal process. IMPRESSION: 1. Signs of pulmonary vascular congestion. 2. Subtle opacity in the retrocardiac region may represent atelectasis or developing infiltrate. Electronically Signed   By: Zetta Bills M.D.   On: 07/30/2020 07:58   DG Abd Portable 1V  Result Date: 08/06/2020 CLINICAL DATA:  Enteric catheter placement EXAM: PORTABLE ABDOMEN -  1 VIEW COMPARISON:  07/30/2020 FINDINGS: Semi-erect frontal view of the lower chest and upper abdomen demonstrates enteric catheter passing below diaphragm, weighted tip overlying the gastric body. Cardiac silhouette is unremarkable. Lungs are clear. IMPRESSION: 1. Enteric catheter tip projecting over gastric body. Electronically Signed   By: Randa Ngo M.D.   On: 08/06/2020 15:46   DG Abd Portable 1V  Result Date: 07/30/2020 CLINICAL DATA:  Feeding tube placement EXAM: PORTABLE ABDOMEN - 1 VIEW COMPARISON:  03/12/2020 FINDINGS: Limited radiograph of the lower chest and upper abdomen was obtained for the purposes of enteric tube localization. Enteric tube is seen coursing below the diaphragm with distal tip and side port terminating within the expected location of the gastric body. IVC filter is present. Cholecystectomy clips. IMPRESSION: Enteric tube terminates within the expected location of the gastric body. Electronically Signed   By: Davina Poke D.O.   On: 07/30/2020 11:38   EEG adult  Result Date: 07/29/2020 Lora Havens, MD     07/29/2020 10:20 AM Patient Name: Jennifer Lucero MRN: [id number] Epilepsy Attending: Lora Havens Referring Physician/Provider: Tollie Eth, NP Date: 07/29/2020 Duration: 24 mins Patient  history: 70 year old female with right M2 occlusion in the setting of atrial fibrillation.  Had seizure-like episode overnight.  EEG to evaluate for seizures. Level of alertness: Awake AEDs during EEG study: LEV Technical aspects: This EEG study was done with scalp electrodes positioned according to the 10-20 International system of electrode placement. Electrical activity was acquired at a sampling rate of '500Hz'$  and reviewed with a high frequency filter of '70Hz'$  and a low frequency filter of '1Hz'$ . EEG data were recorded continuously and digitally stored. Description: No posterior dominant rhythm seen.  EEG showed continuous generalized and lateralized right hemisphere 3-5 Hz theta-delta slowing. Hyperventilation and photic stimulation were not peRight hemisphere.  rformed.   ABNORMALITY - Continuous slow, generalized and lateralized right hemisphere IMPRESSION: This study is suggestive of cortical dysfunction arising from right hemisphere, likely secondary to underlying structural abnormality/stroke. Additionally, there is moderate diffuse encephalopathy, nonspecific etiology. No seizures or epileptiform discharges were seen throughout the recording. If concern for ictal-interictal activity persists, consider long term monitoring. Lora Havens   ECHOCARDIOGRAM COMPLETE  Result Date: 07/29/2020    ECHOCARDIOGRAM REPORT   Patient Name:   Jennifer Lucero Date of Exam: 07/29/2020 Medical Rec #:  [id number]       Height:       64.0 in Accession #:    [id number]      Weight:       216.0 lb Date of Birth:  02-10-1950       BSA:          2.022 m Patient Age:    62 years        BP:           137/125 mmHg Patient Gender: F               HR:           103 bpm. Exam Location:  Inpatient Procedure: 2D Echo, Color Doppler and Cardiac Doppler Indications:    Stroke  History:        Patient has no prior history of Echocardiogram examinations.                 Risk Factors:Morbid obesity and Hypertension. Stroke                  434.91/I163.9.  Sonographer:  Merrie Roof RDCS Referring Phys: [phone] MCNEILL P Radnor  1. Left ventricular ejection fraction, by estimation, is 55 to 60%. The left ventricle has normal function. The left ventricle has no regional wall motion abnormalities. Left ventricular diastolic parameters are consistent with Grade I diastolic dysfunction (impaired relaxation).  2. Right ventricular systolic function is normal. The right ventricular size is normal. Tricuspid regurgitation signal is inadequate for assessing PA pressure.  3. Left atrial size was mildly dilated.  4. Right atrial size was mildly dilated.  5. The mitral valve is normal in structure. No evidence of mitral valve regurgitation. No evidence of mitral stenosis.  6. The aortic valve is tricuspid. Aortic valve regurgitation is not visualized. Mild aortic valve sclerosis is present, with no evidence of aortic valve stenosis.  7. The inferior vena cava is normal in size with greater than 50% respiratory variability, suggesting right atrial pressure of 3 mmHg. FINDINGS  Left Ventricle: Left ventricular ejection fraction, by estimation, is 55 to 60%. The left ventricle has normal function. The left ventricle has no regional wall motion abnormalities. The left ventricular internal cavity size was normal in size. There is  no left ventricular hypertrophy. Left ventricular diastolic parameters are consistent with Grade I diastolic dysfunction (impaired relaxation). Right Ventricle: The right ventricular size is normal. No increase in right ventricular wall thickness. Right ventricular systolic function is normal. Tricuspid regurgitation signal is inadequate for assessing PA pressure. Left Atrium: Left atrial size was mildly dilated. Right Atrium: Right atrial size was mildly dilated. Pericardium: There is no evidence of pericardial effusion. Mitral Valve: The mitral valve is normal in structure. There is mild thickening of the mitral valve  leaflet(s). No evidence of mitral valve regurgitation. No evidence of mitral valve stenosis. Tricuspid Valve: The tricuspid valve is normal in structure. Tricuspid valve regurgitation is not demonstrated. Aortic Valve: The aortic valve is tricuspid. Aortic valve regurgitation is not visualized. Mild aortic valve sclerosis is present, with no evidence of aortic valve stenosis. Aortic valve mean gradient measures 9.0 mmHg. Aortic valve peak gradient measures 16.8 mmHg. Aortic valve area, by VTI measures 1.47 cm. Pulmonic Valve: The pulmonic valve was normal in structure. Pulmonic valve regurgitation is trivial. Aorta: The aortic root is normal in size and structure. Venous: The inferior vena cava is normal in size with greater than 50% respiratory variability, suggesting right atrial pressure of 3 mmHg. IAS/Shunts: No atrial level shunt detected by color flow Doppler.  LEFT VENTRICLE PLAX 2D LVIDd:         4.10 cm  Diastology LVIDs:         3.00 cm  LV e' medial:    9.68 cm/s LV PW:         0.90 cm  LV E/e' medial:  9.9 LV IVS:        1.00 cm  LV e' lateral:   16.80 cm/s LVOT diam:     1.90 cm  LV E/e' lateral: 5.7 LV SV:         50 LV SV Index:   25 LVOT Area:     2.84 cm  RIGHT VENTRICLE RV Basal diam:  4.00 cm RV Mid diam:    3.50 cm LEFT ATRIUM             Index       RIGHT ATRIUM           Index LA diam:        3.20 cm 1.58 cm/m  RA  Area:     19.40 cm LA Vol (A2C):   70.1 ml 34.68 ml/m RA Volume:   58.60 ml  28.99 ml/m LA Vol (A4C):   81.6 ml 40.36 ml/m LA Biplane Vol: 78.4 ml 38.78 ml/m  AORTIC VALVE AV Area (Vmax):    1.42 cm AV Area (Vmean):   1.43 cm AV Area (VTI):     1.47 cm AV Vmax:           205.00 cm/s AV Vmean:          141.000 cm/s AV VTI:            0.342 m AV Peak Grad:      16.8 mmHg AV Mean Grad:      9.0 mmHg LVOT Vmax:         103.00 cm/s LVOT Vmean:        71.300 cm/s LVOT VTI:          0.177 m LVOT/AV VTI ratio: 0.52  AORTA Ao Root diam: 3.00 cm Ao Asc diam:  3.30 cm MITRAL VALVE MV  Area (PHT): 4.29 cm     SHUNTS MV Decel Time: 177 msec     Systemic VTI:  0.18 m MV E velocity: 95.90 cm/s   Systemic Diam: 1.90 cm MV A velocity: 136.00 cm/s MV E/A ratio:  0.71 Loralie Champagne MD Electronically signed by Loralie Champagne MD Signature Date/Time: 07/29/2020/4:23:49 PM    Final    IR PERCUTANEOUS ART THROMBECTOMY/INFUSION INTRACRANIAL INC DIAG ANGIO  Result Date: 07/30/2020 INDICATION: New onset left-sided weakness and left-sided neglect. Proximal occlusion of the superior division of the right middle cerebral artery. EXAM: 1. EMERGENT LARGE VESSEL OCCLUSION THROMBOLYSIS (anterior CIRCULATION) COMPARISON:  CT angiogram of the head and neck of 07/28/2020. MEDICATIONS: Ancef 2 g IV antibiotic was administered within 1 hour of the procedure. ANESTHESIA/SEDATION: General anesthesia. CONTRAST:  Isovue 300 approximately 90 mL. FLUOROSCOPY TIME:  Fluoroscopy Time: 22 minutes 24 seconds (2617 mGy). COMPLICATIONS: None immediate. TECHNIQUE: An emergent consent was obtained with 2 physicians as no next of kin, or family members were immediately available physically or via telephone. The patient was then put under general anesthesia by the Department of Anesthesiology at [facility]. The right groin was prepped and draped in the usual sterile fashion. Thereafter using modified Seldinger technique, transfemoral access into the right common femoral artery was obtained without difficulty. Over a 0.035 inch guidewire an 8 French 25 cm Pinnacle sheath was inserted. Through this, and also over a 0.035 inch guidewire a combination of a 5.5 French 125 cm Berenstein catheter inside of an 087 balloon catheter was advanced to the aortic arch region and selectively positioned in the right common carotid artery. FINDINGS: The right common carotid arteriogram demonstrates the right external carotid artery and its major branches to be widely patent. The right internal carotid artery at the bulb to the cranial skull base  is widely patent. The petrous, the cavernous and the supraclinoid segments are widely patent. The right anterior cerebral artery opacifies into the capillary and venous phases. The right middle cerebral artery demonstrates patency of the right M1 segment and the inferior division. The superior division demonstrates the stump of an occluded superior division. Right posterior communicating artery transiently opacifies the right posterior cerebral artery distribution. PROCEDURE: The balloon guide was then advanced to the distal cervical right ICA over an 0.035 inch Roadrunner guidewire. The guidewire was removed. Good aspiration obtained from hub of the balloon catheter. A gentle control  arteriogram demonstrated no evidence of spasms dissections or of intraluminal filling defects. Over a 0.014 inch standard Synchro micro guidewire, a combination of a 55 136 cm Zoom aspiration catheter inside of which was an 021 Trevo Trak microcatheter was advanced to the supraclinoid right ICA. Using a torque device, wire access was obtained into the right middle cerebral artery and superior division into the M2 M3 region followed by the microcatheter. The guidewire was removed. Good aspiration was obtained from the hub of the microcatheter. A gentle control arteriogram performed through the microcatheter demonstrated brisk opacification distal to the microcatheter. A 4 mm x 40 mm Solitaire X retrieval device was then advanced to the distal end of the micro guidewire. The O ring on the delivery microcatheter was loosened. With slight forward gentle traction with the right hand on the delivery micro guidewire, with the left hand the delivery microcatheter was retrieved deploying the retrieval device. The Zoom aspiration catheter was then advanced to the origin of the superior division. With proximal flow arrest in the right internal carotid artery, and constant aspiration applied at the hub of the balloon guide catheter with a 20 mL  syringe and at the hub of the Zoom aspiration catheter with Penumbra pump for approximately 2-1/2 minutes, the combination of the retrieval device, the microcatheter and the aspiration catheter were retrieved and removed. Following reversal of flow arrest, a control arteriogram performed through the balloon guide in the right internal carotid artery demonstrated opacification at the origin of the inferior superior division. However, there continued to be significant stenosis in the M2 superior division distally. At least 4 aliquots of nitroglycerin was then infused intra-arterially to relieve the suspected vasospasm. The vessel, however, occluded at the origin. A combination of a Zoom 55 aspiration catheter with an 021 156 cm 2 tip Headway microcatheter was then advanced over a 0.014 inch standard Synchro micro guidewire to the proximal right middle cerebral artery. The guidewire was then gently manipulated through the occluded superior division into the M2 M3 region followed by the microcatheter. The guidewire was removed. Slow aspiration was noted at the hub of the microcatheter. This improved with slight retraction of the microcatheter tip. Control arteriogram performed through the microcatheter demonstrated safe position of the tip of the microcatheter. This was then connected to continuous heparinized saline infusion. A 3 mm x 40 mm Solitaire X retrieval device was then advanced to the distal end of the microcatheter. The retrieval device was deployed in the usual manner as described earlier. Proximal flow arrest was initiated, and the Zoom aspiration catheter was engaged at the origin of the inferior division. After 2-1/2 minutes, the combination of the retrieval device, the Zoom aspiration catheter and the microcatheter were retrieved and removed. Flow arrest was reversed. A control arteriogram performed through the balloon guide catheter in the distal right internal carotid artery now demonstrated complete  revascularization of the superior division. A TICI 3 revascularization was achieved. The right anterior cerebral artery remained widely patent. Also the right posterior communicating artery remained widely patent. A final control arteriogram performed through the balloon guide catheter in the right common carotid artery continued to demonstrate wide patency of the internal carotid artery extra cranially and intracranially with no change in the right middle and the right anterior cerebral artery distributions. Guide catheter were removed. The 8 French sheath was removed with hemostasis achieved at the right common femoral puncture site with an 8 French Angio-Seal closure device. The right groin appeared soft. Distal pulses remained  Dopplerable in both feet. Post CT of the brain demonstrated no evidence of intracranial hemorrhage or mass effect or midline shift. Patient's general anesthesia was then reversed. Upon recovery, the patient was noted to move the right arm and leg spontaneously. She was just about able to bend the left knee to simple commands. However, there was no motion noted in the left upper extremity. Patient was then transferred to the neuro ICU for post revascularization care. IMPRESSION: Status post endovascular revascularization of proximally occluded superior division of the right middle cerebral artery with 1 pass with a 4 mm x 40 mm Solitaire X retrieval device and contact aspiration, 1 pass with a 3 mm x 40 mm Solitaire X retrieval device with contact aspiration, achieving a TICI 3 revascularization. PLAN: Follow-up as per referring MD. Electronically Signed   By: Luanne Bras M.D.   On: 07/29/2020 12:30    Labs:  Basic Metabolic Panel: Recent Labs  Lab 08/17/20 0516  NA 139  K 4.5  CL 104  CO2 23  GLUCOSE 139*  BUN 47*  CREATININE 1.34*  CALCIUM 9.0    CBC: Recent Labs  Lab 08/17/20 0516  WBC 10.7*  HGB 10.2*  HCT 33.1*  MCV 96.8  PLT 423*    CBG: Recent Labs   Lab 08/19/20 1157 08/19/20 1703 08/19/20 1958 08/19/20 2346 08/20/20 0405  GLUCAP 351* 219* 290* 249* 228*   Family history.  Mother with CAD Brother with diabetes.  Denies any colon cancer esophageal cancer or rectal cancer  Brief HPI:   Jennifer Lucero is a 70 y.o. right-handed female with history of atrial fibrillation reportedly on Eliquis, diabetes mellitus hypertension as well as hyperlipidemia.  Per chart review lives with spouse independent prior to admission.  [facility]/28/[facility] with acute onset of left-sided weakness and rightward gaze as well as aphasia.  Cranial CT scan showed right insular and periinsular acute infarction as well as old left MCA infarction.  Patient did not receive tPA.  CT angiogram demonstrated right M2 occlusion.  Patient was transferred to [facility] underwent mechanical thrombectomy per interventional radiology.  She did remain intubated for a short time.  MRI follow-up showed evolving acute to early subacute right MCA distribution infarction associated petechial hemorrhage.  On 07/29/2020 around 3 AM there was question seizure activity EEG completed showing no seizure moderate diffuse encephalopathy maintained and loaded with Keppra.  Echocardiogram with ejection fraction of 55 to 123456 grade 1 diastolic dysfunction.  Neurology follow-up maintained on aspirin 325 mg daily for CVA prophylaxis.  Her Eliquis remained on hold given large right MCA infarction with hemorrhagic transformation awaiting to resume approximately 10 to 14 days with her Eliquis.  She was cleared to begin subcutaneous heparin for DVT prophylaxis.  N.p.o. alternative means of nutritional support.  Bouts of agitation and restlessness maintained on Seroquel.  Therapy evaluations completed due to patient's left-sided weakness was admitted for a comprehensive rehab program.   Hospital Course: Jennifer Lucero was admitted to rehab 08/05/2020 for inpatient therapies to consist  of PT, ST and OT at least three hours five days a week. Past admission physiatrist, therapy team and rehab RN have worked together to provide customized collaborative inpatient rehab.  Pertaining to patient's right MCA infarction secondary to right M2 occlusion status post mechanical thrombectomy.  Initially maintained on aspirin 325 mg daily Eliquis initially held due to size of infarction later resumed no bleeding episodes.  She had been cleared initially for subcutaneous  heparin for DVT prophylaxis.  Patient would follow-up neurology services.  Noted dysphagia initially n.p.o. nasogastric tube feeds she underwent placement of gastrostomy tube 08/13/2020 per interventional radiology.  Her tube feeds were adjusted due to some loose stools.  She continued on Keppra for seizure prophylaxis no seizure activity EEG negative.  History of atrial fibrillation Lopressor as indicated her Eliquis was resumed cardiac rate controlled.  Blood pressure controlled and monitored on Isordil as well as lisinopril with close monitoring of renal function latest creatinine 1.34.  Mood stabilization with Seroquel she was attending therapies.  Hemoglobin A1c 9.6 insulin therapy as directed close monitoring while on tube feeds.  Pravachol for hyperlipidemia.  She did have some mild urinary incontinence since CVA likely urgency plus communication issues related to her aphasia secondary to stroke with routine toileting provided.   Blood pressures were monitored on TID basis and soft and monitored  Diabetes has been monitored with ac/hs CBG checks and SSI was use prn for tighter BS control.    Rehab course: During patient's stay in rehab weekly team conferences were held to monitor patient's progress, set goals and discuss barriers to discharge. At admission, patient required moderate assist 20 feet ambulation minimal assist sit to stand minimal assist sit to supine total assist upper body bathing total assist lower body bathing total  assist upper and lower body dressing  Physical exam.  Blood pressure 118/47 pulse 71 temperature 98.4 respiration 20 oxygen saturation 94% room air Constitutional.  No acute distress HEENT Head.  Normocephalic and atraumatic.  Tongue midline. Eyes.  Pupils round and reactive to light right gaze preference Neck.  Supple nontender no JVD without thyromegaly Cardiac irregular irregular Abdomen.  Soft nontender positive bowel sounds without rebound Respiratory effort normal no respiratory distress without wheeze Skin.  Warm and dry Musculoskeletal.  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  Alert makes eye contact with examiner follows commands expressive aphasia nonverbal.  Intact light touch all 4 extremities  He/She  has had improvement in activity tolerance, balance, postural control as well as ability to compensate for deficits. He/She has had improvement in functional use RUE/LUE  and RLE/LLE as well as improvement in awareness.  ADLs sessions focused on self-care retraining activity tolerance functional transfers command follow in preparation for improved ADLs mobility.  STS with contact-guard assist and ambulates to and from toilet with contact-guard straight point cane.  Ambulates back to wheelchair.  Bathe upper body and minimal assist to bathe right upper extremity and moderate verbal cues to bathe left upper extremity bathe lower body minus PERI area with assistance.  Donned pants with minimal assist for correct orientation and to pull over hips.  Washed at sink with use of hair washing tray.  Ambulation training contact-guard assist without assistive device increasing up to 50 feet.   Speech therapy follow-up for aphasia with communication as well as dysphagia working with PEG tube feeds.  Full family teaching completed plan discharged to home       Disposition: Discharged to home    Diet: Osmolite 237 mL 3 times daily.  Free water 200 mL 3 times daily  Special Instructions: No smoking or alcohol  Abdominal binder to protect PEG tube  Medications at discharge 1.  Tylenol per tube as needed 2.  Eliquis 5 mg per tube twice daily 3.  Lomotil 5 mL per tube 4 times daily as needed loose stools 4.  Lantus insulin 25 units daily 5.  Isordil 60 mg twice daily by tube 6.  Keppra 500 mg twice daily by tube 7.  Lisinopril 20 mg daily by tube 8.  Lopressor 25 mg twice daily by tube 9  Multivitamin daily by tube 10.  Pravachol 10 mg daily by tube 11.  Seroquel 50 mg daily at bedtime by tube  30-35 minutes spent completing discharge summary and discharge planning  Discharge Instructions     Ambulatory referral to Neurology   Complete by: As directed    Follow up with stroke clinic NP (Jessica Vanschaick or Cecille Rubin, if both not available, consider Zachery Dauer, or Ahern) at [facility] in about 4 weeks. Thanks.   Ambulatory referral to Physical Medicine Rehab   Complete by: As directed    Moderate complexity follow-up 1 to 2 weeks right MCA infarction        Follow-up Information     [facility]. Schedule an appointment as soon as possible for a visit in 1 month(s).   Specialty: Neurology Why: stroke clinic Contact information: [place] [phone]        Kirsteins, Luanna Salk, MD Follow up.   Specialty: Physical Medicine and Rehabilitation Why: Office to call for appointment Contact information: [place] [address] [phone]         Luanne Bras, MD Follow up.   Specialties: Interventional Radiology, Radiology Why: Call for  appointment Contact information: [place] [address] [phone]         Markus Daft, MD Follow up.   Specialties: Interventional Radiology, Radiology Why: Call for appointment as needed in regards to issues with gastrostomy tube Contact information: [address] [place] [address] [phone]                 Signed: Lavon Paganini Burleigh 08/20/2020, 5:23 AM

## 2020-08-18 NOTE — Patient Care Conference (Signed)
Inpatient RehabilitationTeam Conference and Plan of Care Update Date: 08/18/2020   Time: 10:19 AM    Patient Name: Jennifer Lucero      Medical Record Number: [id number]  Date of Birth: [birth year] Sex: Female         Room/Bed: 4W12C/4W12C-02 Payor Info: Payor: [employer] MEDICARE / Plan: [facility] MEDICARE / Product Type: *No Product type* /    Admit Date/Time:  08/05/2020  2:59 PM  Primary Diagnosis:  Right middle cerebral artery stroke [facility])  Hospital Problems: Principal Problem:   Right middle cerebral artery stroke [facility]) Active Problems:   Aphasia   Dysphagia   Left hemiparesis [facility])    Expected Discharge Date: Expected Discharge Date: 08/20/20  Team Members Present: Physician leading conference: Dr. Alysia Penna Social Worker Present: Erlene Quan, BSW Nurse Present: Dorien Chihuahua, RN PT Present: Barrie Folk, PT OT Present: Providence Lanius, OT SLP Present: Sherren Kerns, SLP PPS Coordinator present : Gunnar Fusi, SLP     Current Status/Progress Goal Weekly Team Focus  Bowel/Bladder             Swallow/Nutrition/ Hydration   Severe-to-profound oral motor deficits impacting ability to safely participate in PO trials. NPO with long-term source of nutrition  mod A  focusing on oral care, family education   ADL's   S for grooming, min A for UBB, UBD, min A LBB, LBD; min to CGA for toileting, CGA for amb toilet transfer with [facility]; family education completed, slowed progress 2/2 freq loose stools and abdominal pain  downgraded to min A ADL, CGA transfers  selfcare retraining, transfer training, balance retraining, cognitive retraining, therapeutic activites, pt/family education   Mobility             Communication   mod A for communicating functional needs through non-verbal means, mod A for use of simple communication board with limited field, max A for reading  Mod A  Family education, continue training with communicaiton board and multimodal  communication   Safety/Cognition/ Behavioral Observations            Pain             Skin               Discharge Planning:  Discharging home with spouse (Family education scheduled)   Team Discussion: PEG placed and now with bolus feeds and medication via PEG. Training with spouse on care of PEG and nutritional means, medication administration,etc. Note agitation at times due to frustration with expressive and receptive issues. Progress limited by abdominal pain and bowel management issues.  Patient on target to meet rehab goals: Currently requires min assist for bathing and dressing and CGA for hygiene. Requires CGA for toilet transfers and supervision for transfers . Can be min guard without an assistive device. Mod assist goals for communication  and comprehension; using gestures and sounds.  *See Care Plan and progress notes for long and short-term goals.   Revisions to Treatment Plan:  Working on multimodal techniques , use of a communication board, oral care, and use of a SP cane. Downgraded goals to min assist overall Teaching Needs: Transfers, safety, PEG care, nutrition/medication administration, etc.  Current Barriers to Discharge: Decreased caregiver support, Home enviroment access/layout, Incontinence, and Nutritional means  Possible Resolutions to Barriers: Family education ongoing with spouse     Medical Summary Current Status: PEG place, transition to bolus, diarrhea  Barriers to Discharge: Medical stability   Possible Resolutions to [employer] Focus: Family training for PEG  placement, improve tolerance of TF   Continued Need for Acute Rehabilitation Level of Care: The patient requires daily medical management by a physician with specialized training in physical medicine and rehabilitation for the following reasons: Direction of a multidisciplinary physical rehabilitation program to maximize functional independence : Yes Medical management of patient  stability for increased activity during participation in an intensive rehabilitation regime.: Yes Analysis of laboratory values and/or radiology reports with any subsequent need for medication adjustment and/or medical intervention. : Yes   I attest that I was present, lead the team conference, and concur with the assessment and plan of the team.   Dorien Chihuahua B 08/18/2020, 4:05 PM

## 2020-08-18 NOTE — Progress Notes (Signed)
Patient ID: Jennifer Lucero, female   DOB: October 04, 1950, 70 y.o.   MRN: [id number] Team Conference Report to Patient/Family  Team Conference discussion was reviewed with the patient and caregiver, including goals, any changes in plan of care and target discharge date.  Patient and caregiver express understanding and are in agreement.  The patient has a target discharge date of 08/20/20.  SW met with patient and spouse at bedside. Provided conference updates. Spouse has another session of family edu today and reports it is going well. Spouse requesting [facility], SW will follow up. SW will follow up with getting patient bolus for d/c.   [name] 08/18/2020, 1:44 PM

## 2020-08-18 NOTE — Progress Notes (Signed)
Occupational Therapy Session Note  Patient Details  Name: Jennifer Lucero MRN: [id number] Date of Birth: 03-05-1950  Today's Date: 08/18/2020 OT Individual Time: 0973-5329 OT Individual Time Calculation (min): 53 min + 38 min   Short Term Goals: Week 2:  OT Short Term Goal 1 (Week 2): STG = LTG 2/2 ELOS  Skilled Therapeutic Interventions/Progress Updates:     Session 1 ([id number]): Pt received asleep in bed with husband and LPN present, agreeable to therapy with encouragement. Mod tactile and verbal cues to awaken. Session focus on self-care retraining, activity tolerance, functional transfers, command follow, L inattention in prep for improved ADL/func mobility performance + decreased caregiver burden. Noted incontinence of loose stool in bed, came to sitting EOB with min A to lift trunk and with increased time to rouse 2/2 drowsiness. STS and amb to toilet CGA , resistive to use of SPC this date. Max A for toileting tasks this date 2/2 large incontinence, instructed pt in BLE cleansing, but pt frequently shaking her head no and noted to be less attentive to tasks than in previous sessions. Amb transfer to TTB with CGA and use of grab bar. Doffed shirt with mod A to pull over head and remove BUE. Utilized shower head to cleanse periarea and BLE, pt unable to be guided to assist this date. STS with small posterior LOB req min to mod A to prevent. STS and amb to w/c with CGA. Donned new shirt with mod A to thread LUE and to pull over trunk. Donned pants with min A to thread BLE.    Pt left seated in recliner with telesitter on, safety belt alarm engaged, call bell in reach, and all immediate needs met. Husband present. Noted to fall asleep immediately in recliner after sitting down.   Session 2 [phone] : Pt received seated in recliner with husband, agreeable to therapy. Grimaces and holds stomach throughout session, LPN notified. Session focus on self-care retraining, activity tolerance,  command follow, attention to task, dynamic standing balance in prep for improved ADL/func mobility performance + decreased caregiver burden. STS and amb to toilet with CGA and SPC. CGA for balance during LB clothing management and min A for thoroughness of pericare. Continent void of urine. Noted small incontinence of bowel in pants. Doffed pants with CGA, donned new pair with min A to thread BLE. Washed R hand with close S and set-up of materials at sink. Close S for oral care with suction. Total A w/c transport to and from gym 2/2 time management and close S. Standing at [place], completed 2 min of single target game, req consistent visual cues to locate and tap each target. Does hit the screen multiple times and has poor awareness if she hit the target or not. CGA for balance and no AD throughout.  Amb transfer back in room back to recliner CGA and with SPC.   Pt left seated in recliner with husband present with safety belt alarm engaged, telesitter on, call bell in reach, and all immediate needs met.   Therapy Documentation Precautions:  Precautions Precautions: Fall Precaution Comments: New PEG placement 7/22, global aphasia, L inattention, h/o L wrist disarticulation Restrictions Weight Bearing Restrictions: No  Pain: see session notes   ADL: See Care Tool for more details.   Therapy/Group: Individual Therapy  [name] .[email or web address]  08/18/2020, 6:43 AM

## 2020-08-18 NOTE — Progress Notes (Signed)
Speech Language Pathology Daily Session Note  Patient Details  Name: Jennifer Lucero MRN: [id number] Date of Birth: 1950-08-23  Today's Date: 08/18/2020 SLP Individual Time: 74-1430 SLP Individual Time Calculation (min): 45 min  Short Term Goals: Week 2: SLP Short Term Goal 1 (Week 2): ST=LTGs due to ELOS  Skilled Therapeutic Interventions: Patient agreeable to skilled ST intervention with focus on communication goals and family education with spouse. Spouse inquired about patient's ability to count money, write checks, and continue to manage their check book. SLP educated extensively on patient's aphasia consisting of deficits in verbal expression, comprehension, reading, and writing which would present a challenge completing those tasks at this time. SLP facilitated tracing single letters with max A and hand-over-hand.Progressed to copying large print letters one-at-a-time. She was able to successfully copy "V"; unable with all other letters in patient's name. Facilitated number identification in field of 3 with mod-to-max A verbal/visual cues. She achieved 80% accuracy with single digits, 80% accuracy with 2 digits, and 40% accuracy with 3 digits. Facilitated oral care with mod A verbal and tactile cues for thoroughness. Educated spouse on set-up and use of home suction kit in which he verbalized understanding through teach back and demonstration. Patient was passed off to PT at end of session. Continue per current plan of care.      Pain Pain Assessment Pain Scale: 0-10 Pain Score: 0-No pain  Therapy/Group: Individual Therapy  Patty Sermons 08/18/2020, 2:42 PM

## 2020-08-18 NOTE — Progress Notes (Signed)
PROGRESS NOTE   Subjective/Complaints:  Occ gets up and difficult to redirect , has receptive issues, as well as exp Husband in for training feels comfortable with TF ROS: limited due to language/communication    No results found. Recent Labs    08/17/20 0516  WBC 10.7*  HGB 10.2*  HCT 33.1*  PLT 423*     Recent Labs    08/17/20 0516  NA 139  K 4.5  CL 104  CO2 23  GLUCOSE 139*  BUN 47*  CREATININE 1.34*  CALCIUM 9.0     No intake or output data in the 24 hours ending 08/18/20 1131       Physical Exam: Vital Signs Blood pressure (!) 109/53, pulse 78, temperature 98.1 F (36.7 C), resp. rate 18, height '5\' 4"'$  (1.626 m), weight 95 kg, SpO2 97 %.   General: No acute distress Mood and affect are appropriate Heart: Regular rate and rhythm no rubs murmurs or extra sounds Lungs: Clear to auscultation, breathing unlabored, no rales or wheezes Abdomen: Positive bowel sounds, soft nontender to palpation, nondistended, min tenderness around tube site Extremities: No clubbing, cyanosis, or edema Skin: No evidence of breakdown, no evidence of rash  Neurologic:  motor strength is 5/5 in right and 4/5  left deltoid, bicep, tricep, grip, hip flexor, knee extensors, ankle dorsiflexor and plantar flexor. Senses pain and light touch. No resting tone.   Musculoskeletal: Full range of motion in all 4 extremities, no pain with upper extremity or lower extremity range of motion.. No joint swelling Status post left trans-radial amputation, chronic   Assessment/Plan: 1. Functional deficits which require 3+ hours per day of interdisciplinary therapy in a comprehensive inpatient rehab setting. Physiatrist is providing close team supervision and 24 hour management of active medical problems listed below. Physiatrist and rehab team continue to assess barriers to discharge/monitor patient progress toward functional and medical  goals  Care Tool:  Bathing    Body parts bathed by patient: Left arm, Chest, Abdomen, Right upper leg, Left upper leg, Right lower leg, Left lower leg, Face   Body parts bathed by helper: Right arm     Bathing assist Assist Level: Minimal Assistance - Patient > 75%     Upper Body Dressing/Undressing Upper body dressing   What is the patient wearing?: Pull over shirt    Upper body assist Assist Level: Minimal Assistance - Patient > 75%    Lower Body Dressing/Undressing Lower body dressing      What is the patient wearing?: Pants     Lower body assist Assist for lower body dressing: Minimal Assistance - Patient > 75%     Toileting Toileting    Toileting assist Assist for toileting: Contact Guard/Touching assist     Transfers Chair/bed transfer  Transfers assist     Chair/bed transfer assist level: Contact Guard/Touching assist     Locomotion Ambulation   Ambulation assist      Assist level: Minimal Assistance - Patient > 75% Assistive device: No Device Max distance: 5'   Walk 10 feet activity   Assist     Assist level: Moderate Assistance - Patient - 50 - 74% (+2 wc follow)  Assistive device: Hand held assist   Walk 50 feet activity   Assist    Assist level: Moderate Assistance - Patient - 50 - 74% Assistive device: Hand held assist    Walk 150 feet activity   Assist Walk 150 feet activity did not occur: Safety/medical concerns         Walk 10 feet on uneven surface  activity   Assist Walk 10 feet on uneven surfaces activity did not occur: Safety/medical concerns         Wheelchair     Assist Will patient use wheelchair at discharge?: No      Wheelchair assist level: Moderate Assistance - Patient 50 - 74% Max wheelchair distance: 41f    Wheelchair 50 feet with 2 turns activity    Assist    Wheelchair 50 feet with 2 turns activity did not occur: Safety/medical concerns   Assist Level: Maximal Assistance  - Patient 25 - 49% (maxA for two turns)   Wheelchair 150 feet activity     Assist  Wheelchair 150 feet activity did not occur: Safety/medical concerns       Blood pressure (!) 109/53, pulse 78, temperature 98.1 F (36.7 C), resp. rate 18, height '5\' 4"'$  (1.626 m), weight 95 kg, SpO2 97 %.   Medical Problem List and Plan: 1.  Side weakness with aphasia/dysphagia secondary to right MCA infarction secondary to right M2 occlusion status post mechanical thrombectomy             -patient may shower              -ELOS/Goals: Plan D/C 7/29    -Continue CIR therapies including PT, OT, and SLP  2.  Antithrombotics: -DVT/anticoagulation: Subcutaneous heparin             -antiplatelet therapy: Aspirin 325 mg daily.  Consider to resume Eliquis 10 to 14 days given large infarct with hemorrhagic transformation as noted per neurology services. 3. Pain Management: Tylenol as needed 4. Mood: Provide emotional support             -antipsychotic agents: Seroquel 25 mg nightly, increase to 50 mg  Sleeping at night currently 5. Neuropsych: This patient is not capable of making decisions on her own behalf. 6. Skin/WounModerate d Care: Routine skin checks  7/17- MASD-  con't Gerhardt's and cream- received 3 doses of  IV Diflucan with improvement 7. Fluids/Electrolytes/Nutrition: Routine in and outs with follow-up chemistries ELevated BUN , increase H20 flushes 8.  Seizure prophylaxis.  EEG negative.  Keppra 500 mg twice daily.  Will administer via G-tube 9.  Dysphagia.  NPO.  Alternative means of nutritional support. S/P PEG -continue current bolus feeds 10.  Atrial fibrillation.  Lopressor 25 mg twice daily.  Await plans to resume Eliquis 11.  Hypertension.  Isordil 60 mg twice daily, lisinopril 20 mg daily.  Monitor with increased mobility Vitals:   08/17/20 2014 08/18/20 0428  BP: (!) 127/53 (!) 109/53  Pulse: 85 78  Resp: 16 18  Temp: 98.1 F (36.7 C) 98.1 F (36.7 C)  SpO2: 98% 97%  Blood  pressure still labile, due to elevated BUN/creatinine will discontinue lisinopril.  Will need to increase Lopressor  12.  Diabetes mellitus.  Hemoglobin A1c 9.6.  NovoLog 2 units every 4 hours, Lantus insulin 25 units daily.  Check blood sugars before meals and at bedtime CBG (last 3)  Recent Labs    08/18/20 0017 08/18/20 0425 08/18/20 1116  GLUCAP 273* 144* 273*  Improved cont novolog  3U, changed to TID ac now that bolus feeds are starting  Increased lantus to 25U   Elevated but should improve with bolus vs continous feeds 13.  Hyperlipidemia.  Pravachol 14. Expressive aphasia- needs communication board. 15. Urinary incontinence- having since stroke-likely urgency plus communication issues 16.  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 RUE skin tear- skin care being done- locally 18.  Loose incont stools - persistent - likelyTF/bolus related +/-meds ?pravastatin   -will ask RD for further recs -add fiber and questran to bulk up stool -lomotil prn  LOS: 13 days A FACE TO FACE EVALUATION WAS PERFORMED  Charlett Blake 08/18/2020, 11:31 AM

## 2020-08-19 LAB — GLUCOSE, CAPILLARY
Glucose-Capillary: 118 mg/dL — ABNORMAL HIGH (ref 70–99)
Glucose-Capillary: 219 mg/dL — ABNORMAL HIGH (ref 70–99)
Glucose-Capillary: 241 mg/dL — ABNORMAL HIGH (ref 70–99)
Glucose-Capillary: 249 mg/dL — ABNORMAL HIGH (ref 70–99)
Glucose-Capillary: 280 mg/dL — ABNORMAL HIGH (ref 70–99)
Glucose-Capillary: 290 mg/dL — ABNORMAL HIGH (ref 70–99)
Glucose-Capillary: 351 mg/dL — ABNORMAL HIGH (ref 70–99)

## 2020-08-19 MED ORDER — QUETIAPINE FUMARATE 50 MG PO TABS: 50.0000 mg | ORAL_TABLET | Freq: Every day | ORAL | 0 refills | Status: DC

## 2020-08-19 MED ORDER — ISOSORBIDE DINITRATE 30 MG PO TABS: 60.0000 mg | ORAL_TABLET | Freq: Two times a day (BID) | ORAL | 0 refills | Status: DC

## 2020-08-19 MED ORDER — LANTUS SOLOSTAR 100 UNIT/ML ~~LOC~~ SOPN: 25.0000 [IU] | PEN_INJECTOR | Freq: Every day | SUBCUTANEOUS | 11 refills | Status: DC

## 2020-08-19 MED ORDER — INSULIN GLARGINE 100 UNITS/ML SOLOSTAR PEN: 25.0000 [IU] | PEN_INJECTOR | Freq: Every day | SUBCUTANEOUS | 11 refills | Status: DC

## 2020-08-19 MED ORDER — OSMOLITE 1.5 CAL PO LIQD: 237.0000 mL | Freq: Three times a day (TID) | ORAL | 0 refills | Status: DC

## 2020-08-19 MED ORDER — VITAMIN D (ERGOCALCIFEROL) 1.25 MG (50000 UNIT) PO CAPS: 50000.0000 [IU] | ORAL_CAPSULE | ORAL | 0 refills | Status: DC

## 2020-08-19 MED ORDER — APIXABAN 5 MG PO TABS: 5.0000 mg | ORAL_TABLET | Freq: Two times a day (BID) | ORAL | 1 refills | Status: DC

## 2020-08-19 MED ORDER — ALBUTEROL SULFATE HFA 108 (90 BASE) MCG/ACT IN AERS: 2.0000 | INHALATION_SPRAY | RESPIRATORY_TRACT | 0 refills | Status: DC | PRN

## 2020-08-19 MED ORDER — METOPROLOL TARTRATE 25 MG/10 ML ORAL SUSPENSION: 25.0000 mg | Freq: Two times a day (BID) | ORAL | 0 refills | Status: DC

## 2020-08-19 MED ORDER — FREE WATER
200.0000 mL | Freq: Four times a day (QID) | Status: DC
Start: 2020-08-19 — End: 2022-08-07

## 2020-08-19 MED ORDER — ACETAMINOPHEN 325 MG PO TABS: 650.0000 mg | ORAL_TABLET | ORAL | Status: DC | PRN

## 2020-08-19 MED ORDER — LISINOPRIL 20 MG PO TABS: 20.0000 mg | ORAL_TABLET | Freq: Every day | ORAL | 0 refills | Status: DC

## 2020-08-19 MED ORDER — PROSOURCE TF PO LIQD: ORAL | 1 refills | Status: DC

## 2020-08-19 MED ORDER — LORATADINE 10 MG PO TABS: 10.0000 mg | ORAL_TABLET | Freq: Every day | ORAL | Status: DC | PRN

## 2020-08-19 MED ORDER — INSULIN GLARGINE 100 UNIT/ML SOLOSTAR PEN: 25.0000 [IU] | PEN_INJECTOR | Freq: Every day | SUBCUTANEOUS | 11 refills | Status: DC

## 2020-08-19 MED ORDER — LEVETIRACETAM 100 MG/ML PO SOLN: 500.0000 mg | Freq: Two times a day (BID) | ORAL | 12 refills | Status: AC

## 2020-08-19 MED ORDER — PRAVASTATIN SODIUM 10 MG PO TABS: 10.0000 mg | ORAL_TABLET | Freq: Every day | ORAL | 0 refills | Status: DC

## 2020-08-19 NOTE — Progress Notes (Deleted)
Patient ID: Jennifer Lucero, female   DOB: [birth year], 70 y.o.   MRN: [id number]  Patient declined by [facility]

## 2020-08-19 NOTE — Progress Notes (Signed)
Speech Language Pathology Discharge Summary  Patient Details  Name: Jennifer Lucero MRN: [id number] Date of Birth: [birth year]  Today's Date: 08/19/2020 SLP Individual Time: 1030-1120 SLP Individual Time Calculation (min): 50 min  Skilled Therapeutic Interventions: Patient agreeable to skilled ST intervention with focus on communication and swallow goals. SLP facilitated oral care with suction sponge with overall mod A for thoroughness, as patient was known to move sponge on lips and very minimally on tip of tongue. She was responsive to cues to move sponge around entire oral cavity and surface of tongue with mod A verbal cues. Patient was unable to execute volitional oral motor movements on this date and exhibited oral defensiveness during oral care (clenching down on oral sponge). Facilitated comprehension of 4 image communication board. Patient achieved 30% accuracy and displayed possible guessing, as she would at times point to all the items. Assessed reading comprehension of field of 5 letters with 0% accuracy. Correctly pointed to yes/no communication board with 100% accuracy. Pointed to visual vertical pain scale with 50% accuracy. Facilitated gestures on right hand with counting. Patient was maxA-to-dependent and appeared limited by possible motor apraxia. Provided additional education on communication strategies, oral care recommendations (TID), and using suction with oral care. SLP provided patient and spouse with handouts containing communication strategies, further information on aphasia and apraxia, and simple communication boards. Patient was left in chair with alarm activated and immediate needs within reach at end of session. Continue per current plan of care.    Patient has met 2 of 4 long term goals.  Patient to discharge at overall Mod level.   Reasons goals not met: severity of deficits with minimal progress with dysphagia goals in dysphagia goals  Clinical Impression/Discharge  Summary: Patient has made slow gains yet functional gains and has met 2 of 4 long-term goals this admission. Dysphagia goals not met 2/2 severity of deficits with minimal progress. Patient is currently an overall mod A for functional communication, in which her most successful form of communication is through non-verbal means including gestures, head nodding, pointing, and use of communication aids with a reduced visual field. 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 She has improved in her ability to identify objects, numbers, and emerging in her ability to write trace and copy single letters. From a receptive standpoint, she is able to answer basic yes/no questions via head/nod and follow simple 1-2 step commands, and continues to experience difficulty with answering complex yes/no questions, and following multi-step commands. She benefits from repetition and visual cues or demonstration to enhance comprehension to task. She is currently NPO with long-term form of nutrition via PEG. Assist for oral care fluctuates, although she appears most consistently min-to-mod A for thoroughness. She is able to tolerate a moist oral sponge dipped in water for pleasure intermittently. From a cognitive standpoint she has been known to be impulsive at times requiring consistent supervision for safety. She will discharge home with 24 hour supervision with her husband at an overall mod assist level for communication. Patient's spouse has been extensively educated on expressive/receptive aphasia, communication strategies, oropharyngeal dysphagia, oral care, set-up and use of suction during oral care, moist clean sponge in water only for pleasure, reading/writing strategies/practice for home, and recommendations for continued SLP services. Patient is recommended for f/u [facility] SLP services to maximize communication  and swallow function and to maximize functional independence.  Care Partner:  Caregiver  Able to Provide Assistance: Yes  Type of Caregiver Assistance: Physical;Cognitive  Recommendation:  Home Health SLP;24 hour supervision/assistance  Rationale for SLP Follow Up: Maximize functional communication;Maximize cognitive function and independence;Maximize swallowing safety;Reduce caregiver burden Equipment: Suction   Reasons for discharge: Treatment goals met;Discharged from hospital   Patient/Family Agrees with Progress Made and Goals Achieved: Yes    Tyrik Stetzer T Adnan Vanvoorhis 08/19/2020, 4:50 PM

## 2020-08-19 NOTE — Plan of Care (Signed)
  Problem: RH Swallowing Goal: LTG Patient will participate in dysphagia therapy to increase swallow function with assistance (SLP) Description: LTG:  Patient will participate in dysphagia therapy to increase swallow function with assistance (SLP) Outcome: Adequate for Discharge Goal: LTG Pt will demonstrate functional change in swallow as evidenced by bedside/clinical objective assessment (SLP) Description: LTG: Patient will demonstrate functional change in swallow as evidenced by bedside/clinical objective assessment (SLP) Outcome: Not Met (add Reason)   Problem: RH Comprehension Communication Goal: LTG Patient will comprehend basic/complex auditory (SLP) Description: LTG: Patient will comprehend basic/complex auditory information with cues (SLP). Outcome: Completed/Met   Problem: RH Expression Communication Goal: LTG Patient will express needs/wants via multi-modal(SLP) Description: LTG:  Patient will express needs/wants via multi-modal communication (gestures/written, etc) with cues (SLP) Outcome: Completed/Met

## 2020-08-19 NOTE — Progress Notes (Signed)
Patient ID: Jennifer Lucero, female   DOB: 11/02/1950, 70 y.o.   MRN: [id number]  Patient declined by [facility]

## 2020-08-19 NOTE — Progress Notes (Signed)
Patient ID: Jennifer Lucero, female   DOB: [birth year], 70 y.o.   MRN: [id number]  Patient [name] referral sent to [facility]

## 2020-08-19 NOTE — Progress Notes (Signed)
PROGRESS NOTE   Subjective/Complaints:  Pt shakes head no but not accurate on a consistent basis  ROS: limited due to language/communication    No results found. Recent Labs    08/17/20 0516  WBC 10.7*  HGB 10.2*  HCT 33.1*  PLT 423*     Recent Labs    08/17/20 0516  NA 139  K 4.5  CL 104  CO2 23  GLUCOSE 139*  BUN 47*  CREATININE 1.34*  CALCIUM 9.0     No intake or output data in the 24 hours ending 08/19/20 0749       Physical Exam: Vital Signs Blood pressure (!) 99/47, pulse 80, temperature 98.4 F (36.9 C), resp. rate 16, height '5\' 4"'$  (1.626 m), weight 95 kg, SpO2 95 %.   General: No acute distress Mood and affect are appropriate Heart: Regular rate and rhythm no rubs murmurs or extra sounds Lungs: Clear to auscultation, breathing unlabored, no rales or wheezes Abdomen: Positive bowel sounds, soft nontender to palpation, nondistended Extremities: No clubbing, cyanosis, or edema Skin: No evidence of breakdown, no evidence of rash Neurologic:  motor strength is 5/5 in right and 4/5  left deltoid, bicep, tricep, grip, hip flexor, knee extensors, ankle dorsiflexor and plantar flexor. Senses pain and light touch. No resting tone.   Musculoskeletal: Full range of motion in all 4 extremities, no pain with upper extremity or lower extremity range of motion.. No joint swelling Status post left trans-radial amputation, chronic   Assessment/Plan: 1. Functional deficits which require 3+ hours per day of interdisciplinary therapy in a comprehensive inpatient rehab setting. Physiatrist is providing close team supervision and 24 hour management of active medical problems listed below. Physiatrist and rehab team continue to assess barriers to discharge/monitor patient progress toward functional and medical goals  Care Tool:  Bathing    Body parts bathed by patient: Left arm, Chest, Abdomen, Right upper  leg, Left upper leg, Right lower leg, Left lower leg, Face   Body parts bathed by helper: Right arm     Bathing assist Assist Level: Minimal Assistance - Patient > 75%     Upper Body Dressing/Undressing Upper body dressing   What is the patient wearing?: Pull over shirt    Upper body assist Assist Level: Minimal Assistance - Patient > 75%    Lower Body Dressing/Undressing Lower body dressing      What is the patient wearing?: Pants     Lower body assist Assist for lower body dressing: Minimal Assistance - Patient > 75%     Toileting Toileting    Toileting assist Assist for toileting: Contact Guard/Touching assist     Transfers Chair/bed transfer  Transfers assist     Chair/bed transfer assist level: Contact Guard/Touching assist     Locomotion Ambulation   Ambulation assist      Assist level: Minimal Assistance - Patient > 75% Assistive device: No Device Max distance: 5'   Walk 10 feet activity   Assist     Assist level: Moderate Assistance - Patient - 50 - 74% (+2 wc follow) Assistive device: Hand held assist   Walk 50 feet activity   Assist  Assist level: Moderate Assistance - Patient - 50 - 74% Assistive device: Hand held assist    Walk 150 feet activity   Assist Walk 150 feet activity did not occur: Safety/medical concerns         Walk 10 feet on uneven surface  activity   Assist Walk 10 feet on uneven surfaces activity did not occur: Safety/medical concerns         Wheelchair     Assist Will patient use wheelchair at discharge?: No      Wheelchair assist level: Moderate Assistance - Patient 50 - 74% Max wheelchair distance: 19f    Wheelchair 50 feet with 2 turns activity    Assist    Wheelchair 50 feet with 2 turns activity did not occur: Safety/medical concerns   Assist Level: Maximal Assistance - Patient 25 - 49% (maxA for two turns)   Wheelchair 150 feet activity     Assist  Wheelchair 150  feet activity did not occur: Safety/medical concerns       Blood pressure (!) 99/47, pulse 80, temperature 98.4 F (36.9 C), resp. rate 16, height '5\' 4"'$  (1.626 m), weight 95 kg, SpO2 95 %.   Medical Problem List and Plan: 1.  Side weakness with aphasia/dysphagia secondary to right MCA infarction secondary to right M2 occlusion status post mechanical thrombectomy             -patient may shower              -ELOS/Goals: Plan D/C 7/29    -Continue CIR therapies including PT, OT, and SLP  2.  Antithrombotics: -DVT/anticoagulation: Subcutaneous heparin             -antiplatelet therapy: Aspirin 325 mg daily.  Consider to resume Eliquis 10 to 14 days given large infarct with hemorrhagic transformation as noted per neurology services. 3. Pain Management: Tylenol as needed 4. Mood: Provide emotional support             -antipsychotic agents: Seroquel 25 mg nightly, increase to 50 mg  Sleeping at night currently 5. Neuropsych: This patient is not capable of making decisions on her own behalf. 6. Skin/WounModerate d Care: Routine skin checks  7/17- MASD-  con't Gerhardt's and cream- received 3 doses of  IV Diflucan with improvement 7. Fluids/Electrolytes/Nutrition: Routine in and outs with follow-up chemistries ELevated BUN , increase H20 flushes 8.  Seizure prophylaxis.  EEG negative.  Keppra 500 mg twice daily.  Will administer via G-tube 9.  Dysphagia.  NPO.  Alternative means of nutritional support. S/P PEG -continue current bolus feeds 10.  Atrial fibrillation.  Lopressor 25 mg twice daily.  Await plans to resume Eliquis 11.  Hypertension.  Isordil 60 mg twice daily, lisinopril 20 mg daily.  Monitor with increased mobility Vitals:   08/18/20 1955 08/19/20 0406  BP: (!) 106/46 (!) 99/47  Pulse: 74 80  Resp: 18 16  Temp: 98.2 F (36.8 C) 98.4 F (36.9 C)  SpO2: 98% 95%  Controlled , HR ok , sys should increase since fluid boluses increased   12.  Diabetes mellitus.  Hemoglobin  A1c 9.6.  NovoLog 2 units every 4 hours, Lantus insulin 25 units daily.  Check blood sugars before meals and at bedtime CBG (last 3)  Recent Labs    08/18/20 2022 08/19/20 0017 08/19/20 0355  GLUCAP 364* 241* 118*   Improved cont novolog  3U, changed to TID ac now that bolus feeds are starting  Increased lantus to 25U  Elevated but should improve with bolus vs continous feeds Will need PCP f/u  13.  Hyperlipidemia.  Pravachol 14. Expressive aphasia- needs communication board. 15. Urinary incontinence- having since stroke-likely urgency plus communication issues 16.  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 RUE skin tear- skin care being done- locally 18.  Loose incont stools - persistent - likelyTF/bolus related +/-meds ?pravastatin   -will ask RD for further recs -add fiber and questran to bulk up stool -lomotil prn  LOS: 14 days A FACE TO FACE EVALUATION WAS PERFORMED  Jennifer Lucero 08/19/2020, 7:49 AM

## 2020-08-19 NOTE — Progress Notes (Signed)
Patient ID: Jennifer Lucero, female   DOB: 1950/07/31, 70 y.o.   MRN: [id number]  [employer] will arrive to patients home about 9:30 AM. To set up tub feedings. Patient covered at 100%  Erlene Quan, [place]

## 2020-08-19 NOTE — Progress Notes (Signed)
Occupational Therapy Discharge Summary  Patient Details  Name: Jennifer Lucero MRN: [id number] Date of Birth: [birth year]  Today's Date: 08/19/2020 OT Individual Time: 0807-0900 OT Individual Time Calculation (min): 53 min    Patient has met 10 of 12 long term goals due to improved activity tolerance, improved balance, postural control, ability to compensate for deficits, functional use of  LEFT upper extremity, improved attention, improved awareness, and improved coordination.  Patient to discharge at [facility] Assist level.  Patient's care partner is independent to provide the necessary physical and cognitive assistance at discharge.    Reasons goals not met: Pt progress slowed during CIR stay 2/2 c. Diff infection causing frequent incontinent loose stools, periarea skin breakdown, and abdominal pain sp G tube placement resulting in impaired participation in therapy. Goals ultimately downgraded to min A overall during stay.  Recommendation:  Patient will benefit from ongoing skilled OT services in home health setting to continue to advance functional skills in the area of BADL and Reduce care partner burden.  Equipment: 3in1 , TTB  Reasons for discharge: treatment goals met and discharge from hospital  Patient/family agrees with progress made and goals achieved: Yes  OT Discharge Precautions/Restrictions  Precautions Precautions: Fall Precaution Comments: New PEG placement 7/22, global aphasia, L inattention, h/o L wrist disarticulation Restrictions Weight Bearing Restrictions: No  Pain  No s or s/x ADL ADL Eating: NPO Where Assessed-Eating: Wheelchair Grooming: Supervision/safety Where Assessed-Grooming: Sitting at sink Upper Body Bathing: Minimal assistance Where Assessed-Upper Body Bathing: Sitting at sink Lower Body Bathing: Minimal assistance Where Assessed-Lower Body Bathing: Sitting at sink, Standing at sink Upper Body Dressing: Minimal assistance Where  Assessed-Upper Body Dressing: Sitting at sink Lower Body Dressing: Minimal assistance Where Assessed-Lower Body Dressing: Standing at sink Toileting: Minimal assistance Where Assessed-Toileting: Glass blower/designer: Therapist, music Method: Counselling psychologist: Grab bars, Raised toilet seat Tub/Shower Transfer: Minimal Museum/gallery conservator Method: Product/process development scientist Method: Heritage manager: Radio broadcast assistant Vision Patient Visual Report: Other (comment) (Difficult to fully assess 2/2 cognitive deficits.) Vision Assessment?: Vision impaired- to be further tested in functional context Additional Comments: Difficult to fully assess 2/2 cognitive deficits. Perception  Inattention/Neglect: Does not attend to left side of body Praxis Praxis: Impaired Praxis Impairment Details: Motor planning;Initiation;Ideomotor;Ideation Cognition Overall Cognitive Status: Difficult to assess (difficult to assess secondary to aphasia) Arousal/Alertness: Awake/alert Orientation Level: Oriented to person Attention: Sustained Sustained Attention: Impaired Memory: Impaired (difficult to assess secondary to aphasia) Awareness: Impaired Problem Solving: Impaired Behaviors: Impulsive Safety/Judgment: Impaired Sensation Sensation Light Touch:  (difficult to assess secondary to aphasia) Hot/Cold: Not tested Proprioception: Impaired by gross assessment Stereognosis: Appears Intact Additional Comments: Requires increased tactile cues to draw attention to LUE/LLE Coordination Gross Motor Movements are Fluid and Coordinated: No Fine Motor Movements are Fluid and Coordinated: No Coordination and Movement Description: Slowed gross motor movements further impaired by motor planning difficulties and L inattention, moderately improved from eval. Motor  Motor Motor: Other (comment) Motor -  Discharge Observations: Slowed gross motor movements further impaired by motor planning difficulties and L inattention, moderately improved from eval. Mobility  Bed Mobility Bed Mobility: Supine to Sit;Sit to Supine Supine to Sit: Supervision/Verbal cueing Sit to Supine: Supervision/Verbal cueing Transfers Sit to Stand: Contact Guard/Touching assist Stand to Sit: Contact Guard/Touching assist  Trunk/Postural Assessment  Cervical Assessment Cervical Assessment: Exceptions to [facility] (forward head) Thoracic Assessment Thoracic Assessment: Exceptions to [facility] (rounded shoulders) Lumbar Assessment Lumbar  Assessment: Exceptions to [facility] (posterior pelvic tilt) Postural Control Postural Control: Deficits on evaluation  Balance Balance Balance Assessed: Yes Static Sitting Balance Static Sitting - Balance Support: Feet supported Static Sitting - Level of Assistance: 5: Stand by assistance Static Sitting - Comment/# of Minutes: S Dynamic Sitting Balance Dynamic Sitting - Balance Support: Feet supported Dynamic Sitting - Level of Assistance: 5: Stand by assistance Dynamic Sitting Balance - Compensations: S Static Standing Balance Static Standing - Balance Support: Right upper extremity supported Static Standing - Level of Assistance: 5: Stand by assistance Static Standing - Comment/# of Minutes: S Dynamic Standing Balance Dynamic Standing - Balance Support: During functional activity;Right upper extremity supported Dynamic Standing - Level of Assistance: 5: Stand by assistance Dynamic Standing - Comments: CGA Extremity/Trunk Assessment RUE Assessment RUE Assessment: Within Functional Limits LUE Assessment LUE Assessment: Exceptions to [facility] General Strength Comments: mild hemiparesis 4/5 in shoulder flexion, hx of below elbow amputation LUE Body System: Neuro Brunstrum levels for arm and hand: Arm Brunstrum level for arm: Stage V Relative Independence from [employer]  Session Note: Pt  received semi-reclined in bed with NT present cleaning up large episode of loose bowel incontinence, agreeable to therapy. Session focus on self-care retraining, activity tolerance, command follow in prep for improved ADL/func mobility performance + decreased caregiver burden. Came to sitting EOB with min A to lift trunk 2/2 laying flat surface. Amb to toilet with CGA and no AD, no further void. Amb transfer to TTB in shower with CGA. Small posterior LOB req min A to control descent. Pt able to assist with lathering shampoo but difficult to direct to dry extremities/look up 2/2 decreased alertness/participation this date. LUE IV and G tube site covered and care taken to not wet. G tube site gauze slightly damp at end of shower, RN notified. Amb back out to w/c with CGA and furniture walking. Donned shirt x2 as first shirt too small, req min A with larger shirt to thread LUE and heavy visual and verbal cues to pull over trunk. Donned pants min A to thread BLE, CGA for STS at sink. Brushed hair and completed oral care with suction with min A this date for thoroughness. Amb transfer to recliner CGA and no AD.   Pt left seated in recliner with safety belt alarm engaged, telesitter on, call bell in reach, and all immediate needs met.     [name] MS, OTR/L  08/19/2020, 7:14 AM

## 2020-08-19 NOTE — Progress Notes (Signed)
Patient ID: Jennifer Lucero, female   DOB: 1950-02-25, 70 y.o.   MRN: [id number]  Patient accepted by [facility] Monday

## 2020-08-19 NOTE — Progress Notes (Signed)
Patient ID: Jennifer Lucero, female   DOB: Mar 08, 1950, 70 y.o.   MRN: [id number]  [facility] Bed ordered through [place], [place]

## 2020-08-19 NOTE — Plan of Care (Signed)
  Problem: RH Toileting Goal: LTG Patient will perform toileting task (3/3 steps) with assistance level (OT) Description: LTG: Patient will perform toileting task (3/3 steps) with assistance level (OT)  Outcome: Not Met (add Reason) Flowsheets (Taken 08/19/2020 1128) LTG: Pt will perform toileting task (3/3 steps) with assistance level: (Pt req min A for hygeine thoroughness) -- Note: Pt req min A for hygeine thoroughness   Problem: RH Tub/Shower Transfers Goal: LTG Patient will perform tub/shower transfers w/assist (OT) Description: LTG: Patient will perform tub/shower transfers with assist, with/without cues using equipment (OT) Outcome: Not Met (add Reason) Flowsheets (Taken 08/19/2020 1128) LTG: Pt will perform tub/shower stall transfers with assistance level of: (Pt cont to req min A for BLE management) -- Note: Pt cont to req min A for BLE management   Problem: RH Balance Goal: LTG: Patient will maintain dynamic sitting balance (OT) Description: LTG:  Patient will maintain dynamic sitting balance with assistance during activities of daily living (OT) Outcome: Completed/Met Goal: LTG Patient will maintain dynamic standing with ADLs (OT) Description: LTG:  Patient will maintain dynamic standing balance with assist during activities of daily living (OT)  Outcome: Completed/Met   Problem: Sit to Stand Goal: LTG:  Patient will perform sit to stand in prep for activites of daily living with assistance level (OT) Description: LTG:  Patient will perform sit to stand in prep for activites of daily living with assistance level (OT) Outcome: Completed/Met   Problem: RH Grooming Goal: LTG Patient will perform grooming w/assist,cues/equip (OT) Description: LTG: Patient will perform grooming with assist, with/without cues using equipment (OT) Outcome: Completed/Met   Problem: RH Bathing Goal: LTG Patient will bathe all body parts with assist levels (OT) Description: LTG: Patient will bathe  all body parts with assist levels (OT) Outcome: Completed/Met   Problem: RH Dressing Goal: LTG Patient will perform upper body dressing (OT) Description: LTG Patient will perform upper body dressing with assist, with/without cues (OT). Outcome: Completed/Met Goal: LTG Patient will perform lower body dressing w/assist (OT) Description: LTG: Patient will perform lower body dressing with assist, with/without cues in positioning using equipment (OT) Outcome: Completed/Met   Problem: RH Toilet Transfers Goal: LTG Patient will perform toilet transfers w/assist (OT) Description: LTG: Patient will perform toilet transfers with assist, with/without cues using equipment (OT) Outcome: Completed/Met   Problem: RH Functional Use of Upper Extremity Goal: LTG Patient will use RT/LT upper extremity as a (OT) Description: LTG: Patient will use right/left upper extremity as a stabilizer/gross assist/diminished/nondominant/dominant level with assist, with/without cues during functional activity (OT) Outcome: Completed/Met

## 2020-08-19 NOTE — Progress Notes (Signed)
Patient ID: Jennifer Lucero, female   DOB: 20-Feb-1950, 70 y.o.   MRN: [id number]  SW followed up with [facility] on patient referral. Patient referral submitted to intake and they will reach out to pt spouse and schedule additional education on TF. Orders received. Patient referral will be expedited for d/c tomorrow.   [place], [place]

## 2020-08-20 LAB — GLUCOSE, CAPILLARY
Glucose-Capillary: 228 mg/dL — ABNORMAL HIGH (ref 70–99)
Glucose-Capillary: 139 mg/dL — ABNORMAL HIGH (ref 70–99)

## 2020-08-20 NOTE — Progress Notes (Signed)
Inpatient Rehabilitation Care Coordinator Discharge Note  The overall goal for the admission was met for:   Discharge location: Yes, Home  Length of Stay: Yes, 15 Days  Discharge activity level: Yes  Home/community participation: Yes  Services provided included: MD, RD, PT, OT, SLP, RN, CM, TR, Pharmacy, Neuropsych, and SW  Financial Services: Private Insurance: [facility] Medicare  Choices offered to/list presented to: Spouse  Follow-up services arranged: Home Health: [place] and DME: [facility]  for feeding tube needs PCS Referral faxed  Comments (or additional information): RN PT OT [facility] Bed, Wheelchair, Producer, television/film/video, Bedside Commode, Suction Package  Patient/Family verbalized understanding of follow-up arrangements: Yes  Individual responsible for coordination of the follow-up planBarnett Abu, [phone]  Confirmed correct DME delivered: [name] 08/20/2020    [name]

## 2020-08-20 NOTE — Progress Notes (Signed)
PROGRESS NOTE   Subjective/Complaints:  Pt shakes head no but not accurate on a consistent basis, husband at bedside, feels comfortable administering meds and TF via G tube   ROS: limited due to language/communication    No results found. No results for input(s): WBC, HGB, HCT, PLT in the last 72 hours.   No results for input(s): NA, K, CL, CO2, GLUCOSE, BUN, CREATININE, CALCIUM in the last 72 hours.    Intake/Output Summary (Last 24 hours) at 08/20/2020 0901 Last data filed at 08/19/2020 1155 Gross per 24 hour  Intake 337 ml  Output --  Net 337 ml         Physical Exam: Vital Signs Blood pressure (!) 119/58, pulse 95, temperature 98 F (36.7 C), resp. rate 16, height '5\' 4"'$  (1.626 m), weight 95.2 kg, SpO2 96 %.  General: No acute distress Mood and affect are appropriate Heart: Regular rate and rhythm no rubs murmurs or extra sounds Lungs: Clear to auscultation, breathing unlabored, no rales or wheezes Abdomen: Positive bowel sounds, soft nontender to palpation, nondistended Extremities: No clubbing, cyanosis, or edema Skin: No evidence of breakdown, no evidence of rash eral upper and lower extremities Musculoskeletal: Full range of motion in all 4 extremities. No joint swelling  Neurologic:  motor strength is 5/5 in right and 4/5  left deltoid, bicep, tricep, grip, hip flexor, knee extensors, ankle dorsiflexor and plantar flexor. Senses pain and light touch. No resting tone.   Musculoskeletal: Full range of motion in all 4 extremities, no pain with upper extremity or lower extremity range of motion.. No joint swelling Status post left trans-radial amputation, chronic   Assessment/Plan: 1. Functional deficits due to L MCA CVA Stable for D/C today F/u PCP in 3-4 weeks F/u PM&R 2 weeks See D/C summary See D/C instructions  Care Tool:  Bathing    Body parts bathed by patient: Left arm, Chest, Abdomen, Right  upper leg, Left upper leg, Right lower leg, Left lower leg, Face   Body parts bathed by helper: Right arm, Buttocks     Bathing assist Assist Level: Minimal Assistance - Patient > 75%     Upper Body Dressing/Undressing Upper body dressing   What is the patient wearing?: Pull over shirt    Upper body assist Assist Level: Minimal Assistance - Patient > 75%    Lower Body Dressing/Undressing Lower body dressing      What is the patient wearing?: Pants     Lower body assist Assist for lower body dressing: Minimal Assistance - Patient > 75%     Toileting Toileting    Toileting assist Assist for toileting: Minimal Assistance - Patient > 75%     Transfers Chair/bed transfer  Transfers assist     Chair/bed transfer assist level: Supervision/Verbal cueing     Locomotion Ambulation   Ambulation assist      Assist level: Contact Guard/Touching assist Assistive device: Cane-straight Max distance: 287f   Walk 10 feet activity   Assist     Assist level: Supervision/Verbal cueing Assistive device: Cane-straight   Walk 50 feet activity   Assist    Assist level: Contact Guard/Touching assist Assistive device: Cane-straight  Walk 150 feet activity   Assist Walk 150 feet activity did not occur: Safety/medical concerns  Assist level: Contact Guard/Touching assist Assistive device: Cane-straight    Walk 10 feet on uneven surface  activity   Assist Walk 10 feet on uneven surfaces activity did not occur: Safety/medical concerns   Assist level: Contact Guard/Touching assist Assistive device: Cane-straight   Wheelchair     Assist Will patient use wheelchair at discharge?: Yes Type of Wheelchair: Manual    Wheelchair assist level: Contact Guard/Touching assist Max wheelchair distance: 75    Wheelchair 50 feet with 2 turns activity    Assist    Wheelchair 50 feet with 2 turns activity did not occur: Safety/medical concerns   Assist  Level: Contact Guard/Touching assist   Wheelchair 150 feet activity     Assist  Wheelchair 150 feet activity did not occur: Safety/medical concerns   Assist Level: Maximal Assistance - Patient 25 - 49%   Blood pressure (!) 119/58, pulse 95, temperature 98 F (36.7 C), resp. rate 16, height '5\' 4"'$  (1.626 m), weight 95.2 kg, SpO2 96 %.   Medical Problem List and Plan: 1.  Side weakness with aphasia/dysphagia secondary to right MCA infarction secondary to right M2 occlusion status post mechanical thrombectomy             -patient may shower              -ELOS/Goals: Plan D/C 7/29    -Continue CIR therapies including PT, OT, and SLP  2.  Antithrombotics: -DVT/anticoagulation: Subcutaneous heparin             -antiplatelet therapy: Aspirin 325 mg daily.  Consider to resume Eliquis 10 to 14 days given large infarct with hemorrhagic transformation as noted per neurology services. 3. Pain Management: Tylenol as needed 4. Mood: Provide emotional support             -antipsychotic agents: Seroquel 25 mg nightly, increase to 50 mg  Sleeping at night currently 5. Neuropsych: This patient is not capable of making decisions on her own behalf. 6. Skin/WounModerate d Care: Routine skin checks  7/17- MASD-  con't Gerhardt's and cream- received 3 doses of  IV Diflucan with improvement 7. Fluids/Electrolytes/Nutrition: Routine in and outs with follow-up chemistries ELevated BUN , increase H20 flushes 8.  Seizure prophylaxis.  EEG negative.  Keppra 500 mg twice daily.  Will administer via G-tube 9.  Dysphagia.  NPO.  Alternative means of nutritional support. S/P PEG -continue current bolus feeds 10.  Atrial fibrillation.  Lopressor 25 mg twice daily.  Await plans to resume Eliquis 11.  Hypertension.  Isordil 60 mg twice daily, lisinopril 20 mg daily.  Monitor with increased mobility Vitals:   08/19/20 1931 08/20/20 0410  BP: (!) 147/64 (!) 119/58  Pulse: 63 95  Resp: 20 16  Temp: 97.9 F (36.6  C) 98 F (36.7 C)  SpO2: 99% 96%  Controlled , HR ok , sys should increase since fluid boluses increased   12.  Diabetes mellitus.  Hemoglobin A1c 9.6.  NovoLog 2 units every 4 hours, Lantus insulin 25 units daily.  Check blood sugars before meals and at bedtime CBG (last 3)  CBGs improving  13.  Hyperlipidemia.  Pravachol 14. Expressive aphasia- needs communication board. 15. Urinary incontinence- having since stroke-likely urgency plus communication issues 16.  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 RUE skin tear- skin care being done- locally 18.  Loose incont stools - persistent - likelyTF/bolus  related +/-meds ?pravastatin   -will ask RD for further recs -add fiber and questran to bulk up stool -lomotil prn  LOS: 15 days A FACE TO FACE EVALUATION WAS PERFORMED  Charlett Blake 08/20/2020, 9:01 AM

## 2020-08-20 NOTE — Progress Notes (Signed)
Physical Therapy Discharge Summary  Patient Details  Name: Jennifer Lucero MRN: [id number] Date of Birth: 10/29/1950  Today's Date: 08/19/2020 PT Individual Time:  4097-3532  PT Individual Time Calculation (min): 68 min     Patient has met 9 of 9 long term goals due to improved activity tolerance, improved balance, improved postural control, increased strength, functional use of  left upper extremity and left lower extremity, improved attention, improved awareness, and improved coordination.  Patient to discharge at an ambulatory level  CGA .   Patient's care partner is independent to provide the necessary physical assistance at discharge.  Reasons goals not met: n/a  Recommendation:  Patient will benefit from ongoing skilled PT services in home health setting to continue to advance safe functional mobility, address ongoing impairments in strength, coordination, balance, activity tolerance, cognition, safety awareness, and to minimize fall risk.  Equipment: Wheelchair, [facility], hospital bed  Reasons for discharge: treatment goals met, discharge from hospital, and ability of caregiver to provide physical and supervisory assist at patient's current level  Patient/family agrees with progress made and goals achieved: Yes   Treatment Session: PT instructed pt in Grad day assessment to measure progress toward goals. See below for details.   Five times Sit to Stand Test (FTSS) Method: Use a straight back chair with a solid seat that is 16-18" high. Ask participant to sit on the chair with arms folded across their chest.   Instructions: "Stand up and sit down as quickly as possible 5 times, keeping your arms folded across your chest."   Measurement: Stop timing when the participant stands the 5th time.  TIME: 33 seconds - pt takes longer time today than on eval d/t recent PEG tube placement and pain in abdomen with movement. Requires continuous cues for consistent effort.  Times > 13.6  seconds is associated with increased disability and morbidity (Guralnik, 2000) Times > 15 seconds is predictive of recurrent falls in healthy individuals aged 66 and older (Buatois, et al., 2008) Normal performance values in community dwelling individuals aged 33 and older (Bohannon, 2006): 60-69 years: 11.4 seconds 70-79 years: 12.6 seconds 80-89 years: 14.8 seconds  MCID: ? 2.3 seconds for Vestibular Disorders Mariah Milling, 2006)     PT Discharge Precautions/Restrictions Precautions Precautions: Fall Precaution Comments: New PEG placement 7/22, global aphasia, L inattention, h/o L wrist disarticulation Restrictions Weight Bearing Restrictions: No Pain Pain Assessment Pain Scale: Faces Faces Pain Scale: Hurts little more Pain Type: Acute pain (Bloating/ gas pain) Pain Location: Abdomen Vision/Perception  Perception Perception: Impaired Inattention/Neglect: Does not attend to left side of body Praxis Praxis: Impaired Praxis Impairment Details: Motor planning;Initiation;Ideomotor;Ideation  Cognition Overall Cognitive Status: Impaired/Different from baseline Arousal/Alertness: Awake/alert Orientation Level: Oriented to person;Oriented to place;Oriented to situation Sustained Attention: Impaired Memory: Impaired Awareness Impairment: Intellectual impairment Problem Solving: Impaired Executive Function: Sequencing;Decision Making;Initiating;Self Correcting Sequencing: Impaired Decision Making: Impaired Initiating: Impaired Self Correcting: Impaired Behaviors: Impulsive Safety/Judgment: Impaired Sensation Sensation Light Touch: Appears Intact Coordination Gross Motor Movements are Fluid and Coordinated: No Fine Motor Movements are Fluid and Coordinated: No Coordination and Movement Description: Slowed gross motor movements further impaired by motor planning difficulties and L inattention, moderately improved from eval. Heel Shin Test: continued difficulty in following  instructions despite repeated visual demonstration and verbal instruction Motor  Motor Motor: Other (comment) Motor - Discharge Observations: Slowed gross motor movements further impaired by motor planning difficulties and L inattention, moderately improved from eval.  Mobility Bed Mobility Rolling Right: Independent with assistive device Rolling Left:  Independent with assistive device Supine to Sit: Supervision/Verbal cueing;Contact Guard/Touching assist Sit to Supine: Independent with assistive device Transfers Transfers: Sit to Stand;Stand to Sit;Stand Pivot Transfers Sit to Stand: Supervision/Verbal cueing Stand to Sit: Supervision/Verbal cueing Stand Pivot Transfers: Contact Guard/Touching assist;Supervision/Verbal cueing Stand Pivot Transfer Details: Tactile cues for placement;Verbal cues for technique;Verbal cues for precautions/safety;Verbal cues for safe use of DME/AE Transfer (Assistive device): None Locomotion  Gait Ambulation: Yes Gait Assistance: Contact Guard/Touching assist Gait Distance (Feet): 200 Feet Assistive device: Straight cane Gait Assistance Details: Verbal cues for precautions/safety;Verbal cues for safe use of DME/AE Gait Gait: Yes Gait Pattern: Impaired Gait Pattern: Lateral trunk lean to left;Lateral trunk lean to right;Wide base of support;Decreased stride length;Step-through pattern Gait velocity: reduced Stairs / Additional Locomotion Stairs: Yes Stairs Assistance: Contact Guard/Touching assist Stair Management Technique: Step to pattern;One rail Right Number of Stairs: 8 Height of Stairs: 6 Ramp: Contact Guard/touching assist Curb: Nurse, mental health Mobility: Yes Wheelchair Assistance: Minimal assistance - Patient >75% Wheelchair Propulsion: Both lower extermities Wheelchair Parts Management: Needs assistance Distance: 75  Trunk/Postural Assessment  Cervical Assessment Cervical Assessment:  Exceptions to [facility] (forward head) Thoracic Assessment Thoracic Assessment: Exceptions to [facility] (rounded shoulders) Lumbar Assessment Lumbar Assessment: Exceptions to [facility] (posterior pelvic tilt/ sacral sits) Postural Control Postural Control: Within Functional Limits  Balance Balance Balance Assessed: Yes Berg Balance Test Sit to Stand: Able to stand  independently using hands Standing Unsupported: Able to stand 2 minutes with supervision Sitting with Back Unsupported but Feet Supported on Floor or Stool: Able to sit safely and securely 2 minutes Stand to Sit: Uses backs of legs against chair to control descent Transfers: Able to transfer with verbal cueing and /or supervision Standing Unsupported with Eyes Closed: Able to stand 10 seconds with supervision Standing Ubsupported with Feet Together: Needs help to attain position and unable to hold for 15 seconds From Standing, Reach Forward with Outstretched Arm: Can reach forward >5 cm safely (2") From Standing Position, Pick up Object from Floor: Able to pick up shoe, needs supervision From Standing Position, Turn to Look Behind Over each Shoulder: Turn sideways only but maintains balance Turn 360 Degrees: Able to turn 360 degrees safely but slowly Standing Unsupported, Alternately Place Feet on Step/Stool: Able to complete >2 steps/needs minimal assist Standing Unsupported, One Foot in Front: Able to take small step independently and hold 30 seconds Standing on One Leg: Tries to lift leg/unable to hold 3 seconds but remains standing independently Total Score: 30 Static Sitting Balance Static Sitting - Balance Support: Feet supported Static Sitting - Level of Assistance: 6: Modified independent (Device/Increase time) Dynamic Sitting Balance Dynamic Sitting - Balance Support: Feet supported Dynamic Sitting - Level of Assistance: 5: Stand by assistance Static Standing Balance Static Standing - Balance Support: No upper extremity  supported Static Standing - Level of Assistance: 5: Stand by assistance Dynamic Standing Balance Dynamic Standing - Balance Support: During functional activity;Right upper extremity supported Dynamic Standing - Level of Assistance: 5: Stand by assistance Dynamic Standing - Comments: supervision/ CGA Extremity Assessment      RLE Assessment RLE Assessment: Within Functional Limits General Strength Comments: 5/5 globally LLE Assessment LLE Assessment: Within Functional Limits LLE Strength LLE Overall Strength Comments: 5/5 globally   Alger Simons PT, DPT 08/19/2020, 4:53 PM

## 2020-08-20 NOTE — Plan of Care (Signed)
Patient incontinent of bowel and bladder despite timed toileting and prompted toileting

## 2020-08-22 NOTE — Plan of Care (Signed)
  Problem: RH Balance Goal: LTG Patient will maintain dynamic sitting balance (PT) Description: LTG:  Patient will maintain dynamic sitting balance with assistance during mobility activities (PT) Outcome: Completed/Met Flowsheets (Taken 08/19/2020 1738) LTG: Pt will maintain dynamic sitting balance during mobility activities with:: Independent with assistive device    Problem: Sit to Stand Goal: LTG:  Patient will perform sit to stand with assistance level (PT) Description: LTG:  Patient will perform sit to stand with assistance level (PT) Outcome: Completed/Met Flowsheets (Taken 08/19/2020 1738) LTG: PT will perform sit to stand in preparation for functional mobility with assistance level: Supervision/Verbal cueing   Problem: RH Bed Mobility Goal: LTG Patient will perform bed mobility with assist (PT) Description: LTG: Patient will perform bed mobility with assistance, with/without cues (PT). Outcome: Completed/Met Flowsheets (Taken 08/19/2020 1738) LTG: Pt will perform bed mobility with assistance level of: Independent with assistive device    Problem: RH Bed to Chair Transfers Goal: LTG Patient will perform bed/chair transfers w/assist (PT) Description: LTG: Patient will perform bed to chair transfers with assistance (PT). Outcome: Completed/Met Flowsheets (Taken 08/19/2020 1738) LTG: Pt will perform Bed to Chair Transfers with assistance level: Supervision/Verbal cueing   Problem: RH Car Transfers Goal: LTG Patient will perform car transfers with assist (PT) Description: LTG: Patient will perform car transfers with assistance (PT). Outcome: Completed/Met Flowsheets (Taken 08/19/2020 1738) LTG: Pt will perform car transfers with assist:: Contact Guard/Touching assist   Problem: RH Ambulation Goal: LTG Patient will ambulate in controlled environment (PT) Description: LTG: Patient will ambulate in a controlled environment, # of feet with assistance (PT). Outcome:  Completed/Met Flowsheets (Taken 08/19/2020 1738) LTG: Pt will ambulate in controlled environ  assist needed:: Contact Guard/Touching assist LTG: Ambulation distance in controlled environment: 271ft Goal: LTG Patient will ambulate in home environment (PT) Description: LTG: Patient will ambulate in home environment, # of feet with assistance (PT). Outcome: Completed/Met Flowsheets (Taken 08/18/2020 1022 by Lorie Phenix, PT) LTG: Pt will ambulate in home environ  assist needed:: Contact Guard/Touching assist LTG: Ambulation distance in home environment: 15ft   Problem: RH Wheelchair Mobility Goal: LTG Patient will propel w/c in controlled environment (PT) Description: LTG: Patient will propel wheelchair in controlled environment, # of feet with assist (PT) Outcome: Completed/Met Flowsheets (Taken 08/19/2020 1738) LTG: Pt will propel w/c in controlled environ  assist needed:: Contact Guard/Touching assist LTG: Propel w/c distance in controlled environment: 75 ft   Problem: RH Stairs Goal: LTG Patient will ambulate up and down stairs w/assist (PT) Description: LTG: Patient will ambulate up and down # of stairs with assistance (PT) Outcome: Completed/Met Flowsheets (Taken 08/19/2020 1738) LTG: Pt will ambulate up/down stairs assist needed:: Contact Guard/Touching assist LTG: Pt will  ambulate up and down number of stairs: 8 steps

## 2020-08-23 NOTE — Plan of Care (Signed)
See previous note

## 2020-09-01 ENCOUNTER — Encounter: Payer: Medicare Other | Admitting: Registered Nurse

## 2020-09-02 ENCOUNTER — Other Ambulatory Visit: Payer: Self-pay

## 2020-09-03 ENCOUNTER — Encounter: Payer: Self-pay | Admitting: Cardiology

## 2020-09-03 ENCOUNTER — Other Ambulatory Visit: Payer: Self-pay

## 2020-09-03 ENCOUNTER — Ambulatory Visit (INDEPENDENT_AMBULATORY_CARE_PROVIDER_SITE_OTHER): Payer: Medicare Other | Admitting: Cardiology

## 2020-09-03 VITALS — BP 124/64 | HR 74 | Ht 63.0 in | Wt 203.0 lb

## 2020-09-03 DIAGNOSIS — E1100 Type 2 diabetes mellitus with hyperosmolarity without nonketotic hyperglycemic-hyperosmolar coma (NKHHC): Secondary | ICD-10-CM

## 2020-09-03 DIAGNOSIS — I10 Essential (primary) hypertension: Secondary | ICD-10-CM | POA: Diagnosis not present

## 2020-09-03 DIAGNOSIS — I63511 Cerebral infarction due to unspecified occlusion or stenosis of right middle cerebral artery: Secondary | ICD-10-CM | POA: Diagnosis not present

## 2020-09-03 DIAGNOSIS — E782 Mixed hyperlipidemia: Secondary | ICD-10-CM

## 2020-09-03 NOTE — Patient Instructions (Signed)
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
## 2020-09-15 ENCOUNTER — Telehealth: Payer: Self-pay | Admitting: *Deleted

## 2020-09-15 MED ORDER — GLUCERNA 1.5 CAL/CARBSTEADY PO LIQD: 237.0000 mL | Freq: Four times a day (QID) | ORAL | 0 refills | Status: DC

## 2020-09-15 NOTE — Telephone Encounter (Signed)
Chart review.

## 2020-09-15 NOTE — Telephone Encounter (Signed)
Per Dr Naaman Plummer, change TF to Glucerna from Osmolite. Old order read Osmolite 1.5 cal Place 237 mLs into feeding tube 4 (four) times daily -before meals and at bedtime. New order to be Glucerna same directions.  Faxed to [facility] in [facility].  Fax # [phone]

## 2020-09-15 NOTE — Telephone Encounter (Signed)
Barbara NCM from [facility] called and reports that Jennifer Lucero husband feels her tube feedings are making her blood sugar run too high.  She is on Diet: Osmolite 237 mL 3 times daily.  Free water 200 mL 3 times daily   They are requesting something more diabetic friendly.

## 2020-09-21 ENCOUNTER — Telehealth: Payer: Self-pay | Admitting: Physician Assistant

## 2020-09-21 NOTE — Telephone Encounter (Signed)
Patient's husband [name] called IR due to concerns regarding bleeding from G-tube (placed 08/13/20 by Dr. Anselm Pancoast).  [name] states patient had diarrhea and while bending over to clean the floor several times she began to have some bleeding from the skin site. He reports that the blood was "not too much" and stopped quickly. He is wondering if it is ok to continue to use the G-tube and if she needs to hold her blood thinners.  Discussed with [name] that patient should continue her medication as usual, if she experiences significant bleeding or other drainage from the G-tube he will call us back so we may further assess the situation. He states understanding and agreement to this plan.  Candiss Norse, PA-C

## 2020-09-29 ENCOUNTER — Encounter: Payer: Self-pay | Admitting: Adult Health

## 2020-09-29 ENCOUNTER — Inpatient Hospital Stay: Payer: Self-pay | Admitting: Adult Health
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
## 2020-10-01 DIAGNOSIS — S51811A Laceration without foreign body of right forearm, initial encounter: Secondary | ICD-10-CM

## 2020-10-01 DIAGNOSIS — E785 Hyperlipidemia, unspecified: Secondary | ICD-10-CM | POA: Insufficient documentation

## 2020-10-01 DIAGNOSIS — R81 Glycosuria: Secondary | ICD-10-CM | POA: Insufficient documentation

## 2020-10-01 DIAGNOSIS — E1169 Type 2 diabetes mellitus with other specified complication: Secondary | ICD-10-CM

## 2020-10-01 DIAGNOSIS — Z86718 Personal history of other venous thrombosis and embolism: Secondary | ICD-10-CM

## 2020-10-01 HISTORY — DX: Personal history of other venous thrombosis and embolism: Z86.718

## 2020-10-01 HISTORY — DX: Glycosuria: R81

## 2020-10-01 HISTORY — DX: Type 2 diabetes mellitus with other specified complication: E11.69

## 2020-10-01 HISTORY — DX: Hyperlipidemia, unspecified: E78.5

## 2020-10-01 HISTORY — DX: Laceration without foreign body of right forearm, initial encounter: S51.811A

## 2020-11-11 ENCOUNTER — Inpatient Hospital Stay: Payer: Medicare Other | Admitting: Adult Health

## 2020-11-12 ENCOUNTER — Other Ambulatory Visit: Payer: Self-pay

## 2020-11-12 NOTE — Patient Outreach (Signed)
[facility]) Care Management  11/12/2020  QUINTINA HAKEEM 1950-07-22 [id number]   Telephone outreach to patient to obtain mRS was successfully completed. MRS= 3   [place] Care Management Assistant

## 2020-11-23 ENCOUNTER — Ambulatory Visit (INDEPENDENT_AMBULATORY_CARE_PROVIDER_SITE_OTHER): Payer: Medicare Other | Admitting: Adult Health

## 2020-11-23 ENCOUNTER — Encounter: Payer: Self-pay | Admitting: Adult Health

## 2020-11-23 VITALS — BP 100/56 | HR 65 | Ht 64.0 in | Wt 189.0 lb

## 2020-11-23 DIAGNOSIS — I63511 Cerebral infarction due to unspecified occlusion or stenosis of right middle cerebral artery: Secondary | ICD-10-CM | POA: Diagnosis not present

## 2020-11-23 DIAGNOSIS — I69320 Aphasia following cerebral infarction: Secondary | ICD-10-CM

## 2020-11-23 DIAGNOSIS — I69398 Other sequelae of cerebral infarction: Secondary | ICD-10-CM

## 2020-11-23 DIAGNOSIS — R1312 Dysphagia, oropharyngeal phase: Secondary | ICD-10-CM

## 2020-11-23 DIAGNOSIS — Z931 Gastrostomy status: Secondary | ICD-10-CM

## 2020-11-23 DIAGNOSIS — R569 Unspecified convulsions: Secondary | ICD-10-CM

## 2020-11-23 NOTE — Patient Instructions (Addendum)
Continue Eliquis (apixaban) daily  and pravastatin  for secondary stroke prevention  Continue to follow up with PCP regarding cholesterol, diabetes and blood pressure management  Maintain strict control of hypertension with blood pressure goal below 130/90, diabetes with hemoglobin A1c goal below 7.0% and cholesterol with LDL cholesterol (bad cholesterol) goal below 70 mg/dL.   Contact physical medicine and rehab to schedule f/u visit  [phone]  Continue working with speech therapies for hopeful ongoing recovery  You will be called to set up visit with nutrition/dietician for further assistance on tube feeding   Continue keppra 500mg  twice daily for seizure prevention     Followup in the future with me in 6 months or call earlier if needed       Thank you for coming to see [place] at [facility]. I hope we have been able to provide you high quality care today.  You may receive a patient satisfaction survey over the next few weeks. We would appreciate your feedback and comments so that we may continue to improve ourselves and the health of our patients.
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
## 2020-11-25 ENCOUNTER — Encounter: Payer: Self-pay | Admitting: Adult Health

## 2020-11-26 NOTE — Progress Notes (Signed)
I agree with the above plan 

## 2021-03-07 ENCOUNTER — Ambulatory Visit (INDEPENDENT_AMBULATORY_CARE_PROVIDER_SITE_OTHER): Payer: Medicare Other | Admitting: Cardiology

## 2021-03-07 ENCOUNTER — Encounter: Payer: Self-pay | Admitting: Cardiology

## 2021-03-07 ENCOUNTER — Other Ambulatory Visit: Payer: Self-pay

## 2021-03-07 VITALS — BP 120/70 | HR 82 | Ht 63.6 in | Wt 170.4 lb

## 2021-03-07 DIAGNOSIS — I10 Essential (primary) hypertension: Secondary | ICD-10-CM | POA: Diagnosis not present

## 2021-03-07 DIAGNOSIS — E1169 Type 2 diabetes mellitus with other specified complication: Secondary | ICD-10-CM | POA: Diagnosis not present

## 2021-03-07 DIAGNOSIS — E0841 Diabetes mellitus due to underlying condition with diabetic mononeuropathy: Secondary | ICD-10-CM

## 2021-03-07 DIAGNOSIS — Z86711 Personal history of pulmonary embolism: Secondary | ICD-10-CM | POA: Diagnosis not present

## 2021-03-07 DIAGNOSIS — E785 Hyperlipidemia, unspecified: Secondary | ICD-10-CM

## 2021-03-07 MED ORDER — ISOSORBIDE DINITRATE 30 MG PO TABS: 30.0000 mg | ORAL_TABLET | Freq: Every day | ORAL | 3 refills | Status: DC
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
## 2021-03-07 NOTE — Patient Instructions (Signed)
Medication Instructions:  Your physician has recommended you make the following change in your medication:   Decrease your Isosorbide to 30 mg daily.  *If you need a refill on your cardiac medications before your next appointment, please call your pharmacy*   Lab Work: None ordered If you have labs (blood work) drawn today and your tests are completely normal, you will receive your results only by: [place] (if you have MyChart) OR A paper copy in the mail If you have any lab test that is abnormal or we need to change your treatment, we will call you to review the results.   Testing/Procedures: None ordered   Follow-Up: At [facility], you and your health needs are our priority.  As part of our continuing mission to provide you with exceptional heart care, we have created designated Provider Care Teams.  These Care Teams include your primary Cardiologist (physician) and Advanced Practice Providers (APPs -  Physician Assistants and Nurse Practitioners) who all work together to provide you with the care you need, when you need it.  We recommend signing up for the patient portal called "MyChart".  Sign up information is provided on this After Visit Summary.  MyChart is used to connect with patients for Virtual Visits (Telemedicine).  Patients are able to view lab/test results, encounter notes, upcoming appointments, etc.  Non-urgent messages can be sent to your provider as well.   To learn more about what you can do with MyChart, go to [email or web address].    Your next appointment:   9 month(s)  The format for your next appointment:   In Person  Provider:   Jyl Heinz, MD   Other Instructions NA

## 2021-03-15 ENCOUNTER — Inpatient Hospital Stay: Payer: Medicare Other | Admitting: Adult Health

## 2021-05-10 ENCOUNTER — Other Ambulatory Visit: Payer: Self-pay | Admitting: Gastroenterology

## 2021-05-10 DIAGNOSIS — D509 Iron deficiency anemia, unspecified: Secondary | ICD-10-CM

## 2021-05-10 DIAGNOSIS — K625 Hemorrhage of anus and rectum: Secondary | ICD-10-CM

## 2021-05-10 DIAGNOSIS — R1013 Epigastric pain: Secondary | ICD-10-CM

## 2021-05-25 ENCOUNTER — Ambulatory Visit (INDEPENDENT_AMBULATORY_CARE_PROVIDER_SITE_OTHER): Payer: Medicare Other | Admitting: Adult Health

## 2021-05-25 ENCOUNTER — Encounter: Payer: Self-pay | Admitting: Adult Health

## 2021-05-25 ENCOUNTER — Telehealth: Payer: Self-pay | Admitting: Adult Health

## 2021-05-25 VITALS — BP 111/65 | HR 72 | Ht 63.5 in | Wt 160.8 lb

## 2021-05-25 DIAGNOSIS — I69398 Other sequelae of cerebral infarction: Secondary | ICD-10-CM | POA: Diagnosis not present

## 2021-05-25 DIAGNOSIS — I63411 Cerebral infarction due to embolism of right middle cerebral artery: Secondary | ICD-10-CM | POA: Diagnosis not present

## 2021-05-25 DIAGNOSIS — Z931 Gastrostomy status: Secondary | ICD-10-CM | POA: Diagnosis not present

## 2021-05-25 DIAGNOSIS — K9423 Gastrostomy malfunction: Secondary | ICD-10-CM

## 2021-05-25 DIAGNOSIS — R569 Unspecified convulsions: Secondary | ICD-10-CM

## 2021-05-25 NOTE — Patient Instructions (Addendum)
Please contact us with the phone number for the place that provides assistance with tube feed ? ?You will be called by a general surgeon to evaluate G tube issues and leaking - consider looking in to purchasing an abdominal binder for G tube to avoid pulling on tube  ? ?Continue keppra '500mg'$  twice daily for seizure prevention ? ? ? ? ?Follow up in 6 months or call earlier if needed ? ?
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
## 2021-05-25 NOTE — Telephone Encounter (Signed)
Referral for General Surgery sent to [facility] [phone]. ?

## 2021-07-23 DIAGNOSIS — I63511 Cerebral infarction due to unspecified occlusion or stenosis of right middle cerebral artery: Secondary | ICD-10-CM | POA: Diagnosis not present

## 2021-07-23 DIAGNOSIS — R4701 Aphasia: Secondary | ICD-10-CM | POA: Diagnosis not present

## 2021-07-23 DIAGNOSIS — R1310 Dysphagia, unspecified: Secondary | ICD-10-CM | POA: Diagnosis not present

## 2021-08-20 DIAGNOSIS — R4701 Aphasia: Secondary | ICD-10-CM | POA: Diagnosis not present

## 2021-08-20 DIAGNOSIS — I63511 Cerebral infarction due to unspecified occlusion or stenosis of right middle cerebral artery: Secondary | ICD-10-CM | POA: Diagnosis not present

## 2021-08-20 DIAGNOSIS — R1310 Dysphagia, unspecified: Secondary | ICD-10-CM | POA: Diagnosis not present

## 2021-08-23 DIAGNOSIS — I63511 Cerebral infarction due to unspecified occlusion or stenosis of right middle cerebral artery: Secondary | ICD-10-CM | POA: Diagnosis not present

## 2021-08-23 DIAGNOSIS — R1310 Dysphagia, unspecified: Secondary | ICD-10-CM | POA: Diagnosis not present

## 2021-08-23 DIAGNOSIS — R4701 Aphasia: Secondary | ICD-10-CM | POA: Diagnosis not present

## 2021-09-15 DIAGNOSIS — I129 Hypertensive chronic kidney disease with stage 1 through stage 4 chronic kidney disease, or unspecified chronic kidney disease: Secondary | ICD-10-CM | POA: Diagnosis not present

## 2021-09-15 DIAGNOSIS — N1832 Chronic kidney disease, stage 3b: Secondary | ICD-10-CM | POA: Diagnosis not present

## 2021-09-15 DIAGNOSIS — Z9181 History of falling: Secondary | ICD-10-CM | POA: Diagnosis not present

## 2021-09-15 DIAGNOSIS — I6930 Unspecified sequelae of cerebral infarction: Secondary | ICD-10-CM | POA: Diagnosis not present

## 2021-09-15 DIAGNOSIS — E782 Mixed hyperlipidemia: Secondary | ICD-10-CM | POA: Diagnosis not present

## 2021-09-15 DIAGNOSIS — E1122 Type 2 diabetes mellitus with diabetic chronic kidney disease: Secondary | ICD-10-CM | POA: Diagnosis not present

## 2021-09-20 DIAGNOSIS — I63511 Cerebral infarction due to unspecified occlusion or stenosis of right middle cerebral artery: Secondary | ICD-10-CM | POA: Diagnosis not present

## 2021-09-20 DIAGNOSIS — R4701 Aphasia: Secondary | ICD-10-CM | POA: Diagnosis not present

## 2021-09-20 DIAGNOSIS — R1310 Dysphagia, unspecified: Secondary | ICD-10-CM | POA: Diagnosis not present

## 2021-09-23 DIAGNOSIS — R4701 Aphasia: Secondary | ICD-10-CM | POA: Diagnosis not present

## 2021-09-23 DIAGNOSIS — R1310 Dysphagia, unspecified: Secondary | ICD-10-CM | POA: Diagnosis not present

## 2021-09-23 DIAGNOSIS — I63511 Cerebral infarction due to unspecified occlusion or stenosis of right middle cerebral artery: Secondary | ICD-10-CM | POA: Diagnosis not present

## 2021-10-21 DIAGNOSIS — I63511 Cerebral infarction due to unspecified occlusion or stenosis of right middle cerebral artery: Secondary | ICD-10-CM | POA: Diagnosis not present

## 2021-10-21 DIAGNOSIS — R4701 Aphasia: Secondary | ICD-10-CM | POA: Diagnosis not present

## 2021-10-21 DIAGNOSIS — R1310 Dysphagia, unspecified: Secondary | ICD-10-CM | POA: Diagnosis not present

## 2021-10-23 DIAGNOSIS — R1310 Dysphagia, unspecified: Secondary | ICD-10-CM | POA: Diagnosis not present

## 2021-10-23 DIAGNOSIS — I63511 Cerebral infarction due to unspecified occlusion or stenosis of right middle cerebral artery: Secondary | ICD-10-CM | POA: Diagnosis not present

## 2021-10-23 DIAGNOSIS — R4701 Aphasia: Secondary | ICD-10-CM | POA: Diagnosis not present

## 2021-11-21 DIAGNOSIS — R4701 Aphasia: Secondary | ICD-10-CM | POA: Diagnosis not present

## 2021-11-21 DIAGNOSIS — R1310 Dysphagia, unspecified: Secondary | ICD-10-CM | POA: Diagnosis not present

## 2021-11-21 DIAGNOSIS — I63511 Cerebral infarction due to unspecified occlusion or stenosis of right middle cerebral artery: Secondary | ICD-10-CM | POA: Diagnosis not present

## 2021-11-22 DIAGNOSIS — L03115 Cellulitis of right lower limb: Secondary | ICD-10-CM | POA: Diagnosis not present

## 2021-11-22 DIAGNOSIS — Z6824 Body mass index (BMI) 24.0-24.9, adult: Secondary | ICD-10-CM | POA: Diagnosis not present

## 2021-11-22 DIAGNOSIS — J208 Acute bronchitis due to other specified organisms: Secondary | ICD-10-CM | POA: Diagnosis not present

## 2021-11-23 DIAGNOSIS — R1310 Dysphagia, unspecified: Secondary | ICD-10-CM | POA: Diagnosis not present

## 2021-11-23 DIAGNOSIS — R4701 Aphasia: Secondary | ICD-10-CM | POA: Diagnosis not present

## 2021-11-23 DIAGNOSIS — I63511 Cerebral infarction due to unspecified occlusion or stenosis of right middle cerebral artery: Secondary | ICD-10-CM | POA: Diagnosis not present

## 2021-12-22 DIAGNOSIS — R1310 Dysphagia, unspecified: Secondary | ICD-10-CM | POA: Diagnosis not present

## 2021-12-22 DIAGNOSIS — R4701 Aphasia: Secondary | ICD-10-CM | POA: Diagnosis not present

## 2021-12-22 DIAGNOSIS — I63511 Cerebral infarction due to unspecified occlusion or stenosis of right middle cerebral artery: Secondary | ICD-10-CM | POA: Diagnosis not present

## 2021-12-23 DIAGNOSIS — I63511 Cerebral infarction due to unspecified occlusion or stenosis of right middle cerebral artery: Secondary | ICD-10-CM | POA: Diagnosis not present

## 2021-12-23 DIAGNOSIS — R1310 Dysphagia, unspecified: Secondary | ICD-10-CM | POA: Diagnosis not present

## 2021-12-23 DIAGNOSIS — R4701 Aphasia: Secondary | ICD-10-CM | POA: Diagnosis not present

## 2022-01-13 DIAGNOSIS — I10 Essential (primary) hypertension: Secondary | ICD-10-CM | POA: Diagnosis not present

## 2022-01-13 DIAGNOSIS — E119 Type 2 diabetes mellitus without complications: Secondary | ICD-10-CM | POA: Diagnosis not present

## 2022-01-13 DIAGNOSIS — J45909 Unspecified asthma, uncomplicated: Secondary | ICD-10-CM | POA: Diagnosis not present

## 2022-01-13 DIAGNOSIS — Z8673 Personal history of transient ischemic attack (TIA), and cerebral infarction without residual deficits: Secondary | ICD-10-CM | POA: Diagnosis not present

## 2022-01-13 DIAGNOSIS — S40812A Abrasion of left upper arm, initial encounter: Secondary | ICD-10-CM | POA: Diagnosis not present

## 2022-01-13 DIAGNOSIS — I2510 Atherosclerotic heart disease of native coronary artery without angina pectoris: Secondary | ICD-10-CM | POA: Diagnosis not present

## 2022-01-13 DIAGNOSIS — S20212A Contusion of left front wall of thorax, initial encounter: Secondary | ICD-10-CM | POA: Diagnosis not present

## 2022-01-13 DIAGNOSIS — R079 Chest pain, unspecified: Secondary | ICD-10-CM | POA: Diagnosis not present

## 2022-01-13 DIAGNOSIS — Z89112 Acquired absence of left hand: Secondary | ICD-10-CM | POA: Diagnosis not present

## 2022-01-18 ENCOUNTER — Other Ambulatory Visit: Payer: Self-pay

## 2022-01-25 ENCOUNTER — Encounter: Payer: Self-pay | Admitting: Cardiology

## 2022-01-25 ENCOUNTER — Ambulatory Visit: Payer: Medicare Other | Attending: Cardiology | Admitting: Cardiology

## 2022-01-25 VITALS — BP 108/60 | HR 69 | Ht 63.6 in | Wt 142.4 lb

## 2022-01-25 DIAGNOSIS — Z8673 Personal history of transient ischemic attack (TIA), and cerebral infarction without residual deficits: Secondary | ICD-10-CM | POA: Diagnosis not present

## 2022-01-25 DIAGNOSIS — I10 Essential (primary) hypertension: Secondary | ICD-10-CM

## 2022-01-25 DIAGNOSIS — E782 Mixed hyperlipidemia: Secondary | ICD-10-CM

## 2022-01-25 DIAGNOSIS — E785 Hyperlipidemia, unspecified: Secondary | ICD-10-CM

## 2022-01-25 DIAGNOSIS — Z86711 Personal history of pulmonary embolism: Secondary | ICD-10-CM

## 2022-01-25 DIAGNOSIS — E088 Diabetes mellitus due to underlying condition with unspecified complications: Secondary | ICD-10-CM | POA: Insufficient documentation

## 2022-01-25 DIAGNOSIS — I6601 Occlusion and stenosis of right middle cerebral artery: Secondary | ICD-10-CM

## 2022-01-25 DIAGNOSIS — E1169 Type 2 diabetes mellitus with other specified complication: Secondary | ICD-10-CM

## 2022-01-25 HISTORY — DX: Diabetes mellitus due to underlying condition with unspecified complications: E08.8
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
## 2022-01-25 NOTE — Patient Instructions (Signed)
Medication Instructions:  Your physician recommends that you continue on your current medications as directed. Please refer to the Current Medication list given to you today.  *If you need a refill on your cardiac medications before your next appointment, please call your pharmacy*   Lab Work: None ordered If you have labs (blood work) drawn today and your tests are completely normal, you will receive your results only by: MyChart Message (if you have MyChart) OR A paper copy in the mail If you have any lab test that is abnormal or we need to change your treatment, we will call you to review the results.   Testing/Procedures: None ordered   Follow-Up: At [facility], you and your health needs are our priority.  As part of our continuing mission to provide you with exceptional heart care, we have created designated Provider Care Teams.  These Care Teams include your primary Cardiologist (physician) and Advanced Practice Providers (APPs -  Physician Assistants and Nurse Practitioners) who all work together to provide you with the care you need, when you need it.  We recommend signing up for the patient portal called "MyChart".  Sign up information is provided on this After Visit Summary.  MyChart is used to connect with patients for Virtual Visits (Telemedicine).  Patients are able to view lab/test results, encounter notes, upcoming appointments, etc.  Non-urgent messages can be sent to your provider as well.   To learn more about what you can do with MyChart, go to [email or web address].    Your next appointment:   12 month(s)  The format for your next appointment:   In Person  Provider:   Rajan Revankar, MD    Other Instructions none  Important Information About Sugar      

## 2022-02-09 DIAGNOSIS — J208 Acute bronchitis due to other specified organisms: Secondary | ICD-10-CM | POA: Diagnosis not present

## 2022-02-09 DIAGNOSIS — Z6824 Body mass index (BMI) 24.0-24.9, adult: Secondary | ICD-10-CM | POA: Diagnosis not present

## 2022-02-17 DIAGNOSIS — E119 Type 2 diabetes mellitus without complications: Secondary | ICD-10-CM | POA: Diagnosis not present

## 2022-02-17 DIAGNOSIS — Z8673 Personal history of transient ischemic attack (TIA), and cerebral infarction without residual deficits: Secondary | ICD-10-CM | POA: Diagnosis not present

## 2022-02-17 DIAGNOSIS — J45909 Unspecified asthma, uncomplicated: Secondary | ICD-10-CM | POA: Diagnosis not present

## 2022-02-17 DIAGNOSIS — Z89112 Acquired absence of left hand: Secondary | ICD-10-CM | POA: Diagnosis not present

## 2022-02-17 DIAGNOSIS — S91112A Laceration without foreign body of left great toe without damage to nail, initial encounter: Secondary | ICD-10-CM | POA: Diagnosis not present

## 2022-02-17 DIAGNOSIS — I2510 Atherosclerotic heart disease of native coronary artery without angina pectoris: Secondary | ICD-10-CM | POA: Diagnosis not present

## 2022-02-17 DIAGNOSIS — I10 Essential (primary) hypertension: Secondary | ICD-10-CM | POA: Diagnosis not present

## 2022-02-23 DIAGNOSIS — L03032 Cellulitis of left toe: Secondary | ICD-10-CM | POA: Diagnosis not present

## 2022-02-23 DIAGNOSIS — Z6824 Body mass index (BMI) 24.0-24.9, adult: Secondary | ICD-10-CM | POA: Diagnosis not present

## 2022-02-23 DIAGNOSIS — Z79899 Other long term (current) drug therapy: Secondary | ICD-10-CM | POA: Diagnosis not present

## 2022-03-09 ENCOUNTER — Other Ambulatory Visit: Payer: Self-pay | Admitting: Cardiology

## 2022-03-14 DIAGNOSIS — R609 Edema, unspecified: Secondary | ICD-10-CM | POA: Diagnosis not present

## 2022-03-14 DIAGNOSIS — I639 Cerebral infarction, unspecified: Secondary | ICD-10-CM | POA: Diagnosis not present

## 2022-03-14 DIAGNOSIS — S92352A Displaced fracture of fifth metatarsal bone, left foot, initial encounter for closed fracture: Secondary | ICD-10-CM | POA: Diagnosis not present

## 2022-03-14 DIAGNOSIS — G9389 Other specified disorders of brain: Secondary | ICD-10-CM | POA: Diagnosis not present

## 2022-03-14 DIAGNOSIS — T7411XA Adult physical abuse, confirmed, initial encounter: Secondary | ICD-10-CM | POA: Diagnosis not present

## 2022-03-14 DIAGNOSIS — M25562 Pain in left knee: Secondary | ICD-10-CM | POA: Diagnosis not present

## 2022-03-14 DIAGNOSIS — E222 Syndrome of inappropriate secretion of antidiuretic hormone: Secondary | ICD-10-CM | POA: Diagnosis not present

## 2022-03-14 DIAGNOSIS — S72001A Fracture of unspecified part of neck of right femur, initial encounter for closed fracture: Secondary | ICD-10-CM | POA: Diagnosis not present

## 2022-03-14 DIAGNOSIS — M25561 Pain in right knee: Secondary | ICD-10-CM | POA: Diagnosis not present

## 2022-03-14 DIAGNOSIS — Z96641 Presence of right artificial hip joint: Secondary | ICD-10-CM | POA: Diagnosis not present

## 2022-03-14 DIAGNOSIS — I2510 Atherosclerotic heart disease of native coronary artery without angina pectoris: Secondary | ICD-10-CM | POA: Diagnosis not present

## 2022-03-14 DIAGNOSIS — L03115 Cellulitis of right lower limb: Secondary | ICD-10-CM | POA: Diagnosis not present

## 2022-03-14 DIAGNOSIS — S92402A Displaced unspecified fracture of left great toe, initial encounter for closed fracture: Secondary | ICD-10-CM | POA: Diagnosis not present

## 2022-03-14 DIAGNOSIS — Z471 Aftercare following joint replacement surgery: Secondary | ICD-10-CM | POA: Diagnosis not present

## 2022-03-14 DIAGNOSIS — R509 Fever, unspecified: Secondary | ICD-10-CM | POA: Diagnosis not present

## 2022-03-14 DIAGNOSIS — R9431 Abnormal electrocardiogram [ECG] [EKG]: Secondary | ICD-10-CM | POA: Diagnosis not present

## 2022-03-14 DIAGNOSIS — W19XXXA Unspecified fall, initial encounter: Secondary | ICD-10-CM | POA: Diagnosis not present

## 2022-03-14 DIAGNOSIS — I444 Left anterior fascicular block: Secondary | ICD-10-CM | POA: Diagnosis not present

## 2022-03-14 DIAGNOSIS — G319 Degenerative disease of nervous system, unspecified: Secondary | ICD-10-CM | POA: Diagnosis not present

## 2022-03-14 DIAGNOSIS — S72012A Unspecified intracapsular fracture of left femur, initial encounter for closed fracture: Secondary | ICD-10-CM | POA: Diagnosis not present

## 2022-03-14 DIAGNOSIS — I10 Essential (primary) hypertension: Secondary | ICD-10-CM | POA: Diagnosis not present

## 2022-03-14 DIAGNOSIS — M7989 Other specified soft tissue disorders: Secondary | ICD-10-CM | POA: Diagnosis not present

## 2022-03-14 DIAGNOSIS — S300XXA Contusion of lower back and pelvis, initial encounter: Secondary | ICD-10-CM | POA: Diagnosis not present

## 2022-03-14 DIAGNOSIS — S72011A Unspecified intracapsular fracture of right femur, initial encounter for closed fracture: Secondary | ICD-10-CM | POA: Diagnosis not present

## 2022-03-14 DIAGNOSIS — S7001XA Contusion of right hip, initial encounter: Secondary | ICD-10-CM | POA: Diagnosis not present

## 2022-03-14 DIAGNOSIS — Z743 Need for continuous supervision: Secondary | ICD-10-CM | POA: Diagnosis not present

## 2022-03-15 HISTORY — PX: HIP SURGERY: SHX245

## 2022-03-16 DIAGNOSIS — S72011A Unspecified intracapsular fracture of right femur, initial encounter for closed fracture: Secondary | ICD-10-CM | POA: Diagnosis not present

## 2022-03-16 DIAGNOSIS — E222 Syndrome of inappropriate secretion of antidiuretic hormone: Secondary | ICD-10-CM | POA: Diagnosis not present

## 2022-03-16 DIAGNOSIS — L03115 Cellulitis of right lower limb: Secondary | ICD-10-CM | POA: Diagnosis not present

## 2022-03-16 DIAGNOSIS — Z931 Gastrostomy status: Secondary | ICD-10-CM | POA: Diagnosis not present

## 2022-03-16 DIAGNOSIS — Z4682 Encounter for fitting and adjustment of non-vascular catheter: Secondary | ICD-10-CM | POA: Diagnosis not present

## 2022-03-24 DIAGNOSIS — Z4659 Encounter for fitting and adjustment of other gastrointestinal appliance and device: Secondary | ICD-10-CM | POA: Diagnosis not present

## 2022-03-24 DIAGNOSIS — K9423 Gastrostomy malfunction: Secondary | ICD-10-CM | POA: Diagnosis not present

## 2022-03-24 DIAGNOSIS — Z7409 Other reduced mobility: Secondary | ICD-10-CM | POA: Diagnosis not present

## 2022-03-24 DIAGNOSIS — E618 Deficiency of other specified nutrient elements: Secondary | ICD-10-CM | POA: Diagnosis not present

## 2022-03-24 DIAGNOSIS — L0390 Cellulitis, unspecified: Secondary | ICD-10-CM | POA: Diagnosis not present

## 2022-03-24 DIAGNOSIS — R52 Pain, unspecified: Secondary | ICD-10-CM | POA: Diagnosis not present

## 2022-03-24 DIAGNOSIS — S72001A Fracture of unspecified part of neck of right femur, initial encounter for closed fracture: Secondary | ICD-10-CM | POA: Diagnosis not present

## 2022-03-24 DIAGNOSIS — R2681 Unsteadiness on feet: Secondary | ICD-10-CM | POA: Diagnosis not present

## 2022-03-24 DIAGNOSIS — K9429 Other complications of gastrostomy: Secondary | ICD-10-CM | POA: Diagnosis not present

## 2022-03-24 DIAGNOSIS — L03115 Cellulitis of right lower limb: Secondary | ICD-10-CM | POA: Diagnosis not present

## 2022-03-24 DIAGNOSIS — E612 Magnesium deficiency: Secondary | ICD-10-CM | POA: Diagnosis not present

## 2022-03-24 DIAGNOSIS — Z931 Gastrostomy status: Secondary | ICD-10-CM | POA: Diagnosis not present

## 2022-03-24 DIAGNOSIS — S92352D Displaced fracture of fifth metatarsal bone, left foot, subsequent encounter for fracture with routine healing: Secondary | ICD-10-CM | POA: Diagnosis not present

## 2022-03-24 DIAGNOSIS — S92402D Displaced unspecified fracture of left great toe, subsequent encounter for fracture with routine healing: Secondary | ICD-10-CM | POA: Diagnosis not present

## 2022-03-24 DIAGNOSIS — E222 Syndrome of inappropriate secretion of antidiuretic hormone: Secondary | ICD-10-CM | POA: Diagnosis not present

## 2022-03-24 DIAGNOSIS — S72011A Unspecified intracapsular fracture of right femur, initial encounter for closed fracture: Secondary | ICD-10-CM | POA: Diagnosis not present

## 2022-03-24 DIAGNOSIS — R300 Dysuria: Secondary | ICD-10-CM | POA: Diagnosis not present

## 2022-03-24 DIAGNOSIS — Z741 Need for assistance with personal care: Secondary | ICD-10-CM | POA: Diagnosis not present

## 2022-03-24 DIAGNOSIS — R279 Unspecified lack of coordination: Secondary | ICD-10-CM | POA: Diagnosis not present

## 2022-03-24 DIAGNOSIS — I639 Cerebral infarction, unspecified: Secondary | ICD-10-CM | POA: Diagnosis not present

## 2022-03-24 DIAGNOSIS — N390 Urinary tract infection, site not specified: Secondary | ICD-10-CM | POA: Diagnosis not present

## 2022-03-24 DIAGNOSIS — E871 Hypo-osmolality and hyponatremia: Secondary | ICD-10-CM | POA: Diagnosis not present

## 2022-03-24 DIAGNOSIS — R109 Unspecified abdominal pain: Secondary | ICD-10-CM | POA: Diagnosis not present

## 2022-03-24 DIAGNOSIS — E119 Type 2 diabetes mellitus without complications: Secondary | ICD-10-CM | POA: Diagnosis not present

## 2022-03-24 DIAGNOSIS — S72001D Fracture of unspecified part of neck of right femur, subsequent encounter for closed fracture with routine healing: Secondary | ICD-10-CM | POA: Diagnosis not present

## 2022-03-24 DIAGNOSIS — E860 Dehydration: Secondary | ICD-10-CM | POA: Diagnosis not present

## 2022-03-24 DIAGNOSIS — R5381 Other malaise: Secondary | ICD-10-CM | POA: Diagnosis not present

## 2022-03-24 DIAGNOSIS — Z7401 Bed confinement status: Secondary | ICD-10-CM | POA: Diagnosis not present

## 2022-03-24 DIAGNOSIS — I10 Essential (primary) hypertension: Secondary | ICD-10-CM | POA: Diagnosis not present

## 2022-03-24 DIAGNOSIS — M6281 Muscle weakness (generalized): Secondary | ICD-10-CM | POA: Diagnosis not present

## 2022-03-24 DIAGNOSIS — D649 Anemia, unspecified: Secondary | ICD-10-CM | POA: Diagnosis not present

## 2022-03-24 DIAGNOSIS — R41841 Cognitive communication deficit: Secondary | ICD-10-CM | POA: Diagnosis not present

## 2022-03-24 DIAGNOSIS — I69320 Aphasia following cerebral infarction: Secondary | ICD-10-CM | POA: Diagnosis not present

## 2022-03-27 DIAGNOSIS — S72001D Fracture of unspecified part of neck of right femur, subsequent encounter for closed fracture with routine healing: Secondary | ICD-10-CM | POA: Diagnosis not present

## 2022-03-30 DIAGNOSIS — E860 Dehydration: Secondary | ICD-10-CM | POA: Diagnosis not present

## 2022-04-04 DIAGNOSIS — R300 Dysuria: Secondary | ICD-10-CM | POA: Diagnosis not present

## 2022-04-05 DIAGNOSIS — D649 Anemia, unspecified: Secondary | ICD-10-CM | POA: Diagnosis not present

## 2022-04-06 DIAGNOSIS — R300 Dysuria: Secondary | ICD-10-CM | POA: Diagnosis not present

## 2022-04-11 DIAGNOSIS — R52 Pain, unspecified: Secondary | ICD-10-CM | POA: Diagnosis not present

## 2022-04-12 DIAGNOSIS — Z4659 Encounter for fitting and adjustment of other gastrointestinal appliance and device: Secondary | ICD-10-CM | POA: Diagnosis not present

## 2022-04-12 DIAGNOSIS — K9423 Gastrostomy malfunction: Secondary | ICD-10-CM | POA: Diagnosis not present

## 2022-04-12 DIAGNOSIS — K9429 Other complications of gastrostomy: Secondary | ICD-10-CM | POA: Diagnosis not present

## 2022-04-18 DIAGNOSIS — E860 Dehydration: Secondary | ICD-10-CM | POA: Diagnosis not present

## 2022-04-19 ENCOUNTER — Telehealth: Payer: Self-pay

## 2022-04-19 NOTE — Telephone Encounter (Signed)
        Patient  visited [facility] on 3/20   Telephone encounter attempt :  1st Unable to Leave Message Patient declined call   [place], [place] [phone], [place], [place] [address] Phone: [phone] Email: [email or web address]

## 2022-04-25 DIAGNOSIS — S72001A Fracture of unspecified part of neck of right femur, initial encounter for closed fracture: Secondary | ICD-10-CM | POA: Diagnosis not present

## 2022-04-26 DIAGNOSIS — I639 Cerebral infarction, unspecified: Secondary | ICD-10-CM | POA: Diagnosis not present

## 2022-05-01 DIAGNOSIS — Z7409 Other reduced mobility: Secondary | ICD-10-CM | POA: Diagnosis not present

## 2022-05-01 DIAGNOSIS — Z741 Need for assistance with personal care: Secondary | ICD-10-CM | POA: Diagnosis not present

## 2022-05-01 DIAGNOSIS — M6281 Muscle weakness (generalized): Secondary | ICD-10-CM | POA: Diagnosis not present

## 2022-05-01 DIAGNOSIS — R2681 Unsteadiness on feet: Secondary | ICD-10-CM | POA: Diagnosis not present

## 2022-05-02 DIAGNOSIS — Z741 Need for assistance with personal care: Secondary | ICD-10-CM | POA: Diagnosis not present

## 2022-05-02 DIAGNOSIS — R2681 Unsteadiness on feet: Secondary | ICD-10-CM | POA: Diagnosis not present

## 2022-05-02 DIAGNOSIS — M6281 Muscle weakness (generalized): Secondary | ICD-10-CM | POA: Diagnosis not present

## 2022-05-02 DIAGNOSIS — Z7409 Other reduced mobility: Secondary | ICD-10-CM | POA: Diagnosis not present

## 2022-05-04 DIAGNOSIS — M6281 Muscle weakness (generalized): Secondary | ICD-10-CM | POA: Diagnosis not present

## 2022-05-04 DIAGNOSIS — R2681 Unsteadiness on feet: Secondary | ICD-10-CM | POA: Diagnosis not present

## 2022-05-04 DIAGNOSIS — Z741 Need for assistance with personal care: Secondary | ICD-10-CM | POA: Diagnosis not present

## 2022-05-04 DIAGNOSIS — Z7409 Other reduced mobility: Secondary | ICD-10-CM | POA: Diagnosis not present

## 2022-05-05 DIAGNOSIS — Z7409 Other reduced mobility: Secondary | ICD-10-CM | POA: Diagnosis not present

## 2022-05-05 DIAGNOSIS — R2681 Unsteadiness on feet: Secondary | ICD-10-CM | POA: Diagnosis not present

## 2022-05-05 DIAGNOSIS — Z741 Need for assistance with personal care: Secondary | ICD-10-CM | POA: Diagnosis not present

## 2022-05-05 DIAGNOSIS — M6281 Muscle weakness (generalized): Secondary | ICD-10-CM | POA: Diagnosis not present

## 2022-05-06 DIAGNOSIS — R079 Chest pain, unspecified: Secondary | ICD-10-CM | POA: Diagnosis not present

## 2022-05-06 DIAGNOSIS — I4510 Unspecified right bundle-branch block: Secondary | ICD-10-CM | POA: Diagnosis not present

## 2022-05-06 DIAGNOSIS — Z79899 Other long term (current) drug therapy: Secondary | ICD-10-CM | POA: Diagnosis not present

## 2022-05-06 DIAGNOSIS — I10 Essential (primary) hypertension: Secondary | ICD-10-CM | POA: Diagnosis not present

## 2022-05-06 DIAGNOSIS — I639 Cerebral infarction, unspecified: Secondary | ICD-10-CM | POA: Diagnosis not present

## 2022-05-06 DIAGNOSIS — R000 Tachycardia, unspecified: Secondary | ICD-10-CM | POA: Diagnosis not present

## 2022-05-06 DIAGNOSIS — R9431 Abnormal electrocardiogram [ECG] [EKG]: Secondary | ICD-10-CM | POA: Diagnosis not present

## 2022-05-06 DIAGNOSIS — Z7901 Long term (current) use of anticoagulants: Secondary | ICD-10-CM | POA: Diagnosis not present

## 2022-05-06 DIAGNOSIS — R0789 Other chest pain: Secondary | ICD-10-CM | POA: Diagnosis not present

## 2022-05-06 DIAGNOSIS — I444 Left anterior fascicular block: Secondary | ICD-10-CM | POA: Diagnosis not present

## 2022-05-06 DIAGNOSIS — Z743 Need for continuous supervision: Secondary | ICD-10-CM | POA: Diagnosis not present

## 2022-05-11 DIAGNOSIS — M6281 Muscle weakness (generalized): Secondary | ICD-10-CM | POA: Diagnosis not present

## 2022-05-11 DIAGNOSIS — Z741 Need for assistance with personal care: Secondary | ICD-10-CM | POA: Diagnosis not present

## 2022-05-11 DIAGNOSIS — Z7409 Other reduced mobility: Secondary | ICD-10-CM | POA: Diagnosis not present

## 2022-05-11 DIAGNOSIS — R2681 Unsteadiness on feet: Secondary | ICD-10-CM | POA: Diagnosis not present

## 2022-05-12 DIAGNOSIS — R2681 Unsteadiness on feet: Secondary | ICD-10-CM | POA: Diagnosis not present

## 2022-05-12 DIAGNOSIS — M6281 Muscle weakness (generalized): Secondary | ICD-10-CM | POA: Diagnosis not present

## 2022-05-12 DIAGNOSIS — Z7409 Other reduced mobility: Secondary | ICD-10-CM | POA: Diagnosis not present

## 2022-05-12 DIAGNOSIS — Z741 Need for assistance with personal care: Secondary | ICD-10-CM | POA: Diagnosis not present

## 2022-05-14 DIAGNOSIS — R2681 Unsteadiness on feet: Secondary | ICD-10-CM | POA: Diagnosis not present

## 2022-05-14 DIAGNOSIS — M6281 Muscle weakness (generalized): Secondary | ICD-10-CM | POA: Diagnosis not present

## 2022-05-14 DIAGNOSIS — Z741 Need for assistance with personal care: Secondary | ICD-10-CM | POA: Diagnosis not present

## 2022-05-14 DIAGNOSIS — Z7409 Other reduced mobility: Secondary | ICD-10-CM | POA: Diagnosis not present

## 2022-05-15 DIAGNOSIS — R2681 Unsteadiness on feet: Secondary | ICD-10-CM | POA: Diagnosis not present

## 2022-05-15 DIAGNOSIS — M6281 Muscle weakness (generalized): Secondary | ICD-10-CM | POA: Diagnosis not present

## 2022-05-15 DIAGNOSIS — Z741 Need for assistance with personal care: Secondary | ICD-10-CM | POA: Diagnosis not present

## 2022-05-15 DIAGNOSIS — Z7409 Other reduced mobility: Secondary | ICD-10-CM | POA: Diagnosis not present

## 2022-05-16 DIAGNOSIS — R2681 Unsteadiness on feet: Secondary | ICD-10-CM | POA: Diagnosis not present

## 2022-05-16 DIAGNOSIS — M6281 Muscle weakness (generalized): Secondary | ICD-10-CM | POA: Diagnosis not present

## 2022-05-16 DIAGNOSIS — Z7409 Other reduced mobility: Secondary | ICD-10-CM | POA: Diagnosis not present

## 2022-05-16 DIAGNOSIS — Z741 Need for assistance with personal care: Secondary | ICD-10-CM | POA: Diagnosis not present

## 2022-05-17 DIAGNOSIS — Z741 Need for assistance with personal care: Secondary | ICD-10-CM | POA: Diagnosis not present

## 2022-05-17 DIAGNOSIS — M6281 Muscle weakness (generalized): Secondary | ICD-10-CM | POA: Diagnosis not present

## 2022-05-17 DIAGNOSIS — Z7409 Other reduced mobility: Secondary | ICD-10-CM | POA: Diagnosis not present

## 2022-05-17 DIAGNOSIS — R2681 Unsteadiness on feet: Secondary | ICD-10-CM | POA: Diagnosis not present

## 2022-05-18 DIAGNOSIS — R319 Hematuria, unspecified: Secondary | ICD-10-CM | POA: Diagnosis not present

## 2022-05-19 DIAGNOSIS — Z931 Gastrostomy status: Secondary | ICD-10-CM | POA: Diagnosis not present

## 2022-05-19 DIAGNOSIS — M6281 Muscle weakness (generalized): Secondary | ICD-10-CM | POA: Diagnosis not present

## 2022-05-19 DIAGNOSIS — Z7409 Other reduced mobility: Secondary | ICD-10-CM | POA: Diagnosis not present

## 2022-05-19 DIAGNOSIS — Z741 Need for assistance with personal care: Secondary | ICD-10-CM | POA: Diagnosis not present

## 2022-05-19 DIAGNOSIS — R2681 Unsteadiness on feet: Secondary | ICD-10-CM | POA: Diagnosis not present

## 2022-05-19 DIAGNOSIS — I10 Essential (primary) hypertension: Secondary | ICD-10-CM | POA: Diagnosis not present

## 2022-05-20 DIAGNOSIS — R197 Diarrhea, unspecified: Secondary | ICD-10-CM | POA: Diagnosis not present

## 2022-05-20 DIAGNOSIS — N390 Urinary tract infection, site not specified: Secondary | ICD-10-CM | POA: Diagnosis not present

## 2022-05-22 DIAGNOSIS — R2681 Unsteadiness on feet: Secondary | ICD-10-CM | POA: Diagnosis not present

## 2022-05-22 DIAGNOSIS — Z7409 Other reduced mobility: Secondary | ICD-10-CM | POA: Diagnosis not present

## 2022-05-22 DIAGNOSIS — M6281 Muscle weakness (generalized): Secondary | ICD-10-CM | POA: Diagnosis not present

## 2022-05-22 DIAGNOSIS — Z741 Need for assistance with personal care: Secondary | ICD-10-CM | POA: Diagnosis not present

## 2022-05-23 DIAGNOSIS — K625 Hemorrhage of anus and rectum: Secondary | ICD-10-CM | POA: Diagnosis not present

## 2022-05-23 DIAGNOSIS — M6281 Muscle weakness (generalized): Secondary | ICD-10-CM | POA: Diagnosis not present

## 2022-05-23 DIAGNOSIS — Z741 Need for assistance with personal care: Secondary | ICD-10-CM | POA: Diagnosis not present

## 2022-05-23 DIAGNOSIS — R2681 Unsteadiness on feet: Secondary | ICD-10-CM | POA: Diagnosis not present

## 2022-05-23 DIAGNOSIS — Z7409 Other reduced mobility: Secondary | ICD-10-CM | POA: Diagnosis not present

## 2022-05-24 DIAGNOSIS — R2681 Unsteadiness on feet: Secondary | ICD-10-CM | POA: Diagnosis not present

## 2022-05-24 DIAGNOSIS — M6281 Muscle weakness (generalized): Secondary | ICD-10-CM | POA: Diagnosis not present

## 2022-05-24 DIAGNOSIS — Z7409 Other reduced mobility: Secondary | ICD-10-CM | POA: Diagnosis not present

## 2022-05-24 DIAGNOSIS — Z741 Need for assistance with personal care: Secondary | ICD-10-CM | POA: Diagnosis not present

## 2022-05-25 DIAGNOSIS — M6281 Muscle weakness (generalized): Secondary | ICD-10-CM | POA: Diagnosis not present

## 2022-05-25 DIAGNOSIS — Z741 Need for assistance with personal care: Secondary | ICD-10-CM | POA: Diagnosis not present

## 2022-05-25 DIAGNOSIS — Z7409 Other reduced mobility: Secondary | ICD-10-CM | POA: Diagnosis not present

## 2022-05-25 DIAGNOSIS — R2681 Unsteadiness on feet: Secondary | ICD-10-CM | POA: Diagnosis not present

## 2022-05-26 DIAGNOSIS — M6281 Muscle weakness (generalized): Secondary | ICD-10-CM | POA: Diagnosis not present

## 2022-05-26 DIAGNOSIS — Z7409 Other reduced mobility: Secondary | ICD-10-CM | POA: Diagnosis not present

## 2022-05-26 DIAGNOSIS — Z741 Need for assistance with personal care: Secondary | ICD-10-CM | POA: Diagnosis not present

## 2022-05-26 DIAGNOSIS — R2681 Unsteadiness on feet: Secondary | ICD-10-CM | POA: Diagnosis not present

## 2022-05-29 DIAGNOSIS — N390 Urinary tract infection, site not specified: Secondary | ICD-10-CM | POA: Diagnosis not present

## 2022-05-31 DIAGNOSIS — E8770 Fluid overload, unspecified: Secondary | ICD-10-CM | POA: Diagnosis not present

## 2022-05-31 DIAGNOSIS — R051 Acute cough: Secondary | ICD-10-CM | POA: Diagnosis not present

## 2022-05-31 DIAGNOSIS — R059 Cough, unspecified: Secondary | ICD-10-CM | POA: Diagnosis not present

## 2022-06-01 DIAGNOSIS — N182 Chronic kidney disease, stage 2 (mild): Secondary | ICD-10-CM | POA: Diagnosis not present

## 2022-06-06 DIAGNOSIS — K219 Gastro-esophageal reflux disease without esophagitis: Secondary | ICD-10-CM | POA: Diagnosis not present

## 2022-06-07 DIAGNOSIS — Z7189 Other specified counseling: Secondary | ICD-10-CM | POA: Diagnosis not present

## 2022-06-08 DIAGNOSIS — E871 Hypo-osmolality and hyponatremia: Secondary | ICD-10-CM | POA: Diagnosis not present

## 2022-06-19 DIAGNOSIS — R404 Transient alteration of awareness: Secondary | ICD-10-CM | POA: Diagnosis not present

## 2022-06-19 DIAGNOSIS — R279 Unspecified lack of coordination: Secondary | ICD-10-CM | POA: Diagnosis not present

## 2022-06-19 DIAGNOSIS — K9423 Gastrostomy malfunction: Secondary | ICD-10-CM | POA: Diagnosis not present

## 2022-06-19 DIAGNOSIS — Z4659 Encounter for fitting and adjustment of other gastrointestinal appliance and device: Secondary | ICD-10-CM | POA: Diagnosis not present

## 2022-06-19 DIAGNOSIS — R58 Hemorrhage, not elsewhere classified: Secondary | ICD-10-CM | POA: Diagnosis not present

## 2022-06-19 DIAGNOSIS — Z743 Need for continuous supervision: Secondary | ICD-10-CM | POA: Diagnosis not present

## 2022-06-19 DIAGNOSIS — Z7401 Bed confinement status: Secondary | ICD-10-CM | POA: Diagnosis not present

## 2022-06-20 DIAGNOSIS — Z7401 Bed confinement status: Secondary | ICD-10-CM | POA: Diagnosis not present

## 2022-06-20 DIAGNOSIS — Z4659 Encounter for fitting and adjustment of other gastrointestinal appliance and device: Secondary | ICD-10-CM | POA: Diagnosis not present

## 2022-06-20 DIAGNOSIS — K9429 Other complications of gastrostomy: Secondary | ICD-10-CM | POA: Diagnosis not present

## 2022-06-20 DIAGNOSIS — G8929 Other chronic pain: Secondary | ICD-10-CM | POA: Diagnosis not present

## 2022-06-20 DIAGNOSIS — R0902 Hypoxemia: Secondary | ICD-10-CM | POA: Diagnosis not present

## 2022-06-20 DIAGNOSIS — R279 Unspecified lack of coordination: Secondary | ICD-10-CM | POA: Diagnosis not present

## 2022-06-20 DIAGNOSIS — Z789 Other specified health status: Secondary | ICD-10-CM | POA: Diagnosis not present

## 2022-06-20 DIAGNOSIS — Z743 Need for continuous supervision: Secondary | ICD-10-CM | POA: Diagnosis not present

## 2022-06-29 DIAGNOSIS — I639 Cerebral infarction, unspecified: Secondary | ICD-10-CM | POA: Diagnosis not present

## 2022-07-06 DIAGNOSIS — G40909 Epilepsy, unspecified, not intractable, without status epilepticus: Secondary | ICD-10-CM | POA: Diagnosis not present

## 2022-07-06 DIAGNOSIS — E871 Hypo-osmolality and hyponatremia: Secondary | ICD-10-CM | POA: Diagnosis not present

## 2022-07-11 DIAGNOSIS — F32A Depression, unspecified: Secondary | ICD-10-CM | POA: Diagnosis not present

## 2022-07-22 DIAGNOSIS — I517 Cardiomegaly: Secondary | ICD-10-CM | POA: Diagnosis not present

## 2022-07-22 DIAGNOSIS — E870 Hyperosmolality and hypernatremia: Secondary | ICD-10-CM | POA: Diagnosis not present

## 2022-07-22 DIAGNOSIS — R932 Abnormal findings on diagnostic imaging of liver and biliary tract: Secondary | ICD-10-CM | POA: Diagnosis not present

## 2022-07-22 DIAGNOSIS — R000 Tachycardia, unspecified: Secondary | ICD-10-CM | POA: Diagnosis not present

## 2022-07-22 DIAGNOSIS — N390 Urinary tract infection, site not specified: Secondary | ICD-10-CM | POA: Diagnosis not present

## 2022-07-22 DIAGNOSIS — I21A1 Myocardial infarction type 2: Secondary | ICD-10-CM | POA: Diagnosis not present

## 2022-07-22 DIAGNOSIS — I10 Essential (primary) hypertension: Secondary | ICD-10-CM | POA: Diagnosis not present

## 2022-07-22 DIAGNOSIS — R0902 Hypoxemia: Secondary | ICD-10-CM | POA: Diagnosis not present

## 2022-07-22 DIAGNOSIS — N179 Acute kidney failure, unspecified: Secondary | ICD-10-CM | POA: Diagnosis not present

## 2022-07-22 DIAGNOSIS — I4510 Unspecified right bundle-branch block: Secondary | ICD-10-CM | POA: Diagnosis not present

## 2022-07-22 DIAGNOSIS — R918 Other nonspecific abnormal finding of lung field: Secondary | ICD-10-CM | POA: Diagnosis not present

## 2022-07-22 DIAGNOSIS — E1165 Type 2 diabetes mellitus with hyperglycemia: Secondary | ICD-10-CM | POA: Diagnosis not present

## 2022-07-22 DIAGNOSIS — R590 Localized enlarged lymph nodes: Secondary | ICD-10-CM | POA: Diagnosis not present

## 2022-07-22 DIAGNOSIS — R0602 Shortness of breath: Secondary | ICD-10-CM | POA: Diagnosis not present

## 2022-07-22 DIAGNOSIS — A419 Sepsis, unspecified organism: Secondary | ICD-10-CM | POA: Diagnosis not present

## 2022-07-22 DIAGNOSIS — E7800 Pure hypercholesterolemia, unspecified: Secondary | ICD-10-CM | POA: Diagnosis not present

## 2022-07-22 DIAGNOSIS — D513 Other dietary vitamin B12 deficiency anemia: Secondary | ICD-10-CM | POA: Diagnosis not present

## 2022-07-22 DIAGNOSIS — I495 Sick sinus syndrome: Secondary | ICD-10-CM | POA: Diagnosis not present

## 2022-07-22 DIAGNOSIS — R0682 Tachypnea, not elsewhere classified: Secondary | ICD-10-CM | POA: Diagnosis not present

## 2022-07-22 DIAGNOSIS — J9601 Acute respiratory failure with hypoxia: Secondary | ICD-10-CM | POA: Diagnosis not present

## 2022-07-22 DIAGNOSIS — E860 Dehydration: Secondary | ICD-10-CM | POA: Diagnosis not present

## 2022-07-22 DIAGNOSIS — D509 Iron deficiency anemia, unspecified: Secondary | ICD-10-CM | POA: Diagnosis not present

## 2022-07-22 DIAGNOSIS — J189 Pneumonia, unspecified organism: Secondary | ICD-10-CM | POA: Diagnosis not present

## 2022-07-22 DIAGNOSIS — I4891 Unspecified atrial fibrillation: Secondary | ICD-10-CM | POA: Diagnosis not present

## 2022-07-22 DIAGNOSIS — R5383 Other fatigue: Secondary | ICD-10-CM | POA: Diagnosis not present

## 2022-07-22 DIAGNOSIS — Z452 Encounter for adjustment and management of vascular access device: Secondary | ICD-10-CM | POA: Diagnosis not present

## 2022-07-22 DIAGNOSIS — E875 Hyperkalemia: Secondary | ICD-10-CM | POA: Diagnosis not present

## 2022-07-22 DIAGNOSIS — I444 Left anterior fascicular block: Secondary | ICD-10-CM | POA: Diagnosis not present

## 2022-07-22 DIAGNOSIS — I700 Atherosclerosis of aorta: Secondary | ICD-10-CM | POA: Diagnosis not present

## 2022-07-24 DIAGNOSIS — R932 Abnormal findings on diagnostic imaging of liver and biliary tract: Secondary | ICD-10-CM | POA: Diagnosis not present

## 2022-07-24 DIAGNOSIS — A419 Sepsis, unspecified organism: Secondary | ICD-10-CM | POA: Diagnosis not present

## 2022-07-24 DIAGNOSIS — E870 Hyperosmolality and hypernatremia: Secondary | ICD-10-CM | POA: Diagnosis not present

## 2022-07-24 DIAGNOSIS — I21A1 Myocardial infarction type 2: Secondary | ICD-10-CM | POA: Diagnosis not present

## 2022-07-25 DIAGNOSIS — I21A1 Myocardial infarction type 2: Secondary | ICD-10-CM | POA: Diagnosis not present

## 2022-07-25 DIAGNOSIS — Z452 Encounter for adjustment and management of vascular access device: Secondary | ICD-10-CM | POA: Diagnosis not present

## 2022-07-25 DIAGNOSIS — E870 Hyperosmolality and hypernatremia: Secondary | ICD-10-CM | POA: Diagnosis not present

## 2022-07-25 DIAGNOSIS — A419 Sepsis, unspecified organism: Secondary | ICD-10-CM | POA: Diagnosis not present

## 2022-07-25 DIAGNOSIS — R932 Abnormal findings on diagnostic imaging of liver and biliary tract: Secondary | ICD-10-CM | POA: Diagnosis not present

## 2022-07-26 DIAGNOSIS — Z452 Encounter for adjustment and management of vascular access device: Secondary | ICD-10-CM | POA: Diagnosis not present

## 2022-07-26 DIAGNOSIS — E870 Hyperosmolality and hypernatremia: Secondary | ICD-10-CM | POA: Diagnosis not present

## 2022-07-26 DIAGNOSIS — I4510 Unspecified right bundle-branch block: Secondary | ICD-10-CM | POA: Diagnosis not present

## 2022-07-26 DIAGNOSIS — A419 Sepsis, unspecified organism: Secondary | ICD-10-CM | POA: Diagnosis not present

## 2022-07-26 DIAGNOSIS — R918 Other nonspecific abnormal finding of lung field: Secondary | ICD-10-CM | POA: Diagnosis not present

## 2022-07-26 DIAGNOSIS — I21A1 Myocardial infarction type 2: Secondary | ICD-10-CM | POA: Diagnosis not present

## 2022-07-27 DIAGNOSIS — I4510 Unspecified right bundle-branch block: Secondary | ICD-10-CM | POA: Diagnosis not present

## 2022-07-29 DIAGNOSIS — I444 Left anterior fascicular block: Secondary | ICD-10-CM | POA: Diagnosis not present

## 2022-07-29 DIAGNOSIS — Z452 Encounter for adjustment and management of vascular access device: Secondary | ICD-10-CM | POA: Diagnosis not present

## 2022-07-29 DIAGNOSIS — I4891 Unspecified atrial fibrillation: Secondary | ICD-10-CM | POA: Diagnosis not present

## 2022-07-29 DIAGNOSIS — I21A1 Myocardial infarction type 2: Secondary | ICD-10-CM | POA: Diagnosis not present

## 2022-07-29 DIAGNOSIS — R0902 Hypoxemia: Secondary | ICD-10-CM | POA: Diagnosis not present

## 2022-07-29 DIAGNOSIS — I4510 Unspecified right bundle-branch block: Secondary | ICD-10-CM | POA: Diagnosis not present

## 2022-07-29 DIAGNOSIS — E870 Hyperosmolality and hypernatremia: Secondary | ICD-10-CM | POA: Diagnosis not present

## 2022-07-29 DIAGNOSIS — R000 Tachycardia, unspecified: Secondary | ICD-10-CM | POA: Diagnosis not present

## 2022-07-29 DIAGNOSIS — A419 Sepsis, unspecified organism: Secondary | ICD-10-CM | POA: Diagnosis not present

## 2022-07-29 DIAGNOSIS — R918 Other nonspecific abnormal finding of lung field: Secondary | ICD-10-CM | POA: Diagnosis not present

## 2022-07-30 DIAGNOSIS — R0682 Tachypnea, not elsewhere classified: Secondary | ICD-10-CM | POA: Diagnosis not present

## 2022-07-30 DIAGNOSIS — R0602 Shortness of breath: Secondary | ICD-10-CM | POA: Diagnosis not present

## 2022-07-30 DIAGNOSIS — E870 Hyperosmolality and hypernatremia: Secondary | ICD-10-CM | POA: Diagnosis not present

## 2022-07-30 DIAGNOSIS — R590 Localized enlarged lymph nodes: Secondary | ICD-10-CM | POA: Diagnosis not present

## 2022-07-30 DIAGNOSIS — A419 Sepsis, unspecified organism: Secondary | ICD-10-CM | POA: Diagnosis not present

## 2022-07-30 DIAGNOSIS — R0902 Hypoxemia: Secondary | ICD-10-CM | POA: Diagnosis not present

## 2022-07-30 DIAGNOSIS — R918 Other nonspecific abnormal finding of lung field: Secondary | ICD-10-CM | POA: Diagnosis not present

## 2022-07-30 DIAGNOSIS — I21A1 Myocardial infarction type 2: Secondary | ICD-10-CM | POA: Diagnosis not present

## 2022-07-31 DIAGNOSIS — I495 Sick sinus syndrome: Secondary | ICD-10-CM | POA: Diagnosis not present

## 2022-07-31 DIAGNOSIS — A419 Sepsis, unspecified organism: Secondary | ICD-10-CM | POA: Diagnosis not present

## 2022-07-31 DIAGNOSIS — E870 Hyperosmolality and hypernatremia: Secondary | ICD-10-CM | POA: Diagnosis not present

## 2022-07-31 DIAGNOSIS — I21A1 Myocardial infarction type 2: Secondary | ICD-10-CM | POA: Diagnosis not present

## 2022-07-31 DIAGNOSIS — I4891 Unspecified atrial fibrillation: Secondary | ICD-10-CM | POA: Diagnosis not present

## 2022-08-01 DIAGNOSIS — I495 Sick sinus syndrome: Secondary | ICD-10-CM | POA: Diagnosis not present

## 2022-08-01 DIAGNOSIS — I4891 Unspecified atrial fibrillation: Secondary | ICD-10-CM | POA: Diagnosis not present

## 2022-08-01 DIAGNOSIS — I21A1 Myocardial infarction type 2: Secondary | ICD-10-CM | POA: Diagnosis not present

## 2022-08-02 DIAGNOSIS — I21A1 Myocardial infarction type 2: Secondary | ICD-10-CM | POA: Diagnosis not present

## 2022-08-02 DIAGNOSIS — I4891 Unspecified atrial fibrillation: Secondary | ICD-10-CM | POA: Diagnosis not present

## 2022-08-02 DIAGNOSIS — A419 Sepsis, unspecified organism: Secondary | ICD-10-CM | POA: Diagnosis not present

## 2022-08-02 DIAGNOSIS — I495 Sick sinus syndrome: Secondary | ICD-10-CM | POA: Diagnosis not present

## 2022-08-02 DIAGNOSIS — E870 Hyperosmolality and hypernatremia: Secondary | ICD-10-CM | POA: Diagnosis not present

## 2022-08-07 ENCOUNTER — Other Ambulatory Visit: Payer: Self-pay

## 2022-08-07 DIAGNOSIS — F419 Anxiety disorder, unspecified: Secondary | ICD-10-CM | POA: Diagnosis not present

## 2022-08-07 DIAGNOSIS — R1084 Generalized abdominal pain: Secondary | ICD-10-CM | POA: Diagnosis not present

## 2022-08-07 DIAGNOSIS — E119 Type 2 diabetes mellitus without complications: Secondary | ICD-10-CM | POA: Diagnosis not present

## 2022-08-07 DIAGNOSIS — D649 Anemia, unspecified: Secondary | ICD-10-CM | POA: Diagnosis not present

## 2022-08-08 DIAGNOSIS — E119 Type 2 diabetes mellitus without complications: Secondary | ICD-10-CM | POA: Diagnosis not present

## 2022-08-10 ENCOUNTER — Encounter: Payer: Self-pay | Admitting: Cardiology

## 2022-08-10 DIAGNOSIS — E871 Hypo-osmolality and hyponatremia: Secondary | ICD-10-CM

## 2022-08-10 DIAGNOSIS — I495 Sick sinus syndrome: Secondary | ICD-10-CM | POA: Insufficient documentation

## 2022-08-10 DIAGNOSIS — I21A1 Myocardial infarction type 2: Secondary | ICD-10-CM

## 2022-08-10 DIAGNOSIS — E119 Type 2 diabetes mellitus without complications: Secondary | ICD-10-CM | POA: Diagnosis not present

## 2022-08-11 DIAGNOSIS — R001 Bradycardia, unspecified: Secondary | ICD-10-CM | POA: Diagnosis not present

## 2022-08-11 DIAGNOSIS — Z931 Gastrostomy status: Secondary | ICD-10-CM | POA: Diagnosis not present

## 2022-08-11 DIAGNOSIS — E860 Dehydration: Secondary | ICD-10-CM | POA: Diagnosis not present

## 2022-08-11 DIAGNOSIS — R031 Nonspecific low blood-pressure reading: Secondary | ICD-10-CM | POA: Diagnosis not present

## 2022-08-11 DIAGNOSIS — D649 Anemia, unspecified: Secondary | ICD-10-CM | POA: Diagnosis not present

## 2022-08-14 DIAGNOSIS — F32A Depression, unspecified: Secondary | ICD-10-CM | POA: Diagnosis not present

## 2022-08-14 DIAGNOSIS — D649 Anemia, unspecified: Secondary | ICD-10-CM | POA: Diagnosis not present

## 2022-08-14 DIAGNOSIS — I4891 Unspecified atrial fibrillation: Secondary | ICD-10-CM | POA: Diagnosis not present

## 2022-08-14 DIAGNOSIS — K219 Gastro-esophageal reflux disease without esophagitis: Secondary | ICD-10-CM | POA: Diagnosis not present

## 2022-08-14 DIAGNOSIS — R079 Chest pain, unspecified: Secondary | ICD-10-CM | POA: Diagnosis not present

## 2022-08-14 DIAGNOSIS — J45909 Unspecified asthma, uncomplicated: Secondary | ICD-10-CM | POA: Diagnosis not present

## 2022-08-14 DIAGNOSIS — I252 Old myocardial infarction: Secondary | ICD-10-CM | POA: Diagnosis not present

## 2022-08-14 DIAGNOSIS — I10 Essential (primary) hypertension: Secondary | ICD-10-CM | POA: Diagnosis not present

## 2022-08-14 DIAGNOSIS — R0789 Other chest pain: Secondary | ICD-10-CM | POA: Diagnosis not present

## 2022-08-14 DIAGNOSIS — Z8673 Personal history of transient ischemic attack (TIA), and cerebral infarction without residual deficits: Secondary | ICD-10-CM | POA: Diagnosis not present

## 2022-08-14 DIAGNOSIS — M1990 Unspecified osteoarthritis, unspecified site: Secondary | ICD-10-CM | POA: Diagnosis not present

## 2022-08-14 DIAGNOSIS — Z86711 Personal history of pulmonary embolism: Secondary | ICD-10-CM | POA: Diagnosis not present

## 2022-08-14 DIAGNOSIS — R9431 Abnormal electrocardiogram [ECG] [EKG]: Secondary | ICD-10-CM | POA: Diagnosis not present

## 2022-08-14 DIAGNOSIS — F419 Anxiety disorder, unspecified: Secondary | ICD-10-CM | POA: Diagnosis not present

## 2022-08-15 DIAGNOSIS — N179 Acute kidney failure, unspecified: Secondary | ICD-10-CM | POA: Diagnosis not present

## 2022-08-16 DIAGNOSIS — I959 Hypotension, unspecified: Secondary | ICD-10-CM | POA: Diagnosis not present

## 2022-08-17 ENCOUNTER — Ambulatory Visit (INDEPENDENT_AMBULATORY_CARE_PROVIDER_SITE_OTHER): Payer: Medicare Other

## 2022-08-17 ENCOUNTER — Ambulatory Visit: Payer: Medicare Other | Attending: Cardiology | Admitting: Cardiology

## 2022-08-17 ENCOUNTER — Encounter: Payer: Self-pay | Admitting: Cardiology

## 2022-08-17 VITALS — BP 132/82 | HR 66 | Ht 63.0 in | Wt 141.0 lb

## 2022-08-17 DIAGNOSIS — I4891 Unspecified atrial fibrillation: Secondary | ICD-10-CM | POA: Diagnosis not present

## 2022-08-17 DIAGNOSIS — I10 Essential (primary) hypertension: Secondary | ICD-10-CM

## 2022-08-17 DIAGNOSIS — R002 Palpitations: Secondary | ICD-10-CM

## 2022-08-17 DIAGNOSIS — E1169 Type 2 diabetes mellitus with other specified complication: Secondary | ICD-10-CM

## 2022-08-17 DIAGNOSIS — E785 Hyperlipidemia, unspecified: Secondary | ICD-10-CM

## 2022-08-17 DIAGNOSIS — D6859 Other primary thrombophilia: Secondary | ICD-10-CM

## 2022-08-17 DIAGNOSIS — Z794 Long term (current) use of insulin: Secondary | ICD-10-CM

## 2022-08-17 NOTE — Patient Instructions (Signed)
Medication Instructions:  Your physician recommends that you continue on your current medications as directed. Please refer to the Current Medication list given to you today.  *If you need a refill on your cardiac medications before your next appointment, please call your pharmacy*   Lab Work: NONE If you have labs (blood work) drawn today and your tests are completely normal, you will receive your results only by: MyChart Message (if you have MyChart) OR A paper copy in the mail If you have any lab test that is abnormal or we need to change your treatment, we will call you to review the results.   Testing/Procedures: You have been asked to wear a Zio Heart Monitor today. It is to be worn for 14 days. Please remove the monitor on 8/8 and mail back in the box provided.  If you have any questions about the monitor please call the company at [phone]     Follow-Up: At [facility], you and your health needs are our priority.  As part of our continuing mission to provide you with exceptional heart care, we have created designated Provider Care Teams.  These Care Teams include your primary Cardiologist (physician) and Advanced Practice Providers (APPs -  Physician Assistants and Nurse Practitioners) who all work together to provide you with the care you need, when you need it.  We recommend signing up for the patient portal called "MyChart".  Sign up information is provided on this After Visit Summary.  MyChart is used to connect with patients for Virtual Visits (Telemedicine).  Patients are able to view lab/test results, encounter notes, upcoming appointments, etc.  Non-urgent messages can be sent to your provider as well.   To learn more about what you can do with MyChart, go to [email or web address].    Your next appointment:   6 month(s)  Provider:   Belva Crome, MD    Other Instructions

## 2022-08-17 NOTE — Progress Notes (Signed)
Cardiology Office Note:  .   Date:  08/17/2022  ID:  Jennifer Lucero, DOB Aug 04, 1950, MRN [id number] PCP: Krystal Clark, NP  [facility] Providers Cardiologist:  Garwin Brothers, MD    History of Present Illness: [name] Kitchen   Jennifer Lucero is a 72 y.o. female with a past medical history of pulmonary embolism, stroke, hypertension, atrial fibrillation, sick sinus syndrome, GERD, DM 2, CKD stage III, hyperlipidemia.  07/29/2022 echocardiogram EF 60 to 65%, grade 1 DD, trivial MR 11/01/2018 left heart cath revealed normal coronary arteries, no calcification noted  Most recently evaluated by Dr. Tomie China on 01/25/2022, also well from a cardiac perspective, advised to follow-up in 6-month sooner if needed.  Since she was last evaluated in our office she has had multiple admissions to the hospital as well as ED visits.  She suffered a fall and sustained a hip fracture and has been residing at [facility] rehab since March. 07/22/2022 admitted to [facility] with atrial fibrillation RVR. 08/14/2022 evaluated [facility] health ED for chest pain, troponins negative x 2, no acute EKG changes.  She presents today accompanied by a staff member from the rehab facility that she is living at for follow-up after her recent ED visits.  She expresses that she has had some episodes of fleeting sharp chest pain, or not anginal in nature.  She also expresses that she feels like her heart is racing at times. She denies chest pain, palpitations, dyspnea, pnd, orthopnea, n, v, dizziness, syncope, edema, weight gain, or early satiety.    ROS: Review of Systems  Constitutional: Negative.   HENT: Negative.    Eyes: Negative.   Respiratory: Negative.    Cardiovascular:  Positive for palpitations.  Gastrointestinal: Negative.   Genitourinary: Negative.   Musculoskeletal: Negative.   Skin: Negative.   Neurological: Negative.   Endo/Heme/Allergies: Negative.   Psychiatric/Behavioral: Negative.        Studies Reviewed: [name] Kitchen   EKG Interpretation Date/Time:  Thursday August 17 2022 15:35:16 EDT Ventricular Rate:  66 PR Interval:  138 QRS Duration:  124 QT Interval:  434 QTC Calculation: 454 R Axis:   -78  Text Interpretation: Sinus rhythm with marked sinus arrhythmia Left axis deviation Right bundle branch block Abnormal ECG When compared with ECG of 31-Jul-2020 05:57, Premature atrial complexes are no longer Present Non-specific change in ST segment in Anterior leads T wave inversion less evident in Inferior leads Confirmed by Wallis Bamberg [phone] on 08/17/2022 5:23:12 PM    Cardiac Studies & Procedures   CARDIAC CATHETERIZATION  CARDIAC CATHETERIZATION 11/01/2018  Narrative  Normal coronary arteries.  Normal left ventricle with EF 55%.  LVEDP is normal.  No coronary calcification noted on cine fluoroscopy.  RECOMMENDATIONS:   Further management per Dr. Tomie China  Findings Coronary Findings Diagnostic  Dominance: Right  No diagnostic findings have been documented. Intervention  No interventions have been documented.   STRESS TESTS  MYOCARDIAL PERFUSION IMAGING 11/28/2017  Narrative  Nuclear stress EF: 64%.  The left ventricular ejection fraction is normal (55-65%).  There was no ST segment deviation noted during stress.  The study is normal.  This is a low risk study.   ECHOCARDIOGRAM  ECHOCARDIOGRAM COMPLETE 07/29/2020  Narrative ECHOCARDIOGRAM REPORT    Patient Name:   Jennifer Lucero Date of Exam: 07/29/2020 Medical Rec #:  [id number]       Height:       64.0 in Accession #:    [id number]      Weight:  216.0 lb Date of Birth:  06-13-1950       BSA:          2.022 m Patient Age:    72 years        BP:           137/125 mmHg Patient Gender: F               HR:           103 bpm. Exam Location:  Inpatient  Procedure: 2D Echo, Color Doppler and Cardiac Doppler  Indications:    Stroke  History:        Patient has no prior history of  Echocardiogram examinations. Risk Factors:Morbid obesity and Hypertension. Stroke 434.91/I163.9.  Sonographer:    Roosvelt Maser RDCS Referring Phys: [phone] MCNEILL P KIRKPATRICK  IMPRESSIONS   1. Left ventricular ejection fraction, by estimation, is 55 to 60%. The left ventricle has normal function. The left ventricle has no regional wall motion abnormalities. Left ventricular diastolic parameters are consistent with Grade I diastolic dysfunction (impaired relaxation). 2. Right ventricular systolic function is normal. The right ventricular size is normal. Tricuspid regurgitation signal is inadequate for assessing PA pressure. 3. Left atrial size was mildly dilated. 4. Right atrial size was mildly dilated. 5. The mitral valve is normal in structure. No evidence of mitral valve regurgitation. No evidence of mitral stenosis. 6. The aortic valve is tricuspid. Aortic valve regurgitation is not visualized. Mild aortic valve sclerosis is present, with no evidence of aortic valve stenosis. 7. The inferior vena cava is normal in size with greater than 50% respiratory variability, suggesting right atrial pressure of 3 mmHg.  FINDINGS Left Ventricle: Left ventricular ejection fraction, by estimation, is 55 to 60%. The left ventricle has normal function. The left ventricle has no regional wall motion abnormalities. The left ventricular internal cavity size was normal in size. There is no left ventricular hypertrophy. Left ventricular diastolic parameters are consistent with Grade I diastolic dysfunction (impaired relaxation).  Right Ventricle: The right ventricular size is normal. No increase in right ventricular wall thickness. Right ventricular systolic function is normal. Tricuspid regurgitation signal is inadequate for assessing PA pressure.  Left Atrium: Left atrial size was mildly dilated.  Right Atrium: Right atrial size was mildly dilated.  Pericardium: There is no evidence of pericardial  effusion.  Mitral Valve: The mitral valve is normal in structure. There is mild thickening of the mitral valve leaflet(s). No evidence of mitral valve regurgitation. No evidence of mitral valve stenosis.  Tricuspid Valve: The tricuspid valve is normal in structure. Tricuspid valve regurgitation is not demonstrated.  Aortic Valve: The aortic valve is tricuspid. Aortic valve regurgitation is not visualized. Mild aortic valve sclerosis is present, with no evidence of aortic valve stenosis. Aortic valve mean gradient measures 9.0 mmHg. Aortic valve peak gradient measures 16.8 mmHg. Aortic valve area, by VTI measures 1.47 cm.  Pulmonic Valve: The pulmonic valve was normal in structure. Pulmonic valve regurgitation is trivial.  Aorta: The aortic root is normal in size and structure.  Venous: The inferior vena cava is normal in size with greater than 50% respiratory variability, suggesting right atrial pressure of 3 mmHg.  IAS/Shunts: No atrial level shunt detected by color flow Doppler.   LEFT VENTRICLE PLAX 2D LVIDd:         4.10 cm  Diastology LVIDs:         3.00 cm  LV e' medial:    9.68 cm/s LV PW:  0.90 cm  LV E/e' medial:  9.9 LV IVS:        1.00 cm  LV e' lateral:   16.80 cm/s LVOT diam:     1.90 cm  LV E/e' lateral: 5.7 LV SV:         50 LV SV Index:   25 LVOT Area:     2.84 cm   RIGHT VENTRICLE RV Basal diam:  4.00 cm RV Mid diam:    3.50 cm  LEFT ATRIUM             Index       RIGHT ATRIUM           Index LA diam:        3.20 cm 1.58 cm/m  RA Area:     19.40 cm LA Vol (A2C):   70.1 ml 34.68 ml/m RA Volume:   58.60 ml  28.99 ml/m LA Vol (A4C):   81.6 ml 40.36 ml/m LA Biplane Vol: 78.4 ml 38.78 ml/m AORTIC VALVE AV Area (Vmax):    1.42 cm AV Area (Vmean):   1.43 cm AV Area (VTI):     1.47 cm AV Vmax:           205.00 cm/s AV Vmean:          141.000 cm/s AV VTI:            0.342 m AV Peak Grad:      16.8 mmHg AV Mean Grad:      9.0 mmHg LVOT Vmax:          103.00 cm/s LVOT Vmean:        71.300 cm/s LVOT VTI:          0.177 m LVOT/AV VTI ratio: 0.52  AORTA Ao Root diam: 3.00 cm Ao Asc diam:  3.30 cm  MITRAL VALVE MV Area (PHT): 4.29 cm     SHUNTS MV Decel Time: 177 msec     Systemic VTI:  0.18 m MV E velocity: 95.90 cm/s   Systemic Diam: 1.90 cm MV A velocity: 136.00 cm/s MV E/A ratio:  0.71  Marca Ancona MD Electronically signed by Marca Ancona MD Signature Date/Time: 07/29/2020/4:23:49 PM    Final             Risk Assessment/Calculations:    CHA2DS2-VASc Score = 6   This indicates a 9.7% annual risk of stroke. The patient's score is based upon: CHF History: 0 HTN History: 1 Diabetes History: 1 Stroke History: 2 Vascular Disease History: 0 Age Score: 1 Gender Score: 1            Physical Exam:   VS:  BP 132/82 (BP Location: Right Arm, Patient Position: Sitting, Cuff Size: Normal)   Pulse 66   Ht 5\' 3"  (1.6 m)   Wt 141 lb (64 kg)   SpO2 92%   BMI 24.98 kg/m    Wt Readings from Last 3 Encounters:  08/17/22 141 lb (64 kg)  01/25/22 142 lb 6.4 oz (64.6 kg)  05/25/21 160 lb 12.8 oz (72.9 kg)    GEN: ill-appearing, non-verbal, no acute distress NECK: No JVD; No carotid bruits CARDIAC: RRR, no murmurs, rubs, gallops RESPIRATORY:  Clear to auscultation without rales, wheezing or rhonchi  ABDOMEN: Soft, non-tender, non-distended EXTREMITIES:  No edema; No deformity   ASSESSMENT AND PLAN: .   Paroxysmal atrial fibrillation/hypercoagulable state-CHA2DS2-VASc of 6, she is in rhythm today,Continue Eliquis 5 mg twice daily--no indication for dose reduction, continue metoprolol  25 mg twice daily.  Recent H&H were stable on 08/14/2022, no signs of hematochezia, hematuria, hemoptysis.  Palpitations-will arrange for 2-week monitor, labs have been unrevealing for causes.    CVA-continue Eliquis 5 mg twice daily, continue atorvastatin 10 mg daily.  HTN - BP is controlled at 132/82, continue metoprolol 25 mg twice  daily.     Dispo: 2-week monitor. F/U 6  months with Dr. Tomie China.   Signed, Flossie Dibble, NP

## 2022-09-09 IMAGING — CT CT HEAD W/O CM
4 series · 16 of 47 positions shown, 18 images · non-contrast
Comparison: Yesterday

CLINICAL DATA: Nontraumatic seizure

EXAM:
CT HEAD WITHOUT CONTRAST
TECHNIQUE: Contiguous axial images were obtained from the base of the skull
through the vertex without intravenous contrast.

[Series 2: head wo · axial · 0.43mm/px · z∈[+1076,+1201]mm · 7 of 35 slices shown, 9 images]
[im 5/35  brain]
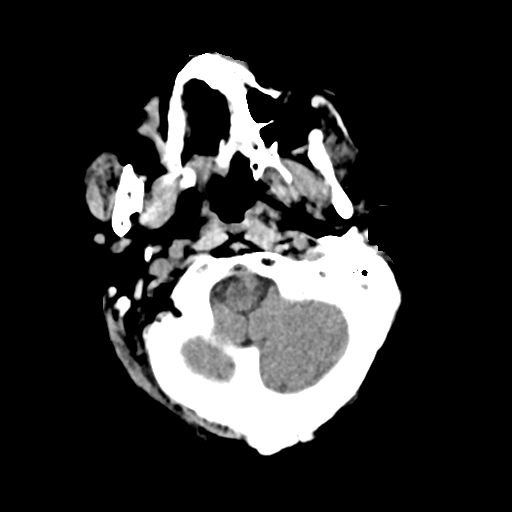
[im 5/35  bone]
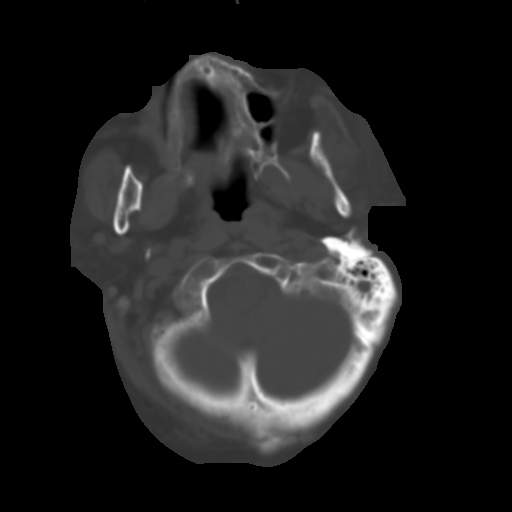
[im 9/35  brain]
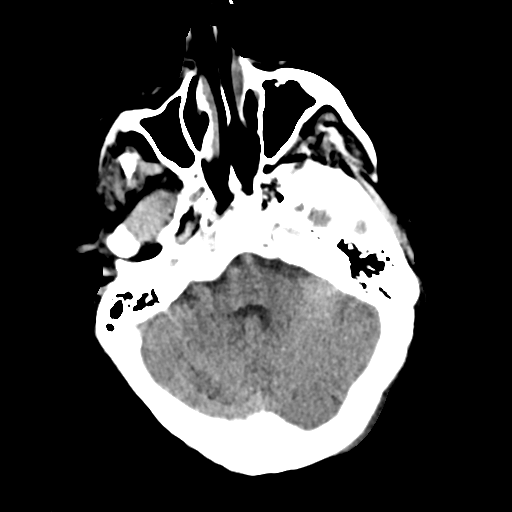
[im 13/35  brain]
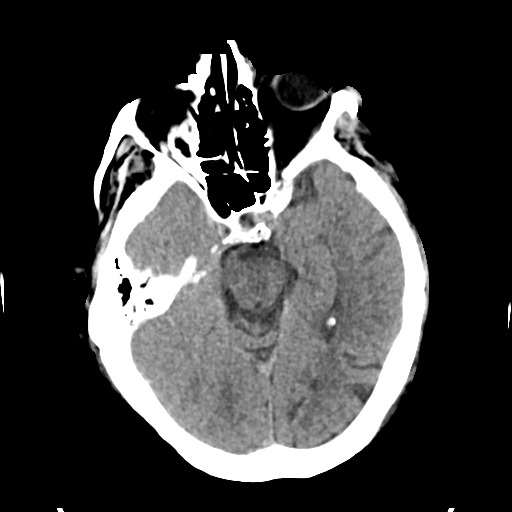
[im 18/35  brain]
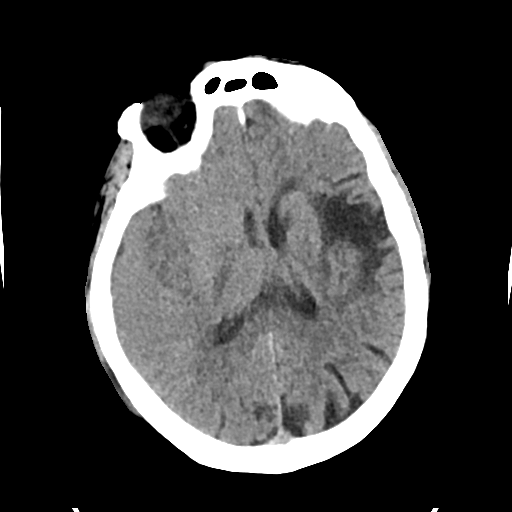
[im 22/35  brain]
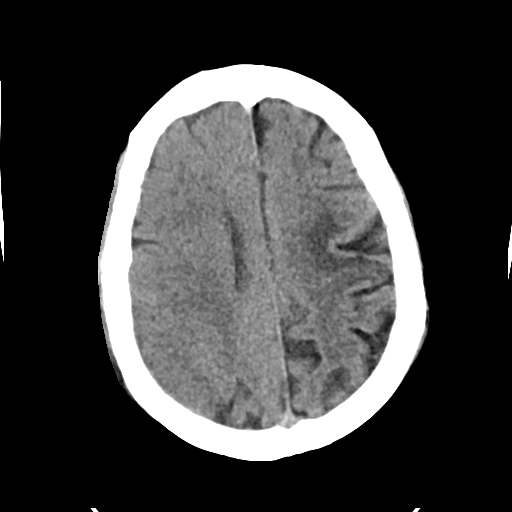
[im 22/35  bone]
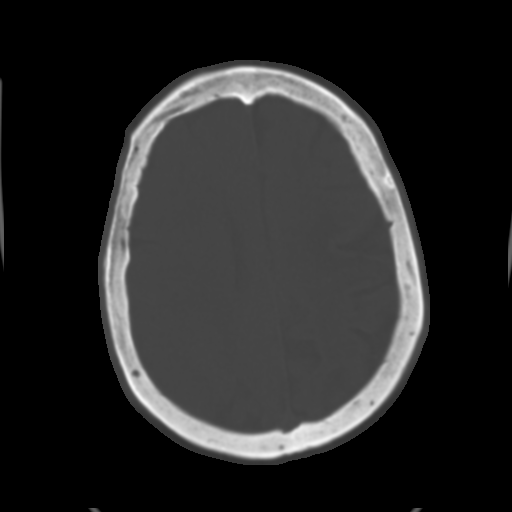
[im 26/35  brain]
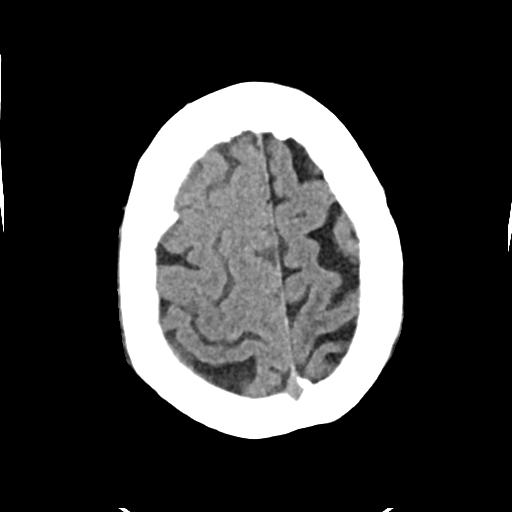
[im 30/35  brain]
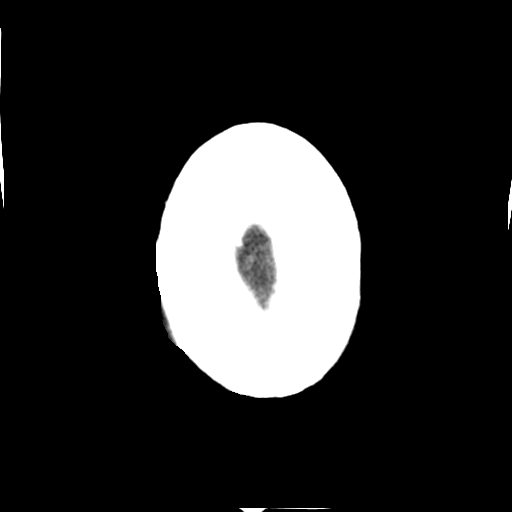

[Series 3: head bone · axial · 0.43mm/px · z∈[+1072,+1106]mm · 3 of 86 slices shown]
[im 9/86  bone]
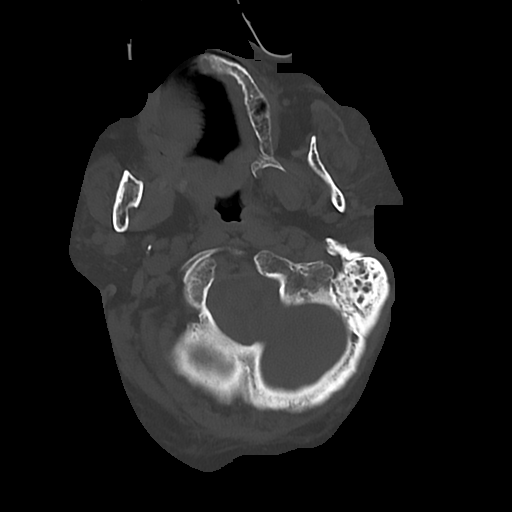
[im 18/86  bone]
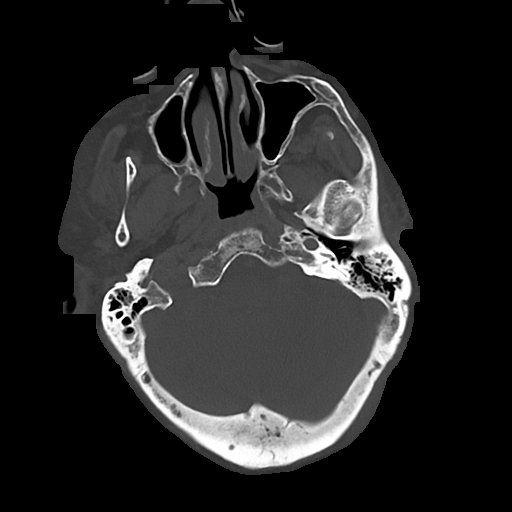
[im 26/86  bone]
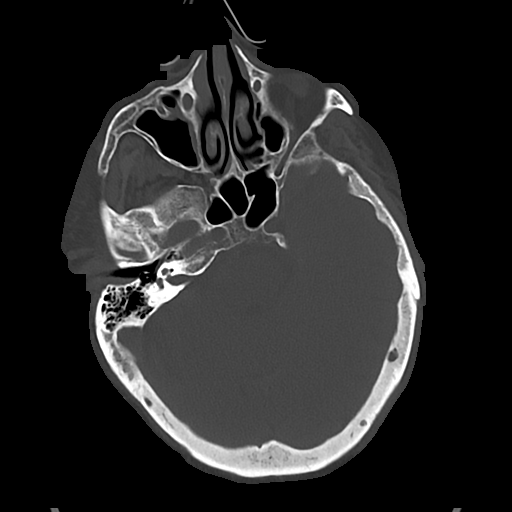

[Series 4: cor soft · coronal · 0.33mm/px · 3 of 76 slices shown]
[im 26/76  brain]
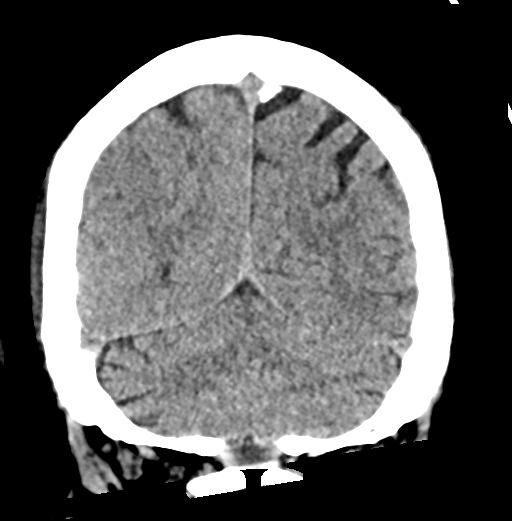
[im 34/76  brain]
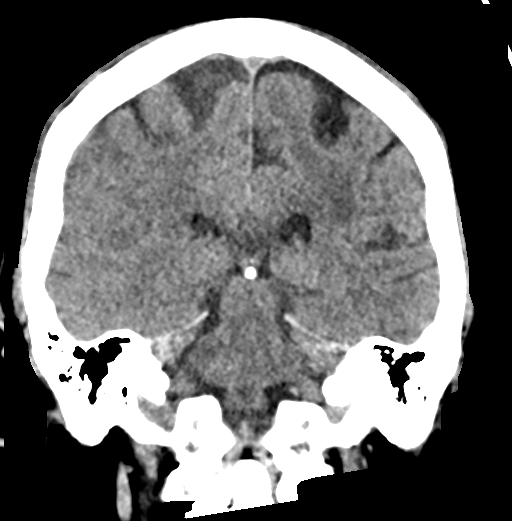
[im 42/76  brain]
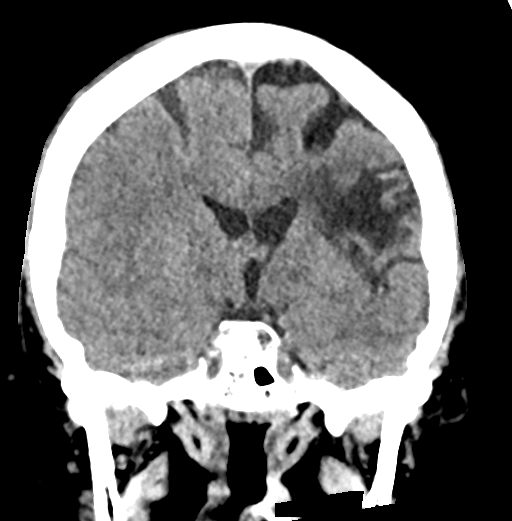

[Series 5: sag soft · sagittal · 0.33mm/px · 3 of 56 slices shown]
[im 21/56  brain]
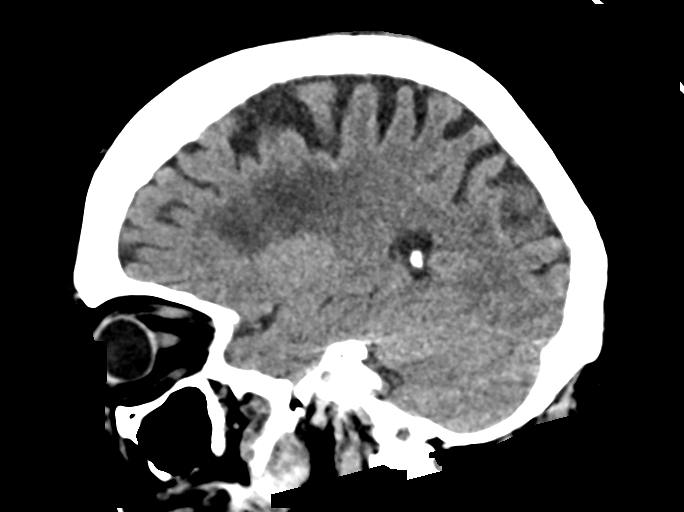
[im 28/56  brain]
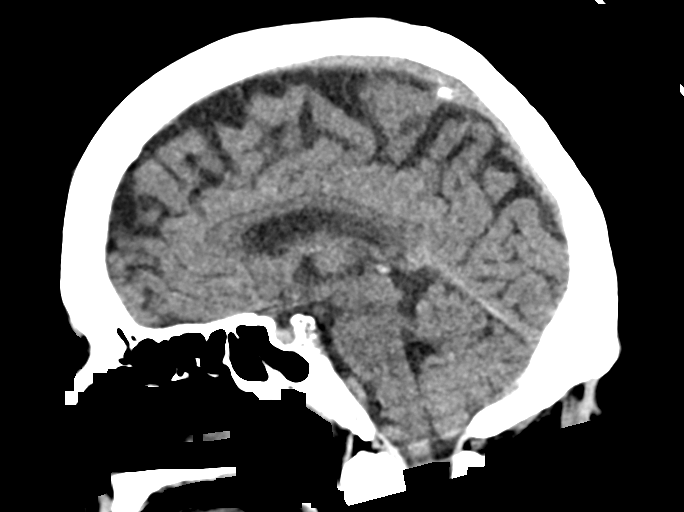
[im 35/56  brain]
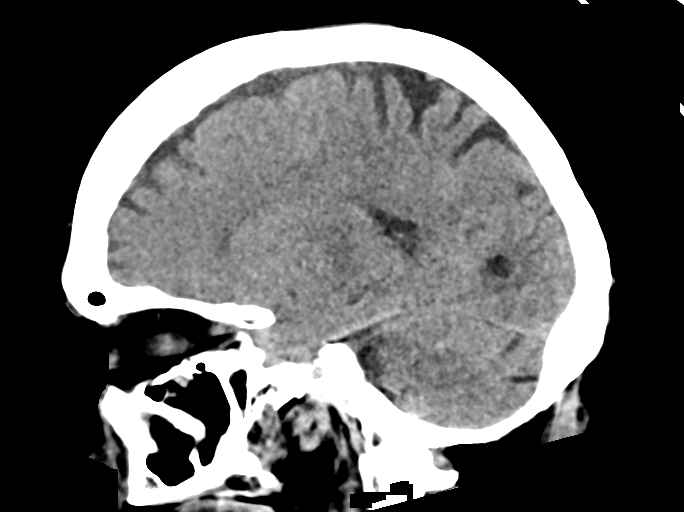

[16 of 47 positions shown; findings below may reference images not displayed]

FINDINGS: Brain: Cytotoxic edema seen at the right insula and adjacent frontal
white matter. Although not hyperdense there is definite asymmetric
increased density of sulci at the right vertex when compared to the
left. Remote left MCA territory infarction with encephalomalacia
involving the cortex at the insula and adjacent frontal lobe.
Asymmetric atrophy of the left cerebral hemisphere.

Vascular: No hyperdense vessel or unexpected calcification.

Skull: Normal. Negative for fracture or focal lesion.

Sinuses/Orbits: No acute finding.

Other:
IMPRESSION: 1. Right insular and peri-insular acute infarct.
2. Increased subarachnoid density at the right vertex, presumably
diluted subarachnoid blood.

## 2022-09-22 ENCOUNTER — Telehealth: Payer: Self-pay

## 2022-09-22 NOTE — Telephone Encounter (Signed)
Called but no answer and "full memory"

## 2022-09-22 NOTE — Telephone Encounter (Signed)
-----   Message from Jennifer Lucero sent at 09/22/2022 12:38 PM EDT ----- Monitor revealed she is mostly in sinus rhythm, occasional extra beats, but nothing we need to treat.  Stable results.

## 2022-09-22 NOTE — Telephone Encounter (Signed)
Spoke with spouse per DPR- per Jimmy Footman note that monitor results were stable. Spouse verbalized understanding and had no further questions.

## 2023-02-12 NOTE — Progress Notes (Unsigned)
Cardiology Office Note:  .   Date:  02/13/2023  ID:  MILDRED STOHLER, DOB 03/19/1950, MRN [id number] PCP: Krystal Clark, NP  [facility] Providers Cardiologist:  Garwin Brothers, MD    History of Present Illness: [name] Kitchen   GWENDA KISIEL is a 73 y.o. female with a past medical history of pulmonary embolism, stroke, hypertension, atrial fibrillation on Eliquis, sick sinus syndrome, GERD, DM 2, CKD stage III, hyperlipidemia.  08/17/2022 monitor average heart rate 67 bpm, predominant rhythm was sinus, BBB/IVCD was present 07/29/2022 echocardiogram EF 60 to 65%, grade 1 DD, trivial MR 11/01/2018 left heart cath revealed normal coronary arteries, no calcification noted  Evaluated by Dr. Tomie China on 01/25/2022,  well from a cardiac perspective, advised to follow-up in 45-month sooner if needed.  Multiple admissions to the hospital as well as ED visits.  She suffered a fall and sustained a hip fracture and has been residing at [facility] rehab since March. 07/22/2022 admitted to [facility] with atrial fibrillation RVR. 08/14/2022 evaluated [facility] health ED for chest pain, troponins negative x 2, no acute EKG changes.  Evaluated 08/17/2022 following recent hospital evaluations as outlined above, she was still residing in a rehab facility, bothered by her heart racing at times, we repeated a monitor which was overall unrevealing.  She presents today accompanied by staff member from her assisted living facility.  She is nonverbal, pleasant and smiling.  She shakes her head indicating that nothing is bothering her.  She is mostly wheelchair-bound however can stand on occasion with assistance.  She does have a PEG tube for feeding.  Staff member is not aware of any issues going on with her.  When I discuss episodes of bleeding since she is on Eliquis, she nods her head indicating that she has had bleeding from her vaginal area. She denies chest pain, palpitations, dyspnea, pnd, orthopnea, n, v,  dizziness, syncope, edema, weight gain, or early satiety.    ROS: Review of Systems  Constitutional: Negative.   HENT: Negative.  Negative for sore throat.   Eyes: Negative.   Respiratory: Negative.    Cardiovascular: Negative.   Gastrointestinal: Negative.   Genitourinary:  Positive for hematuria.  Musculoskeletal: Negative.   Skin: Negative.   Neurological: Negative.   Endo/Heme/Allergies: Negative.   Psychiatric/Behavioral: Negative.       Studies Reviewed: .        Cardiac Studies & Procedures   CARDIAC CATHETERIZATION  CARDIAC CATHETERIZATION 11/01/2018  Narrative  Normal coronary arteries.  Normal left ventricle with EF 55%.  LVEDP is normal.  No coronary calcification noted on cine fluoroscopy.  RECOMMENDATIONS:   Further management per Dr. Tomie China  Findings Coronary Findings Diagnostic  Dominance: Right  No diagnostic findings have been documented. Intervention  No interventions have been documented.   STRESS TESTS  MYOCARDIAL PERFUSION IMAGING 11/28/2017  Narrative  Nuclear stress EF: 64%.  The left ventricular ejection fraction is normal (55-65%).  There was no ST segment deviation noted during stress.  The study is normal.  This is a low risk study.  ECHOCARDIOGRAM  ECHOCARDIOGRAM COMPLETE 07/29/2020  Narrative ECHOCARDIOGRAM REPORT    Patient Name:   KAZANDRA INFIELD Date of Exam: 07/29/2020 Medical Rec #:  [id number]       Height:       64.0 in Accession #:    [id number]      Weight:       216.0 lb Date of Birth:  [birth year]  BSA:          2.022 m Patient Age:    70 years        BP:           137/125 mmHg Patient Gender: F               HR:           103 bpm. Exam Location:  Inpatient  Procedure: 2D Echo, Color Doppler and Cardiac Doppler  Indications:    Stroke  History:        Patient has no prior history of Echocardiogram examinations. Risk Factors:Morbid obesity and Hypertension. Stroke 434.91/I163.9.  Sonographer:     Roosvelt Maser RDCS Referring Phys: [phone] MCNEILL P KIRKPATRICK  IMPRESSIONS   1. Left ventricular ejection fraction, by estimation, is 55 to 60%. The left ventricle has normal function. The left ventricle has no regional wall motion abnormalities. Left ventricular diastolic parameters are consistent with Grade I diastolic dysfunction (impaired relaxation). 2. Right ventricular systolic function is normal. The right ventricular size is normal. Tricuspid regurgitation signal is inadequate for assessing PA pressure. 3. Left atrial size was mildly dilated. 4. Right atrial size was mildly dilated. 5. The mitral valve is normal in structure. No evidence of mitral valve regurgitation. No evidence of mitral stenosis. 6. The aortic valve is tricuspid. Aortic valve regurgitation is not visualized. Mild aortic valve sclerosis is present, with no evidence of aortic valve stenosis. 7. The inferior vena cava is normal in size with greater than 50% respiratory variability, suggesting right atrial pressure of 3 mmHg.  FINDINGS Left Ventricle: Left ventricular ejection fraction, by estimation, is 55 to 60%. The left ventricle has normal function. The left ventricle has no regional wall motion abnormalities. The left ventricular internal cavity size was normal in size. There is no left ventricular hypertrophy. Left ventricular diastolic parameters are consistent with Grade I diastolic dysfunction (impaired relaxation).  Right Ventricle: The right ventricular size is normal. No increase in right ventricular wall thickness. Right ventricular systolic function is normal. Tricuspid regurgitation signal is inadequate for assessing PA pressure.  Left Atrium: Left atrial size was mildly dilated.  Right Atrium: Right atrial size was mildly dilated.  Pericardium: There is no evidence of pericardial effusion.  Mitral Valve: The mitral valve is normal in structure. There is mild thickening of the mitral valve  leaflet(s). No evidence of mitral valve regurgitation. No evidence of mitral valve stenosis.  Tricuspid Valve: The tricuspid valve is normal in structure. Tricuspid valve regurgitation is not demonstrated.  Aortic Valve: The aortic valve is tricuspid. Aortic valve regurgitation is not visualized. Mild aortic valve sclerosis is present, with no evidence of aortic valve stenosis. Aortic valve mean gradient measures 9.0 mmHg. Aortic valve peak gradient measures 16.8 mmHg. Aortic valve area, by VTI measures 1.47 cm.  Pulmonic Valve: The pulmonic valve was normal in structure. Pulmonic valve regurgitation is trivial.  Aorta: The aortic root is normal in size and structure.  Venous: The inferior vena cava is normal in size with greater than 50% respiratory variability, suggesting right atrial pressure of 3 mmHg.  IAS/Shunts: No atrial level shunt detected by color flow Doppler.   LEFT VENTRICLE PLAX 2D LVIDd:         4.10 cm  Diastology LVIDs:         3.00 cm  LV e' medial:    9.68 cm/s LV PW:         0.90 cm  LV E/e' medial:  9.9 LV IVS:        1.00 cm  LV e' lateral:   16.80 cm/s LVOT diam:     1.90 cm  LV E/e' lateral: 5.7 LV SV:         50 LV SV Index:   25 LVOT Area:     2.84 cm   RIGHT VENTRICLE RV Basal diam:  4.00 cm RV Mid diam:    3.50 cm  LEFT ATRIUM             Index       RIGHT ATRIUM           Index LA diam:        3.20 cm 1.58 cm/m  RA Area:     19.40 cm LA Vol (A2C):   70.1 ml 34.68 ml/m RA Volume:   58.60 ml  28.99 ml/m LA Vol (A4C):   81.6 ml 40.36 ml/m LA Biplane Vol: 78.4 ml 38.78 ml/m AORTIC VALVE AV Area (Vmax):    1.42 cm AV Area (Vmean):   1.43 cm AV Area (VTI):     1.47 cm AV Vmax:           205.00 cm/s AV Vmean:          141.000 cm/s AV VTI:            0.342 m AV Peak Grad:      16.8 mmHg AV Mean Grad:      9.0 mmHg LVOT Vmax:         103.00 cm/s LVOT Vmean:        71.300 cm/s LVOT VTI:          0.177 m LVOT/AV VTI ratio:  0.52  AORTA Ao Root diam: 3.00 cm Ao Asc diam:  3.30 cm  MITRAL VALVE MV Area (PHT): 4.29 cm     SHUNTS MV Decel Time: 177 msec     Systemic VTI:  0.18 m MV E velocity: 95.90 cm/s   Systemic Diam: 1.90 cm MV A velocity: 136.00 cm/s MV E/A ratio:  0.71  Marca Ancona MD Electronically signed by Marca Ancona MD Signature Date/Time: 07/29/2020/4:23:49 PM    Final   MONITORS  LONG TERM MONITOR (3-14 DAYS) 09/19/2022  Narrative Patch Wear Time:  13 days and 23 hours (2024-07-25T16:07:05-398 to 2024-08-08T16:06:57-0400)  Patient had a min HR of 42 bpm, max HR of 159 bpm, and avg HR of 67 bpm.  Predominant underlying rhythm was Sinus Rhythm. Bundle Branch Block/IVCD was present. Isolated SVEs were rare (<1.0%), SVE Couplets were rare (<1.0%), and SVE Triplets were rare (<1.0%).  Isolated VEs were rare (<1.0%), and no VE Couplets or VE Triplets were present. Ventricular Trigeminy was present.           Risk Assessment/Calculations:    CHA2DS2-VASc Score = 6   This indicates a 9.7% annual risk of stroke. The patient's score is based upon: CHF History: 0 HTN History: 1 Diabetes History: 1 Stroke History: 2 Vascular Disease History: 0 Age Score: 1 Gender Score: 1            Physical Exam:   VS:  BP 110/60 (BP Location: Right Arm, Patient Position: Sitting, Cuff Size: Normal)   Pulse 61   Ht 5\' 3"  (1.6 m)   Wt 137 lb (62.1 kg)   SpO2 95%   BMI 24.27 kg/m    Wt Readings from Last 3 Encounters:  02/13/23 137 lb (62.1 kg)  08/17/22 141 lb (64 kg)  01/25/22 142 lb 6.4 oz (64.6 kg)    GEN: ill-appearing, non-verbal, no acute distress NECK: No JVD; No carotid bruits CARDIAC: RRR, no murmurs, rubs, gallops RESPIRATORY:  Clear to auscultation without rales, wheezing or rhonchi  ABDOMEN: Soft, non-tender, non-distended EXTREMITIES:  No edema; No deformity   ASSESSMENT AND PLAN: .   Paroxysmal atrial fibrillation/hypercoagulable state-CHA2DS2-VASc of 6, her heart rate  is controlled, we did not repeat an EKG today but it sounds as though she is in rhythm.  Will repeat CBC BMET to make sure she is on the appropriate dose of Eliquis.  She indicates that she is having vaginal bleeding and or hematuria, she is not able to decipher which one--staff member looks surprised that she is indicating yes.  Again repeating labs.  Palpitations-quiescent, not currently bothered by this.  CVA-currently on reduced dose Eliquis, she is wheelchair-bound, aphasic.  HTN - BP is controlled at 110/60 continue metoprolol 25 mg twice daily.     Dispo: CBC, BMET, F/U 6  months with Dr. Tomie China.   Signed, Flossie Dibble, NP

## 2023-02-13 ENCOUNTER — Ambulatory Visit: Payer: Medicare Other | Attending: Cardiology | Admitting: Cardiology

## 2023-02-13 ENCOUNTER — Encounter: Payer: Self-pay | Admitting: Cardiology

## 2023-02-13 VITALS — BP 110/60 | HR 61 | Ht 63.0 in | Wt 137.0 lb

## 2023-02-13 DIAGNOSIS — R002 Palpitations: Secondary | ICD-10-CM

## 2023-02-13 DIAGNOSIS — Z8673 Personal history of transient ischemic attack (TIA), and cerebral infarction without residual deficits: Secondary | ICD-10-CM

## 2023-02-13 DIAGNOSIS — I4891 Unspecified atrial fibrillation: Secondary | ICD-10-CM | POA: Diagnosis present

## 2023-02-13 DIAGNOSIS — I10 Essential (primary) hypertension: Secondary | ICD-10-CM | POA: Diagnosis present

## 2023-02-13 DIAGNOSIS — D6859 Other primary thrombophilia: Secondary | ICD-10-CM

## 2023-02-13 NOTE — Patient Instructions (Addendum)
Medication Instructions:  Your physician recommends that you continue on your current medications as directed. Please refer to the Current Medication list given to you today.  *If you need a refill on your cardiac medications before your next appointment, please call your pharmacy*   Lab Work: Your physician recommends that you return for lab work in: Today for a CBC and BMP  If you have labs (blood work) drawn today and your tests are completely normal, you will receive your results only by: MyChart Message (if you have MyChart) OR A paper copy in the mail If you have any lab test that is abnormal or we need to change your treatment, we will call you to review the results.   Testing/Procedures: None   Follow-Up: At [facility], you and your health needs are our priority.  As part of our continuing mission to provide you with exceptional heart care, we have created designated Provider Care Teams.  These Care Teams include your primary Cardiologist (physician) and Advanced Practice Providers (APPs -  Physician Assistants and Nurse Practitioners) who all work together to provide you with the care you need, when you need it.  We recommend signing up for the patient portal called "MyChart".  Sign up information is provided on this After Visit Summary.  MyChart is used to connect with patients for Virtual Visits (Telemedicine).  Patients are able to view lab/test results, encounter notes, upcoming appointments, etc.  Non-urgent messages can be sent to your provider as well.   To learn more about what you can do with MyChart, go to [email or web address].    Your next appointment:   6 month(s)  Provider:   Belva Crome, MD    Other Instructions

## 2023-02-14 LAB — CBC WITH DIFFERENTIAL/PLATELET
Basophils Absolute: 0 x10E3/uL (ref 0.0–0.2)
Basos: 1 %
EOS (ABSOLUTE): 0.2 x10E3/uL (ref 0.0–0.4)
Eos: 4 %
Hematocrit: 37.8 % (ref 34.0–46.6)
Hemoglobin: 12.5 g/dL (ref 11.1–15.9)
Immature Grans (Abs): 0 x10E3/uL (ref 0.0–0.1)
Immature Granulocytes: 0 %
Lymphocytes Absolute: 2.2 x10E3/uL (ref 0.7–3.1)
Lymphs: 45 %
MCH: 33.9 pg — ABNORMAL HIGH (ref 26.6–33.0)
MCHC: 33.1 g/dL (ref 31.5–35.7)
MCV: 102 fL — ABNORMAL HIGH (ref 79–97)
Monocytes Absolute: 0.5 x10E3/uL (ref 0.1–0.9)
Monocytes: 10 %
Neutrophils Absolute: 1.9 x10E3/uL (ref 1.4–7.0)
Neutrophils: 40 %
Platelets: 261 x10E3/uL (ref 150–450)
RBC: 3.69 x10E6/uL — ABNORMAL LOW (ref 3.77–5.28)
RDW: 11.8 % (ref 11.7–15.4)
WBC: 4.8 x10E3/uL (ref 3.4–10.8)

## 2023-02-14 LAB — BASIC METABOLIC PANEL WITH GFR
BUN/Creatinine Ratio: 39 — ABNORMAL HIGH (ref 12–28)
BUN: 34 mg/dL — ABNORMAL HIGH (ref 8–27)
CO2: 25 mmol/L (ref 20–29)
Calcium: 9.6 mg/dL (ref 8.7–10.3)
Chloride: 106 mmol/L (ref 96–106)
Creatinine, Ser: 0.87 mg/dL (ref 0.57–1.00)
Glucose: 112 mg/dL — ABNORMAL HIGH (ref 70–99)
Potassium: 4.8 mmol/L (ref 3.5–5.2)
Sodium: 143 mmol/L (ref 134–144)
eGFR: 70 mL/min/1.73

## 2023-02-22 ENCOUNTER — Encounter: Payer: Self-pay | Admitting: Emergency Medicine

## 2023-07-07 LAB — COMPREHENSIVE METABOLIC PANEL WITH GFR: EGFR: 77

## 2023-08-15 ENCOUNTER — Encounter: Payer: Self-pay | Admitting: Cardiology

## 2023-08-15 ENCOUNTER — Ambulatory Visit: Attending: Cardiology | Admitting: Cardiology

## 2023-08-15 VITALS — BP 92/54 | HR 53 | Ht 63.0 in | Wt 134.6 lb

## 2023-08-15 DIAGNOSIS — E1169 Type 2 diabetes mellitus with other specified complication: Secondary | ICD-10-CM | POA: Insufficient documentation

## 2023-08-15 DIAGNOSIS — E785 Hyperlipidemia, unspecified: Secondary | ICD-10-CM | POA: Insufficient documentation

## 2023-08-15 DIAGNOSIS — Z86711 Personal history of pulmonary embolism: Secondary | ICD-10-CM | POA: Diagnosis present

## 2023-08-15 DIAGNOSIS — Z8673 Personal history of transient ischemic attack (TIA), and cerebral infarction without residual deficits: Secondary | ICD-10-CM | POA: Diagnosis not present

## 2023-08-15 DIAGNOSIS — E088 Diabetes mellitus due to underlying condition with unspecified complications: Secondary | ICD-10-CM | POA: Diagnosis present

## 2023-08-15 DIAGNOSIS — E782 Mixed hyperlipidemia: Secondary | ICD-10-CM | POA: Diagnosis present

## 2023-08-15 NOTE — Patient Instructions (Signed)
 Medication Instructions:  Continue same medications *If you need a refill on your cardiac medications before your next appointment, please call your pharmacy*  Lab Work: None ordered  Testing/Procedures: None ordered  Follow-Up: At [facility], you and your health needs are our priority.  As part of our continuing mission to provide you with exceptional heart care, our providers are all part of one team.  This team includes your primary Cardiologist (physician) and Advanced Practice Providers or APPs (Physician Assistants and Nurse Practitioners) who all work together to provide you with the care you need, when you need it.  Your next appointment:  12 months    Provider:  Dr.Revankar   We recommend signing up for the patient portal called MyChart.  Sign up information is provided on this After Visit Summary.  MyChart is used to connect with patients for Virtual Visits (Telemedicine).  Patients are able to view lab/test results, encounter notes, upcoming appointments, etc.  Non-urgent messages can be sent to your provider as well.   To learn more about what you can do with MyChart, go to [email or web address].
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
